# Patient Record
Sex: Female | Born: 1960 | Race: Black or African American | Hispanic: No | Marital: Married | Smoking: Never smoker
Health system: Southern US, Community
[De-identification: ages and names within clinical notes are randomized; demographics above are authoritative.]

## PROBLEM LIST (undated history)

## (undated) ENCOUNTER — Ambulatory Visit (HOSPITAL_COMMUNITY): Admission: EM | Payer: BC Managed Care – PPO

## (undated) DIAGNOSIS — K760 Fatty (change of) liver, not elsewhere classified: Secondary | ICD-10-CM

## (undated) DIAGNOSIS — K5900 Constipation, unspecified: Secondary | ICD-10-CM

## (undated) DIAGNOSIS — I219 Acute myocardial infarction, unspecified: Secondary | ICD-10-CM

## (undated) DIAGNOSIS — K219 Gastro-esophageal reflux disease without esophagitis: Secondary | ICD-10-CM

## (undated) DIAGNOSIS — M797 Fibromyalgia: Secondary | ICD-10-CM

## (undated) DIAGNOSIS — E785 Hyperlipidemia, unspecified: Secondary | ICD-10-CM

## (undated) DIAGNOSIS — I10 Essential (primary) hypertension: Secondary | ICD-10-CM

## (undated) DIAGNOSIS — T7840XA Allergy, unspecified, initial encounter: Secondary | ICD-10-CM

## (undated) HISTORY — DX: Essential (primary) hypertension: I10

## (undated) HISTORY — PX: ABDOMINAL HYSTERECTOMY: SHX81

## (undated) HISTORY — DX: Fatty (change of) liver, not elsewhere classified: K76.0

## (undated) HISTORY — DX: Acute myocardial infarction, unspecified: I21.9

## (undated) HISTORY — DX: Gastro-esophageal reflux disease without esophagitis: K21.9

## (undated) HISTORY — DX: Hyperlipidemia, unspecified: E78.5

## (undated) HISTORY — PX: WISDOM TOOTH EXTRACTION: SHX21

## (undated) HISTORY — DX: Allergy, unspecified, initial encounter: T78.40XA

## (undated) HISTORY — DX: Constipation, unspecified: K59.00

## (undated) HISTORY — DX: Fibromyalgia: M79.7

---

## 1998-03-24 ENCOUNTER — Inpatient Hospital Stay (HOSPITAL_COMMUNITY): Admission: AD | Admit: 1998-03-24 | Discharge: 1998-03-24 | Payer: Self-pay | Admitting: Obstetrics & Gynecology

## 1998-11-17 ENCOUNTER — Emergency Department (HOSPITAL_COMMUNITY): Admission: EM | Admit: 1998-11-17 | Discharge: 1998-11-17 | Payer: Self-pay | Admitting: Emergency Medicine

## 1998-11-17 ENCOUNTER — Encounter: Payer: Self-pay | Admitting: Emergency Medicine

## 1998-11-19 ENCOUNTER — Emergency Department (HOSPITAL_COMMUNITY): Admission: EM | Admit: 1998-11-19 | Discharge: 1998-11-19 | Payer: Self-pay | Admitting: Internal Medicine

## 1998-11-19 ENCOUNTER — Encounter: Payer: Self-pay | Admitting: Internal Medicine

## 1999-05-16 ENCOUNTER — Encounter: Payer: Self-pay | Admitting: Emergency Medicine

## 1999-05-16 ENCOUNTER — Emergency Department (HOSPITAL_COMMUNITY): Admission: EM | Admit: 1999-05-16 | Discharge: 1999-05-16 | Payer: Self-pay | Admitting: Emergency Medicine

## 2000-08-30 ENCOUNTER — Other Ambulatory Visit: Admission: RE | Admit: 2000-08-30 | Discharge: 2000-08-30 | Payer: Self-pay | Admitting: Obstetrics & Gynecology

## 2000-12-10 ENCOUNTER — Emergency Department (HOSPITAL_COMMUNITY): Admission: EM | Admit: 2000-12-10 | Discharge: 2000-12-10 | Payer: Self-pay | Admitting: Emergency Medicine

## 2000-12-17 ENCOUNTER — Emergency Department (HOSPITAL_COMMUNITY): Admission: EM | Admit: 2000-12-17 | Discharge: 2000-12-17 | Payer: Self-pay | Admitting: Emergency Medicine

## 2002-12-19 ENCOUNTER — Encounter: Admission: RE | Admit: 2002-12-19 | Discharge: 2002-12-19 | Payer: Self-pay

## 2003-01-12 ENCOUNTER — Encounter: Admission: RE | Admit: 2003-01-12 | Discharge: 2003-01-12 | Payer: Self-pay | Admitting: Gastroenterology

## 2003-01-12 ENCOUNTER — Encounter: Payer: Self-pay | Admitting: Gastroenterology

## 2003-02-25 ENCOUNTER — Ambulatory Visit (HOSPITAL_COMMUNITY): Admission: RE | Admit: 2003-02-25 | Discharge: 2003-02-25 | Payer: Self-pay | Admitting: Gastroenterology

## 2004-01-02 ENCOUNTER — Emergency Department (HOSPITAL_COMMUNITY): Admission: EM | Admit: 2004-01-02 | Discharge: 2004-01-02 | Payer: Self-pay | Admitting: Emergency Medicine

## 2004-03-08 ENCOUNTER — Emergency Department (HOSPITAL_COMMUNITY): Admission: EM | Admit: 2004-03-08 | Discharge: 2004-03-08 | Payer: Self-pay | Admitting: Family Medicine

## 2004-03-13 ENCOUNTER — Emergency Department (HOSPITAL_COMMUNITY): Admission: EM | Admit: 2004-03-13 | Discharge: 2004-03-13 | Payer: Self-pay | Admitting: Emergency Medicine

## 2004-03-17 ENCOUNTER — Emergency Department (HOSPITAL_COMMUNITY): Admission: EM | Admit: 2004-03-17 | Discharge: 2004-03-17 | Payer: Self-pay | Admitting: Family Medicine

## 2004-07-30 ENCOUNTER — Emergency Department (HOSPITAL_COMMUNITY): Admission: EM | Admit: 2004-07-30 | Discharge: 2004-07-30 | Payer: Self-pay | Admitting: Emergency Medicine

## 2004-10-15 ENCOUNTER — Encounter: Admission: RE | Admit: 2004-10-15 | Discharge: 2004-10-15 | Payer: Self-pay | Admitting: Neurology

## 2004-12-26 ENCOUNTER — Ambulatory Visit (HOSPITAL_COMMUNITY): Admission: RE | Admit: 2004-12-26 | Discharge: 2004-12-26 | Payer: Self-pay | Admitting: Neurology

## 2005-01-04 ENCOUNTER — Emergency Department (HOSPITAL_COMMUNITY): Admission: EM | Admit: 2005-01-04 | Discharge: 2005-01-04 | Payer: Self-pay | Admitting: Emergency Medicine

## 2006-02-03 ENCOUNTER — Emergency Department (HOSPITAL_COMMUNITY): Admission: EM | Admit: 2006-02-03 | Discharge: 2006-02-03 | Payer: Self-pay | Admitting: Emergency Medicine

## 2006-03-10 ENCOUNTER — Emergency Department (HOSPITAL_COMMUNITY): Admission: EM | Admit: 2006-03-10 | Discharge: 2006-03-10 | Payer: Self-pay | Admitting: Family Medicine

## 2006-03-19 ENCOUNTER — Emergency Department (HOSPITAL_COMMUNITY): Admission: EM | Admit: 2006-03-19 | Discharge: 2006-03-19 | Payer: Self-pay | Admitting: Emergency Medicine

## 2006-03-23 ENCOUNTER — Ambulatory Visit: Payer: Self-pay | Admitting: Cardiology

## 2006-04-06 ENCOUNTER — Ambulatory Visit: Payer: Self-pay

## 2006-12-18 ENCOUNTER — Emergency Department (HOSPITAL_COMMUNITY): Admission: EM | Admit: 2006-12-18 | Discharge: 2006-12-18 | Payer: Self-pay | Admitting: Family Medicine

## 2007-03-25 ENCOUNTER — Ambulatory Visit: Payer: Self-pay | Admitting: Cardiology

## 2007-04-30 ENCOUNTER — Ambulatory Visit: Payer: Self-pay

## 2007-08-13 ENCOUNTER — Emergency Department (HOSPITAL_COMMUNITY): Admission: EM | Admit: 2007-08-13 | Discharge: 2007-08-13 | Payer: Self-pay | Admitting: Emergency Medicine

## 2007-10-20 ENCOUNTER — Emergency Department (HOSPITAL_COMMUNITY): Admission: EM | Admit: 2007-10-20 | Discharge: 2007-10-20 | Payer: Self-pay | Admitting: Emergency Medicine

## 2008-09-01 ENCOUNTER — Emergency Department (HOSPITAL_COMMUNITY): Admission: EM | Admit: 2008-09-01 | Discharge: 2008-09-01 | Payer: Self-pay | Admitting: Emergency Medicine

## 2009-08-20 ENCOUNTER — Encounter: Admission: RE | Admit: 2009-08-20 | Discharge: 2009-08-20 | Payer: Self-pay | Admitting: Gastroenterology

## 2009-09-02 ENCOUNTER — Ambulatory Visit (HOSPITAL_COMMUNITY): Admission: RE | Admit: 2009-09-02 | Discharge: 2009-09-02 | Payer: Self-pay | Admitting: Gastroenterology

## 2009-09-15 HISTORY — PX: COLONOSCOPY: SHX174

## 2009-12-13 ENCOUNTER — Observation Stay (HOSPITAL_COMMUNITY): Admission: EM | Admit: 2009-12-13 | Discharge: 2009-12-14 | Payer: Self-pay | Admitting: Emergency Medicine

## 2010-01-05 ENCOUNTER — Ambulatory Visit: Payer: Self-pay

## 2010-02-07 ENCOUNTER — Telehealth (INDEPENDENT_AMBULATORY_CARE_PROVIDER_SITE_OTHER): Payer: Self-pay | Admitting: *Deleted

## 2010-12-03 ENCOUNTER — Encounter: Payer: Self-pay | Admitting: Neurology

## 2010-12-04 ENCOUNTER — Encounter: Payer: Self-pay | Admitting: Neurology

## 2010-12-13 NOTE — Progress Notes (Signed)
  Faxed all Cardiac over to Joycelyn Schmid w/ [employer] to fax [phone] [facility]  February 07, 2010 9:39 AM

## 2011-01-29 LAB — VITAMIN B12: Vitamin B-12: 258 pg/mL (ref 211–911)

## 2011-01-29 LAB — DIFFERENTIAL
Basophils Absolute: 0 10*3/uL (ref 0.0–0.1)
Basophils Relative: 1 % (ref 0–1)
Eosinophils Absolute: 0.2 10*3/uL (ref 0.0–0.7)
Eosinophils Relative: 6 % — ABNORMAL HIGH (ref 0–5)
Lymphocytes Relative: 53 % — ABNORMAL HIGH (ref 12–46)
Lymphs Abs: 2 10*3/uL (ref 0.7–4.0)
Monocytes Absolute: 0.2 10*3/uL (ref 0.1–1.0)
Monocytes Relative: 6 % (ref 3–12)
Neutro Abs: 1.3 10*3/uL — ABNORMAL LOW (ref 1.7–7.7)
Neutrophils Relative %: 35 % — ABNORMAL LOW (ref 43–77)

## 2011-01-29 LAB — TROPONIN I: Troponin I: 0.05 ng/mL (ref 0.00–0.06)

## 2011-01-29 LAB — CBC
HCT: 31.2 % — ABNORMAL LOW (ref 36.0–46.0)
HCT: 42 % (ref 36.0–46.0)
Hemoglobin: 10.6 g/dL — ABNORMAL LOW (ref 12.0–15.0)
Hemoglobin: 14.1 g/dL (ref 12.0–15.0)
MCHC: 33.6 g/dL (ref 30.0–36.0)
MCV: 92 fL (ref 78.0–100.0)
Platelets: 37 10*3/uL — ABNORMAL LOW (ref 150–400)
RBC: 4.56 MIL/uL (ref 3.87–5.11)
RDW: 13.5 % (ref 11.5–15.5)
RDW: 13.6 % (ref 11.5–15.5)
WBC: 3.8 10*3/uL — ABNORMAL LOW (ref 4.0–10.5)

## 2011-01-29 LAB — IRON AND TIBC
Iron: 51 ug/dL (ref 42–135)
Saturation Ratios: 22 % (ref 20–55)
TIBC: 230 ug/dL — ABNORMAL LOW (ref 250–470)
UIBC: 179 ug/dL

## 2011-01-29 LAB — FOLATE: Folate: 17.1 ng/mL

## 2011-01-29 LAB — POCT CARDIAC MARKERS
CKMB, poc: <1 ng/mL — ABNORMAL LOW (ref 1.0–8.0)
CKMB, poc: <1 ng/mL — ABNORMAL LOW (ref 1.0–8.0)
Myoglobin, poc: 20 ng/mL (ref 12–200)
Myoglobin, poc: 24.8 ng/mL (ref 12–200)
Troponin i, poc: <0.05 ng/mL (ref 0.00–0.09)
Troponin i, poc: <0.05 ng/mL (ref 0.00–0.09)

## 2011-01-29 LAB — FERRITIN: Ferritin: 147 ng/mL (ref 10–291)

## 2011-01-29 LAB — FOLATE RBC: RBC Folate: 648 ng/mL — ABNORMAL HIGH (ref 180–600)

## 2011-01-29 LAB — BASIC METABOLIC PANEL
BUN: 8 mg/dL (ref 6–23)
CO2: 25 mEq/L (ref 19–32)
Calcium: 9.7 mg/dL (ref 8.4–10.5)
Chloride: 107 mEq/L (ref 96–112)
Creatinine, Ser: 0.61 mg/dL (ref 0.4–1.2)
GFR calc Af Amer: >60 mL/min (ref >60)
GFR calc non Af Amer: >60 mL/min (ref >60)
Glucose, Bld: 100 mg/dL — ABNORMAL HIGH (ref 70–99)
Potassium: 3.4 mEq/L — ABNORMAL LOW (ref 3.5–5.1)
Sodium: 141 mEq/L (ref 135–145)

## 2011-01-29 LAB — CK TOTAL AND CKMB (NOT AT ARMC)
CK, MB: 1.9 ng/mL (ref 0.3–4.0)
Relative Index: 0.9 (ref 0.0–2.5)
Total CK: 223 U/L — ABNORMAL HIGH (ref 7–177)

## 2011-01-29 LAB — RAPID URINE DRUG SCREEN, HOSP PERFORMED
Amphetamines: NOT DETECTED
Barbiturates: NOT DETECTED
Benzodiazepines: NOT DETECTED
Cocaine: NOT DETECTED
Opiates: POSITIVE — AB
Tetrahydrocannabinol: NOT DETECTED

## 2011-01-29 LAB — MAGNESIUM: Magnesium: 1.7 mg/dL (ref 1.5–2.5)

## 2011-01-29 LAB — DIRECT ANTIGLOBULIN TEST (NOT AT ARMC): DAT, IgG: NEGATIVE

## 2011-01-29 LAB — TSH: TSH: 1.065 u[IU]/mL (ref 0.350–4.500)

## 2011-01-29 LAB — ANA: Anti Nuclear Antibody(ANA): NEGATIVE

## 2011-01-29 LAB — CARDIAC PANEL(CRET KIN+CKTOT+MB+TROPI): CK, MB: 0.4 ng/mL (ref 0.3–4.0)

## 2011-01-29 LAB — RETICULOCYTES: Retic Count, Absolute: 18.8 10*3/uL — ABNORMAL LOW (ref 19.0–186.0)

## 2011-01-29 LAB — LACTATE DEHYDROGENASE: LDH: 136 U/L (ref 94–250)

## 2011-02-01 LAB — CARDIAC PANEL(CRET KIN+CKTOT+MB+TROPI)
Relative Index: INVALID (ref 0.0–2.5)
Total CK: 51 U/L (ref 7–177)

## 2011-02-01 LAB — COMPREHENSIVE METABOLIC PANEL
ALT: 20 U/L (ref 0–35)
AST: 18 U/L (ref 0–37)
CO2: 27 mEq/L (ref 19–32)
Calcium: 8.4 mg/dL (ref 8.4–10.5)
GFR calc Af Amer: >60 mL/min (ref >60)
Sodium: 137 mEq/L (ref 135–145)
Total Protein: 5.7 g/dL — ABNORMAL LOW (ref 6.0–8.3)

## 2011-03-28 NOTE — Assessment & Plan Note (Signed)
Sherry Daniels                            CARDIOLOGY OFFICE NOTE   Sherry Daniels, Sherry Daniels                      MRN:          [id number]  DATE:03/25/2007                            DOB:          22-May-1961    Sherry Daniels is a very pleasant, 50 year old female who I initially saw in  May 2007 secondary to atypical chest pain and a syncopal episode (felt  secondary to recent initiation of blood pressure medicines and  hypotension). At that time, we scheduled her to have a Myoview which was  performed on Apr 06, 2006. Her ejection fraction was 63%. There was a  mild reversible defect in the distal anterior wall that was felt likely  due to shifting breast attenuation but mild ischemia could not be  excluded. She did return for followup. However she apparently has  recently had more chest pain and we were asked to further evaluate. Note  her chest pain is not exertional nor is it pleuritic or positional or  related to food. She states it is predominantly related to stress. There  is no nausea, vomiting, diaphoresis or shortness of breath. It lasts for  several minutes and resolves spontaneously. Note she does not have  exertional chest pain. She also has some dyspnea on exertion which can  be intermittent. She also complains of intermittent dizziness but no  associated palpitations and there has been no recurrent syncope. Her  blood pressure has been up and down by her report.   MEDICATIONS:  1. Nexium 40 mg p.o. daily.  2. Proventil.  3. Multivitamin.  4. Aspirin daily.   PHYSICAL EXAMINATION:  VITAL SIGNS:  Blood pressure 130/90 and a pulse  of 68. She weighs 164 pounds.  NECK:  Supple with no bruits.  CHEST:  Clear.  CARDIOVASCULAR:  Regular rate and rhythm.  EXTREMITIES:  Show no edema.   Her electrocardiogram  today shows a sinus rhythm at a rate of 68. There  were no significant ST changes noted.   DIAGNOSES:  1. Atypical chest pain - she did  have a Myoview approximately 1 year      ago that showed a question of mild anterior ischemia although it is      felt most likely to be more related to a shifting breast      attenuation. We will plan to repeat that. If it shows no ischemia      then we will not pursue further cardiac workup. I think her      symptoms are very atypical for ischemia.  2. Probably hypertension - her blood pressure is mildly elevated today      and she states it is starting to trend up. We talked about risk      factor modification today including diet, exercise and avoiding      sodium. She will begin with these measures. If her blood pressure      remains elevated then she will most likely need low dose      antihypertensive and I will leave that to her primary care  physician.  3. Gastroesophageal reflux disease.  4. Irritable bowel syndrome.  5. Fibromyalgia - per her primary care physician.   We will see her back on a p.r.n. basis pending the results of her  Myoview.     Sherry Frieze Jens Som, MD, [facility]  Electronically Signed    BSC/MedQ  DD: 03/25/2007  DT: 03/25/2007  Job #: [id number]   cc:   Olena Leatherwood [facility]

## 2011-03-31 NOTE — Op Note (Signed)
NAME:  Sherry Daniels, Sherry Daniels                         ACCOUNT NO.:  [id number]   MEDICAL RECORD NO.:  [id number]                   PATIENT TYPE:  AMB   LOCATION:  ENDO                                 FACILITY:  [facility]   PHYSICIAN:  Anselmo Rod, M.D.               DATE OF BIRTH:  11-Aug-1961   DATE OF PROCEDURE:  02/25/2003  DATE OF DISCHARGE:                                 OPERATIVE REPORT   PROCEDURE:  Screening colonoscopy.   ENDOSCOPIST:  Anselmo Rod, M.D.   INSTRUMENT USED:  Olympus video colonoscope (adjustable pediatric).   INDICATION FOR PROCEDURE:  A 50 year old African-American female with a  history of colon cancer and breast caner in the family and guaiac-positive  stools.  Rule out colonic polyps, masses, etc.   PREPROCEDURE PREPARATION:  Informed consent was procured from the patient.  The patient fasted for eight hours prior to the procedure and prepped with a  bottle of Gatorade and Miralax the night prior to the procedure.   PREPROCEDURE PHYSICAL:  VITAL SIGNS:  The patient had stable vital signs.  NECK:  Supple.  CHEST:  Clear to auscultation.  S1, S2 regular.  ABDOMEN:  Soft with normal bowel sounds.   DESCRIPTION OF PROCEDURE:  The patient was placed in the left lateral  decubitus position and sedated with 100 mg of Demerol and 10 mg of Versed  intravenously.  Once the patient was adequately sedate and maintained on low-  flow oxygen and continuous cardiac monitoring, the Olympus video colonoscope  was advanced form the rectum to the cecum and terminal ileum without  difficulty.  The patient had a healthy-appearing colon with no masses,  polyps, erosions, ulcerations, or diverticula.  A small internal hemorrhoid  was seen on retroflexion in the rectum.  No other source of bleeding could  be identified.  The patient tolerated the procedure well without  complication.   IMPRESSION:  Normal colonoscopy up to the terminal ileum except for small,  nonbleeding  internal hemorrhoid.   RECOMMENDATIONS:  1. Considering her family history of colon cancer, repeat colorectal cancer     screening is recommended in the next     five years unless the patient develops any abnormal symptoms in the     interim.  2. Outpatient follow-up for repeat guaiac testing.  3. Further evaluation for dysphagia will be done on an outpatient basis as     well.                                                 Anselmo Rod, M.D.    JNM/MEDQ  D:  02/25/2003  T:  02/25/2003  Job:  [id number]   cc:   Christella Noa, M.D.  [address] [place]  [place], [place] [address]  Fax: [phone]

## 2011-05-08 ENCOUNTER — Inpatient Hospital Stay (INDEPENDENT_AMBULATORY_CARE_PROVIDER_SITE_OTHER)
Admission: RE | Admit: 2011-05-08 | Discharge: 2011-05-08 | Disposition: A | Discharge status: Home / Self Care | Payer: Self-pay | Source: Ambulatory Visit | Attending: Family Medicine | Admitting: Family Medicine

## 2011-05-08 DIAGNOSIS — IMO0001 Reserved for inherently not codable concepts without codable children: Secondary | ICD-10-CM

## 2011-05-15 ENCOUNTER — Encounter: Payer: Self-pay | Admitting: Family Medicine

## 2011-05-15 DIAGNOSIS — I10 Essential (primary) hypertension: Secondary | ICD-10-CM | POA: Insufficient documentation

## 2011-05-15 DIAGNOSIS — T7840XA Allergy, unspecified, initial encounter: Secondary | ICD-10-CM | POA: Insufficient documentation

## 2011-08-14 LAB — POCT I-STAT, CHEM 8
Calcium, Ion: 1.27
Chloride: 107
Glucose, Bld: 90
HCT: 35 — ABNORMAL LOW
Hemoglobin: 11.9 — ABNORMAL LOW

## 2011-08-14 LAB — DIFFERENTIAL
Basophils Absolute: 0.1
Basophils Relative: 1
Eosinophils Relative: 4
Monocytes Absolute: 0.3

## 2011-08-14 LAB — CBC
HCT: 34.4 — ABNORMAL LOW
Hemoglobin: 11.5 — ABNORMAL LOW
MCHC: 33.4
RDW: 13.2

## 2011-08-16 ENCOUNTER — Inpatient Hospital Stay (HOSPITAL_COMMUNITY)
Admission: RE | Admit: 2011-08-16 | Discharge: 2011-08-16 | Disposition: A | Discharge status: Home / Self Care | Payer: Self-pay | Source: Ambulatory Visit | Attending: Family Medicine | Admitting: Family Medicine

## 2011-08-21 LAB — DIFFERENTIAL
Basophils Relative: 1
Eosinophils Absolute: 0.1 — ABNORMAL LOW
Eosinophils Relative: 2
Monocytes Absolute: 0.4
Monocytes Relative: 7
Neutrophils Relative %: 53

## 2011-08-21 LAB — CBC
HCT: 36.2
MCHC: 33.4
MCV: 90.1
RBC: 4.01

## 2011-08-21 LAB — BASIC METABOLIC PANEL
CO2: 28
Chloride: 101
Creatinine, Ser: 0.73
GFR calc Af Amer: >60

## 2011-08-22 ENCOUNTER — Inpatient Hospital Stay (INDEPENDENT_AMBULATORY_CARE_PROVIDER_SITE_OTHER)
Admission: RE | Admit: 2011-08-22 | Discharge: 2011-08-22 | Disposition: A | Discharge status: Home / Self Care | Payer: Self-pay | Source: Ambulatory Visit | Attending: Family Medicine | Admitting: Family Medicine

## 2011-08-22 DIAGNOSIS — M25519 Pain in unspecified shoulder: Secondary | ICD-10-CM

## 2011-08-22 DIAGNOSIS — S40019A Contusion of unspecified shoulder, initial encounter: Secondary | ICD-10-CM

## 2011-10-12 ENCOUNTER — Encounter: Payer: Self-pay | Admitting: Medical

## 2011-10-12 ENCOUNTER — Ambulatory Visit (INDEPENDENT_AMBULATORY_CARE_PROVIDER_SITE_OTHER): Payer: BC Managed Care – PPO | Admitting: Medical

## 2011-10-12 VITALS — BP 120/70 | HR 68 | Temp 98.1°F | Resp 16 | Wt 148.0 lb

## 2011-10-12 DIAGNOSIS — E782 Mixed hyperlipidemia: Secondary | ICD-10-CM | POA: Insufficient documentation

## 2011-10-12 DIAGNOSIS — IMO0001 Reserved for inherently not codable concepts without codable children: Secondary | ICD-10-CM

## 2011-10-12 DIAGNOSIS — M797 Fibromyalgia: Secondary | ICD-10-CM

## 2011-10-12 DIAGNOSIS — I10 Essential (primary) hypertension: Secondary | ICD-10-CM | POA: Insufficient documentation

## 2011-10-12 DIAGNOSIS — M2550 Pain in unspecified joint: Secondary | ICD-10-CM

## 2011-10-12 DIAGNOSIS — E785 Hyperlipidemia, unspecified: Secondary | ICD-10-CM

## 2011-10-12 DIAGNOSIS — K219 Gastro-esophageal reflux disease without esophagitis: Secondary | ICD-10-CM | POA: Insufficient documentation

## 2011-10-12 MED ORDER — DEXLANSOPRAZOLE 60 MG PO CPDR: 60.0000 mg | DELAYED_RELEASE_CAPSULE | Freq: Every day | ORAL | Status: DC

## 2011-10-12 MED ORDER — DULOXETINE HCL 60 MG PO CPEP: 60.0000 mg | ORAL_CAPSULE | Freq: Every day | ORAL | Status: DC

## 2011-10-12 MED ORDER — PRAVASTATIN SODIUM 40 MG PO TABS: 40.0000 mg | ORAL_TABLET | Freq: Every day | ORAL | Status: DC

## 2011-10-12 MED ORDER — HYDROCHLOROTHIAZIDE 12.5 MG PO CAPS: 12.5000 mg | ORAL_CAPSULE | Freq: Every day | ORAL | Status: DC

## 2011-10-12 NOTE — Progress Notes (Signed)
Subjective:   HPI  Sherry Daniels is a 50 y.o. female who presents as a new patient.  Was seeing [facility] prior.  She notes hx/o fluctuating blood pressure.  She can go routinely with normal pressure and then can have fluctuations that are high.  She has been out of her medication for 2 weeks and it has been normal the last 2 weeks.  She notes that BP can be up to 150/110 at times, but with stress.  Needs refills on her other medications as well.  She also notes hx/o fibromyalgia.  Last PCM put her on Xanax prn for fibromyalgia, but she doesn't use this regularly.  She aches all over.    She does have a lot of stress.  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   Lately been having pains in low back, pain in both knees, and some pain in both hips x months. She exercises regularly.  She note bad fall at [employer] in October.  Been having low back and buttock pain since then.  Runs 2-3 miles daily.  No other aggravating or relieving factors.    No other c/o.  The following portions of the patient's history were reviewed and updated as appropriate: allergies, current medications, past family history, past medical history, past social history, past surgical history and problem list.  Past Medical History  Diagnosis Date  . Allergy   . Hypertension   . Hyperlipidemia   . Asthma   . Migraine   . Nonalcoholic fatty liver disease   . GERD (gastroesophageal reflux disease)   . Fibromyalgia   . Chest pain 11/2009    overnight hospitalization   Review of Systems Constitutional: -fever, -chills, -sweats, -unexpected -weight change,-fatigue ENT: -runny nose, -ear pain, -sore throat Cardiology:  -chest pain, -palpitations, -edema Respiratory: -cough, -shortness of breath, -wheezing Gastroenterology: -abdominal pain, -nausea, -vomiting, -diarrhea, +constipation Hematology: -bleeding or bruising problems Musculoskeletal:  -arthralgias, -myalgias, -joint swelling, +back pain Ophthalmology: -vision changes Urology: -dysuria, -difficulty urinating, -hematuria, -urinary frequency, -urgency Neurology: -headache, -weakness, +tingling, +numbness    Objective:   Physical Exam  Filed Vitals:   10/12/11 1531  BP: 120/70  Pulse: 68  Temp: 98.1 F (36.7 C)  Resp: 16    General appearance: alert, no distress, WD/WN, black female, looks younger than stated age  Oral cavity: MMM, no lesions Neck: supple, no lymphadenopathy, no thyromegaly, no masses, no bruits Heart: RRR, normal S1, S2, no murmurs Lungs: CTA bilaterally, no wheezes, rhonchi, or rales Abdomen: +bs, soft, non tender, non distended, no masses, no hepatomegaly, no splenomegaly Pulses: 2+ symmetric, upper and lower extremities, normal cap refill MSK:    Assessment and Plan :    Encounter Diagnoses  Name Primary?  . Essential hypertension, benign Yes  . Hyperlipidemia   . GERD (gastroesophageal reflux disease)   . Fibromyalgia   . Polyarthralgia    HTN - controlled, refilled medication  Hyperlipidemia - c/t same medication, records request from prior PCM  GERD - c/t same medications  Fibomroyaliga - trial of Cymbalta.   Gave symptoms of 30mg  trial for 1 week, then increase to 60mg  daily.   Recheck 58mo.  Polyarthralgia - discussed possible etiologies.  She does have family hx/o RA.  We will request prior records.  For now begin Aleve and Capsaicin cream OTC.  C/t regularly exercise, but don't run eeveryday.   Some of her pain could rrepresentoveruse  injury in regards to knees and pelvis, but can't rule out other etiology.  Follow-up 41mo.

## 2011-10-12 NOTE — Patient Instructions (Addendum)
Begin Aleve OTC once to twice daily for arthritis.  Consider OTC Capsacian cream topically for joint pains.  Continue regular exercise.    We will request prior records.    Lets see you back in 1 month for recheck.   Fibromyalgia Fibromyalgia is a disorder that is often misunderstood. It is associated with muscular pains and tenderness that comes and goes. It is often associated with fatigue and sleep disturbances. Though it tends to be long-lasting, fibromyalgia is not life-threatening. CAUSES  The exact cause of fibromyalgia is unknown. People with certain gene types are predisposed to developing fibromyalgia and other conditions. Certain factors can play a role as triggers, such as:  Spine disorders.   Arthritis.   Severe injury (trauma) and other physical stressors.   Emotional stressors.  SYMPTOMS   The main symptom is pain and stiffness in the muscles and joints, which can vary over time.   Sleep and fatigue problems.  Other related symptoms may include:  Bowel and bladder problems.   Headaches.   Visual problems.   Problems with odors and noises.   Depression or mood changes.   Painful periods (dysmenorrhea).   Dryness of the skin or eyes.  DIAGNOSIS  There are no specific tests for diagnosing fibromyalgia. Patients can be diagnosed accurately from the specific symptoms they have. The diagnosis is made by determining that nothing else is causing the problems. TREATMENT  There is no cure. Management includes medicines and an active, healthy lifestyle. The goal is to enhance physical fitness, decrease pain, and improve sleep. HOME CARE INSTRUCTIONS   Only take over-the-counter or prescription medicines as directed by your caregiver. Sleeping pills, tranquilizers, and pain medicines may make your problems worse.   Low-impact aerobic exercise is very important and advised for treatment. At first, it may seem to make pain worse. Gradually increasing your tolerance will  overcome this feeling.   Learning relaxation techniques and how to control stress will help you. Biofeedback, visual imagery, hypnosis, muscle relaxation, yoga, and meditation are all options.   Anti-inflammatory medicines and physical therapy may provide short-term help.   Acupuncture or massage treatments may help.   Take muscle relaxant medicines as suggested by your caregiver.   Avoid stressful situations.   Plan a healthy lifestyle. This includes your diet, sleep, rest, exercise, and friends.   Find and practice a hobby you enjoy.   Join a fibromyalgia support group for interaction, ideas, and sharing advice. This may be helpful.  SEEK MEDICAL CARE IF:  You are not having good results or improvement from your treatment. FOR MORE INFORMATION  National Fibromyalgia Association: www.fmaware.org Arthritis Foundation: www.arthritis.org Document Released: 10/30/2005 Document Revised: 07/12/2011 Document Reviewed: 02/09/2010 [facility] Patient Information 2012 [place], [place].

## 2011-10-23 ENCOUNTER — Other Ambulatory Visit: Payer: Self-pay | Admitting: Medical

## 2011-10-23 ENCOUNTER — Telehealth: Payer: Self-pay | Admitting: Family Medicine

## 2011-10-23 MED ORDER — ESOMEPRAZOLE MAGNESIUM 40 MG PO CPDR: DELAYED_RELEASE_CAPSULE | ORAL | Status: DC

## 2011-10-23 NOTE — Telephone Encounter (Signed)
SHANE, I SPOKE WITH THE PATIENT AND SHE STATES THAT SHE TAKEN NEXIUM BEFORE SO SHE WOULD BE OKAY WITH TAKING NEXIUM AGAIN. COULD YOU PLEASE SEND THE RX TO HER PHARMACY. CLS

## 2011-10-23 NOTE — Telephone Encounter (Signed)
Message copied by Janeice Robinson on Mon Oct 23, 2011  8:31 AM ------      Message from: Aleen Campi, DAVID S      Created: Thu Oct 19, 2011  3:22 PM       Insurance is declining to cover Dexilant.  She just came recently as a new patient.   What else has she been on prior?  Insurance is wanting to pay for Nexium instead.  Has she been on this?  Is she ok with trying Nexium?

## 2011-11-02 ENCOUNTER — Telehealth: Payer: Self-pay | Admitting: Family Medicine

## 2011-11-06 ENCOUNTER — Encounter: Payer: Self-pay | Admitting: Internal Medicine

## 2011-11-08 ENCOUNTER — Encounter: Payer: Self-pay | Admitting: Medical

## 2011-11-08 ENCOUNTER — Ambulatory Visit (INDEPENDENT_AMBULATORY_CARE_PROVIDER_SITE_OTHER): Payer: BC Managed Care – PPO | Admitting: Medical

## 2011-11-08 VITALS — BP 110/70 | HR 80 | Temp 98.2°F | Resp 12 | Wt 144.0 lb

## 2011-11-08 DIAGNOSIS — K219 Gastro-esophageal reflux disease without esophagitis: Secondary | ICD-10-CM

## 2011-11-08 DIAGNOSIS — M797 Fibromyalgia: Secondary | ICD-10-CM

## 2011-11-08 DIAGNOSIS — E785 Hyperlipidemia, unspecified: Secondary | ICD-10-CM

## 2011-11-08 DIAGNOSIS — K7689 Other specified diseases of liver: Secondary | ICD-10-CM

## 2011-11-08 DIAGNOSIS — K760 Fatty (change of) liver, not elsewhere classified: Secondary | ICD-10-CM | POA: Insufficient documentation

## 2011-11-08 DIAGNOSIS — I10 Essential (primary) hypertension: Secondary | ICD-10-CM

## 2011-11-08 DIAGNOSIS — IMO0001 Reserved for inherently not codable concepts without codable children: Secondary | ICD-10-CM

## 2011-11-08 DIAGNOSIS — M2550 Pain in unspecified joint: Secondary | ICD-10-CM

## 2011-11-08 MED ORDER — AMITRIPTYLINE HCL 10 MG PO TABS: 10.0000 mg | ORAL_TABLET | Freq: Every day | ORAL | Status: DC

## 2011-11-08 NOTE — Progress Notes (Signed)
Subjective:   HPI  Sherry Daniels is a 50 y.o. female who presents for recheck.  I saw her recently as a new patient for multiple concerns.  Was seeing [facility] prior.  She also notes hx/o fibromyalgia.  Last PCM put her on Xanax prn for fibromyalgia, but she doesn't use this regularly.  She aches all over. Last visit I advised trial of Cymbalta but she decided not to use this since her brother apparently had suicidal ideation on this prior.  She does have a lot of stress.  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   Lately been having pains in low back, pain in both knees, and some pain in both hips x months. She exercises regularly.  She note bad fall at [employer] in October.  Been having low back and buttock pain since then.  Runs 2-3 miles daily.  No other aggravating or relieving factors.  Since last visit has been using Aleve BID and Capsaicin cream which helps.  She has also used muscle relaxers in the past for myalgias.      In general, her last mammogram was 05/2010.  Last pap smear 2 years ago.  Sees Dr. Molly Maduro Wean/gynecology.    She is concerned about Lupus or other autoimmune issues.  She thinks she has several lupus symptoms including fatigue, subjective fevers, weight loss, joint pains including knees, elbows, wrists bilat, gets tingling in fingers and toes, rash on face.  She notes hx/o migraines, and still gets headaches regularly.  She wonders about environmental exposures growing up.  She notes that other girls her age that grew up in her neighborhood all had things wrong with them including MS, or other more rare conditions.    No other aggravating or relieving factors.    No other c/o.  The following portions of the patient's history were reviewed and updated as appropriate: allergies, current medications, past family history, past medical history, past social history, past surgical history and problem  list.  Past Medical History  Diagnosis Date  . Allergy   . Hypertension   . Hyperlipidemia   . Asthma   . Migraine   . Nonalcoholic fatty liver disease   . GERD (gastroesophageal reflux disease)   . Fibromyalgia   . Chest pain 11/2009    overnight hospitalization    Review of Systems Constitutional: -fever, -chills, +sweats, -unexpected -weight change,+fatigue ENT: -runny nose, -ear pain, -sore throat Cardiology:  -chest pain, -palpitations, -edema Respiratory: -cough, -shortness of breath, -wheezing Gastroenterology: -abdominal pain, -nausea, -vomiting, -diarrhea, -constipation Hematology: -bleeding or bruising problems Musculoskeletal: +arthralgias, +myalgias, -joint swelling, +back pain Ophthalmology: +vision changes Urology: -dysuria, -difficulty urinating, -hematuria, -urinary frequency, -urgency Neurology: +headache, -weakness, -tingling, -numbness    Objective:   Physical Exam  Filed Vitals:   11/08/11 1409  BP: 110/70  Pulse: 80  Temp: 98.2 F (36.8 C)  Resp: 12    General appearance: alert, no distress, WD/WN, lean black female HEENT: normocephalic, sclerae anicteric, TMs pearly, nares patent, no discharge or erythema, pharynx normal Oral cavity: MMM, no lesions Neck: supple, no lymphadenopathy, no thyromegaly, no masses, no bruits Heart: RRR, normal S1, S2, no murmurs Lungs: CTA bilaterally, no wheezes, rhonchi, or rales Abdomen: +bs, soft, non tender, non distended, no masses, no hepatomegaly, no splenomegaly Back: tender throughout, but no scoliosis MSK: mild generalized tenderness, bony abnormality of left medial knee, but otherwise no joint swelling, no asymmetry  or obvious deformity Pulses: 2+ symmetric, upper and lower extremities, normal cap refill Neuro: CN2-12 intact, nonfocal exam  Assessment and Plan :    Encounter Diagnoses  Name Primary?  . Hyperlipidemia Yes  . Essential hypertension, benign   . Nonalcoholic fatty liver disease   .  Polyarthralgia   . Fibromyalgia   . GERD (gastroesophageal reflux disease)    I reviewed her recent records that came in.   Hyperlipidemia - return for fasting labs, c/t same medications  HTN - controlled on current medication  Fatty liver disease - labs today  fibromyalgia and migraines - trial of Amitriptyline.  C/t regular exercise.   Polyarthralgia - Sed rate and ANA today.  GERD using Nexium and doing ok on this.  Follow-up tomorrow for fasting labs.

## 2011-11-09 ENCOUNTER — Other Ambulatory Visit: Payer: BC Managed Care – PPO

## 2011-11-10 ENCOUNTER — Other Ambulatory Visit (INDEPENDENT_AMBULATORY_CARE_PROVIDER_SITE_OTHER): Payer: BC Managed Care – PPO

## 2011-11-10 DIAGNOSIS — K760 Fatty (change of) liver, not elsewhere classified: Secondary | ICD-10-CM

## 2011-11-10 DIAGNOSIS — K7689 Other specified diseases of liver: Secondary | ICD-10-CM

## 2011-11-10 DIAGNOSIS — M2550 Pain in unspecified joint: Secondary | ICD-10-CM

## 2011-11-10 DIAGNOSIS — E785 Hyperlipidemia, unspecified: Secondary | ICD-10-CM

## 2011-11-10 DIAGNOSIS — I10 Essential (primary) hypertension: Secondary | ICD-10-CM

## 2011-11-10 LAB — POCT URINALYSIS DIPSTICK
Bilirubin, UA: NEGATIVE
Blood, UA: NEGATIVE
Glucose, UA: NEGATIVE
Ketones, UA: NEGATIVE
Spec Grav, UA: 1.015

## 2011-11-11 LAB — LIPID PANEL
Cholesterol: 162 mg/dL (ref 0–200)
HDL: 66 mg/dL (ref >39)
Total CHOL/HDL Ratio: 2.5 Ratio
VLDL: 16 mg/dL (ref 0–40)

## 2011-11-11 LAB — COMPREHENSIVE METABOLIC PANEL
AST: 20 U/L (ref 0–37)
Albumin: 3.8 g/dL (ref 3.5–5.2)
Alkaline Phosphatase: 31 U/L — ABNORMAL LOW (ref 39–117)
BUN: 13 mg/dL (ref 6–23)
Calcium: 8.7 mg/dL (ref 8.4–10.5)
Creat: 0.56 mg/dL (ref 0.50–1.10)
Glucose, Bld: 84 mg/dL (ref 70–99)
Potassium: 3.7 mEq/L (ref 3.5–5.3)

## 2011-11-11 LAB — CBC WITH DIFFERENTIAL/PLATELET
HCT: 35.6 % — ABNORMAL LOW (ref 36.0–46.0)
Hemoglobin: 11.4 g/dL — ABNORMAL LOW (ref 12.0–15.0)
Lymphs Abs: 1.6 10*3/uL (ref 0.7–4.0)
MCH: 29.9 pg (ref 26.0–34.0)
Monocytes Absolute: 0.2 10*3/uL (ref 0.1–1.0)
Monocytes Relative: 7 % (ref 3–12)
Neutro Abs: 0.9 10*3/uL — ABNORMAL LOW (ref 1.7–7.7)
Neutrophils Relative %: 32 % — ABNORMAL LOW (ref 43–77)
RBC: 3.81 MIL/uL — ABNORMAL LOW (ref 3.87–5.11)

## 2011-11-11 LAB — TSH: TSH: 0.999 u[IU]/mL (ref 0.350–4.500)

## 2011-11-13 ENCOUNTER — Telehealth: Payer: Self-pay | Admitting: Internal Medicine

## 2011-11-13 NOTE — Telephone Encounter (Signed)
Left message to call pt back

## 2011-11-13 NOTE — Telephone Encounter (Signed)
Message copied by Joslyn Hy on Mon Nov 13, 2011  9:54 AM ------      Message from: Jac Canavan      Created: Sat Nov 11, 2011  6:59 AM       Her labs show anemia - low white and red blood cells, low hemoglobin.  Her urine had trace protein.  Otherwise ALL other labs were normal.  Lets have her f/u at her convenience within the next week or so for recheck and additional labs regarding the anemia.              FYI for shane - colonoscopy 2010, prior EGD, followed by Dr. Loreta Ave, sees gyn, consider additional RA and autoimmune workup, anemia workup

## 2011-11-17 NOTE — Telephone Encounter (Signed)
PT WAS SWITCHED TO NEXIUM FOR INS TO PAY-LM

## 2011-11-20 ENCOUNTER — Other Ambulatory Visit: Payer: Self-pay

## 2011-11-20 ENCOUNTER — Emergency Department (HOSPITAL_COMMUNITY): Payer: BC Managed Care – PPO

## 2011-11-20 ENCOUNTER — Encounter (HOSPITAL_COMMUNITY): Payer: Self-pay

## 2011-11-20 ENCOUNTER — Emergency Department (HOSPITAL_COMMUNITY)
Admission: EM | Admit: 2011-11-20 | Discharge: 2011-11-20 | Disposition: A | Discharge status: Home / Self Care | Payer: BC Managed Care – PPO | Attending: Emergency Medicine | Admitting: Emergency Medicine

## 2011-11-20 DIAGNOSIS — R0789 Other chest pain: Secondary | ICD-10-CM | POA: Insufficient documentation

## 2011-11-20 DIAGNOSIS — E785 Hyperlipidemia, unspecified: Secondary | ICD-10-CM | POA: Insufficient documentation

## 2011-11-20 DIAGNOSIS — I10 Essential (primary) hypertension: Secondary | ICD-10-CM | POA: Insufficient documentation

## 2011-11-20 DIAGNOSIS — J45909 Unspecified asthma, uncomplicated: Secondary | ICD-10-CM | POA: Insufficient documentation

## 2011-11-20 DIAGNOSIS — IMO0001 Reserved for inherently not codable concepts without codable children: Secondary | ICD-10-CM | POA: Insufficient documentation

## 2011-11-20 DIAGNOSIS — K219 Gastro-esophageal reflux disease without esophagitis: Secondary | ICD-10-CM | POA: Insufficient documentation

## 2011-11-20 LAB — COMPREHENSIVE METABOLIC PANEL
ALT: 25 U/L (ref 0–35)
AST: 19 U/L (ref 0–37)
Albumin: 3.5 g/dL (ref 3.5–5.2)
Alkaline Phosphatase: 47 U/L (ref 39–117)
CO2: 30 mEq/L (ref 19–32)
Chloride: 99 mEq/L (ref 96–112)
Creatinine, Ser: 0.64 mg/dL (ref 0.50–1.10)
GFR calc non Af Amer: >90 mL/min (ref >90)
Potassium: 3.2 mEq/L — ABNORMAL LOW (ref 3.5–5.1)
Sodium: 137 mEq/L (ref 135–145)
Total Bilirubin: 0.2 mg/dL — ABNORMAL LOW (ref 0.3–1.2)

## 2011-11-20 LAB — CBC
HCT: 33.3 % — ABNORMAL LOW (ref 36.0–46.0)
Hemoglobin: 11.2 g/dL — ABNORMAL LOW (ref 12.0–15.0)
MCH: 30.3 pg (ref 26.0–34.0)
MCHC: 33.6 g/dL (ref 30.0–36.0)
MCV: 90 fL (ref 78.0–100.0)
Platelets: 203 10*3/uL (ref 150–400)
RBC: 3.7 MIL/uL — ABNORMAL LOW (ref 3.87–5.11)
RDW: 12.4 % (ref 11.5–15.5)
WBC: 4.9 10*3/uL (ref 4.0–10.5)

## 2011-11-20 LAB — CARDIAC PANEL(CRET KIN+CKTOT+MB+TROPI)
CK, MB: 1.3 ng/mL (ref 0.3–4.0)
Relative Index: INVALID (ref 0.0–2.5)
Total CK: 57 U/L (ref 7–177)
Troponin I: <0.3 ng/mL (ref <0.30)

## 2011-11-20 LAB — DIFFERENTIAL
Basophils Absolute: 0 10*3/uL (ref 0.0–0.1)
Basophils Relative: 1 % (ref 0–1)
Lymphocytes Relative: 46 % (ref 12–46)
Monocytes Absolute: 0.3 10*3/uL (ref 0.1–1.0)
Neutro Abs: 2.1 10*3/uL (ref 1.7–7.7)
Neutrophils Relative %: 44 % (ref 43–77)

## 2011-11-20 MED ORDER — ALBUTEROL SULFATE HFA 108 (90 BASE) MCG/ACT IN AERS: 2.0000 | INHALATION_SPRAY | Freq: Once | RESPIRATORY_TRACT | Status: DC

## 2011-11-20 NOTE — ED Notes (Signed)
Pt complains of chest tightness since last night

## 2011-11-20 NOTE — ED Provider Notes (Signed)
History     CSN: [id number]  Arrival date & time 11/20/11  1630   First MD Initiated Contact with Patient 11/20/11 2007     8:21 PM HPI Patient reports last night developed a chest tightness, and a chest pressure. Reports chest pressure was constant from is 24 hours. Reports chest tightness was waxing and waning. Denies any other associated symptoms such as shortness of breath, cough, nausea, vomiting, diaphoresis. Reports pain was nonreproducible with eating or exertion. Denies excessive exertion. Denies history of hormone therapy, recent trips, surgery, history of PE, or tachycardia. Patient is a 51 y.o. female presenting with chest pain. The history is provided by the patient.  Chest Pain The chest pain began yesterday. Chest pain occurs constantly. The chest pain is resolved. At its most intense, the pain is at 8/10. The pain is currently at 0/10. The severity of the pain is severe. The quality of the pain is described as tightness and pressure-like. The pain does not radiate. Chest pain is worsened by deep breathing (palpation). Pertinent negatives for primary symptoms include no fever, no fatigue, no shortness of breath, no cough, no wheezing, no palpitations, no abdominal pain, no nausea, no vomiting and no dizziness.  Pertinent negatives for associated symptoms include no claudication, no diaphoresis, no lower extremity edema, no numbness, no orthopnea and no weakness. She tried aspirin for the symptoms.  Her past medical history is significant for hyperlipidemia and hypertension.  Pertinent negatives for past medical history include no cancer, no diabetes, no MI, no PE, no strokes and no TIA.  Her family medical history is significant for heart disease in family, early MI in family and PE in family.     Past Medical History  Diagnosis Date  . Allergy   . Hypertension   . Hyperlipidemia   . Asthma   . Migraine   . Nonalcoholic fatty liver disease   . GERD (gastroesophageal reflux  disease)   . Fibromyalgia   . Chest pain 11/2009    overnight hospitalization    Past Surgical History  Procedure Date  . Abdominal hysterectomy     total  . Wisdom tooth extraction     History reviewed. No pertinent family history.  History  Substance Use Topics  . Smoking status: Never Smoker   . Smokeless tobacco: Not on file  . Alcohol Use: No    OB History    Grav Para Term Preterm Abortions TAB SAB Ect Mult Living                  Review of Systems  Constitutional: Negative for fever, diaphoresis and fatigue.  HENT: Negative for neck pain.   Respiratory: Negative for cough, shortness of breath and wheezing.   Cardiovascular: Positive for chest pain. Negative for palpitations, orthopnea, claudication and leg swelling.  Gastrointestinal: Negative for nausea, vomiting and abdominal pain.  Neurological: Negative for dizziness, weakness, numbness and headaches.  All other systems reviewed and are negative.    Allergies  Aspirin and Simvastatin  Home Medications   Current Outpatient Rx  Name Route Sig Dispense Refill  . ASPIRIN 325 MG PO TABS Oral Take 650 mg by mouth daily as needed. pain     . ESOMEPRAZOLE MAGNESIUM 40 MG PO CPDR  1 tablet 45 min before breakfast daily 30 capsule 5  . HYDROCHLOROTHIAZIDE 12.5 MG PO CAPS Oral Take 1 capsule (12.5 mg total) by mouth daily. 30 capsule 3  . NORTRIPTYLINE HCL 10 MG PO CAPS Oral Take  10 mg by mouth at bedtime.      Marland Kitchen PRAVASTATIN SODIUM 40 MG PO TABS Oral Take 1 tablet (40 mg total) by mouth daily. 30 tablet 3    BP 111/71  Pulse 70  Temp(Src) 98.1 F (36.7 C) (Oral)  Resp 18  SpO2 100%  Physical Exam  Vitals reviewed. Constitutional: She is oriented to person, place, and time. Vital signs are normal. She appears well-developed and well-nourished.  HENT:  Head: Normocephalic and atraumatic.  Eyes: Conjunctivae are normal. Pupils are equal, round, and reactive to light.  Neck: Normal range of motion. Neck  supple.  Cardiovascular: Normal rate, regular rhythm and normal heart sounds.  Exam reveals no friction rub.   No murmur heard. Pulmonary/Chest: Effort normal and breath sounds normal. She has no wheezes. She has no rhonchi. She has no rales. She exhibits tenderness ( left substernal tenderness with palpation is mild).  Abdominal: Soft. Bowel sounds are normal. She exhibits no distension and no mass. There is no tenderness. There is no rebound and no guarding.  Musculoskeletal: Normal range of motion.  Neurological: She is alert and oriented to person, place, and time. Coordination normal.  Skin: Skin is warm and dry. No rash noted. No erythema. No pallor.    ED Course  Procedures  Results for orders placed during the hospital encounter of 11/20/11  CBC      Component Value Range   WBC 4.9  4.0 - 10.5 (K/uL)   RBC 3.70 (*) 3.87 - 5.11 (MIL/uL)   Hemoglobin 11.2 (*) 12.0 - 15.0 (g/dL)   HCT 40.9 (*) 81.1 - 46.0 (%)   MCV 90.0  78.0 - 100.0 (fL)   MCH 30.3  26.0 - 34.0 (pg)   MCHC 33.6  30.0 - 36.0 (g/dL)   RDW 91.4  78.2 - 95.6 (%)   Platelets 203  150 - 400 (K/uL)  DIFFERENTIAL      Component Value Range   Neutrophils Relative 44  43 - 77 (%)   Neutro Abs 2.1  1.7 - 7.7 (K/uL)   Lymphocytes Relative 46  12 - 46 (%)   Lymphs Abs 2.3  0.7 - 4.0 (K/uL)   Monocytes Relative 7  3 - 12 (%)   Monocytes Absolute 0.3  0.1 - 1.0 (K/uL)   Eosinophils Relative 3  0 - 5 (%)   Eosinophils Absolute 0.2  0.0 - 0.7 (K/uL)   Basophils Relative 1  0 - 1 (%)   Basophils Absolute 0.0  0.0 - 0.1 (K/uL)  COMPREHENSIVE METABOLIC PANEL      Component Value Range   Sodium 137  135 - 145 (mEq/L)   Potassium 3.2 (*) 3.5 - 5.1 (mEq/L)   Chloride 99  96 - 112 (mEq/L)   CO2 30  19 - 32 (mEq/L)   Glucose, Bld 97  70 - 99 (mg/dL)   BUN 12  6 - 23 (mg/dL)   Creatinine, Ser 2.13  0.50 - 1.10 (mg/dL)   Calcium 9.5  8.4 - 08.6 (mg/dL)   Total Protein 7.4  6.0 - 8.3 (g/dL)   Albumin 3.5  3.5 - 5.2 (g/dL)    AST 19  0 - 37 (U/L)   ALT 25  0 - 35 (U/L)   Alkaline Phosphatase 47  39 - 117 (U/L)   Total Bilirubin 0.2 (*) 0.3 - 1.2 (mg/dL)   GFR calc non Af Amer >90  >90 (mL/min)   GFR calc Af Amer >90  >90 (mL/min)  CARDIAC PANEL(CRET KIN+CKTOT+MB+TROPI)      Component Value Range   Total CK 57  7 - 177 (U/L)   CK, MB 1.3  0.3 - 4.0 (ng/mL)   Troponin I <0.30  <0.30 (ng/mL)   Relative Index RELATIVE INDEX IS INVALID  0.0 - 2.5    Dg Chest 2 View  11/20/2011  *RADIOLOGY REPORT*  Clinical Data: Chest pain  CHEST - 2 VIEW  Comparison: Chest radiograph 12/13/2009  Findings: Normal mediastinum and heart silhouette.  Costophrenic angles are clear.  No effusion, infiltrate, or pneumothorax.  IMPRESSION: No acute cardiopulmonary process.  Original Report Authenticated By: Genevive Bi, M.D.      MDM   8:22 PM Patient does not want to stay for a  second set of markers. Patient has 3 risk factors for cardiac disease, hypertension, hyperlipidemia, early family history of heart disease. However patient's pain does not sound typical for acute coronary syndrome. Reports pain has been constant since last night, almost a full 24 hours. Pain is not exertional. However I did recommend to patient that she should followup with her primary care physician Dr. Benjie Karvonen tomorrow for further evaluation of chest pain. Patient is also perc negative.    Thomasene Lot, [place] 11/20/11 2026

## 2011-11-21 NOTE — ED Provider Notes (Signed)
Medical screening examination/treatment/procedure(s) were performed by non-physician practitioner and as supervising physician I was immediately available for consultation/collaboration.  Flint Melter, MD 11/21/11 [phone]

## 2011-11-22 NOTE — Telephone Encounter (Signed)
Left message for pt to call office back

## 2011-11-24 ENCOUNTER — Other Ambulatory Visit: Payer: Self-pay | Admitting: Medical

## 2011-11-24 ENCOUNTER — Encounter: Payer: Self-pay | Admitting: Medical

## 2011-11-24 ENCOUNTER — Ambulatory Visit (INDEPENDENT_AMBULATORY_CARE_PROVIDER_SITE_OTHER): Payer: BC Managed Care – PPO | Admitting: Medical

## 2011-11-24 VITALS — BP 118/80 | HR 72 | Temp 98.1°F | Wt 146.0 lb

## 2011-11-24 DIAGNOSIS — R519 Headache, unspecified: Secondary | ICD-10-CM | POA: Insufficient documentation

## 2011-11-24 DIAGNOSIS — M797 Fibromyalgia: Secondary | ICD-10-CM

## 2011-11-24 DIAGNOSIS — D649 Anemia, unspecified: Secondary | ICD-10-CM

## 2011-11-24 DIAGNOSIS — IMO0001 Reserved for inherently not codable concepts without codable children: Secondary | ICD-10-CM

## 2011-11-24 DIAGNOSIS — R51 Headache: Secondary | ICD-10-CM | POA: Insufficient documentation

## 2011-11-24 DIAGNOSIS — E876 Hypokalemia: Secondary | ICD-10-CM

## 2011-11-24 NOTE — Progress Notes (Addendum)
Subjective:   HPI  Sherry Daniels is a 51 y.o. female who presents for followup. At last visit she had anemia on labs. She is here for followup on this.  She has history of anemia over the last few years, has never been on therapy though. She has had a GI workup for bleeding without any serious bleeding issues.  She denies any current bleeding, has had a hysterectomy, and denies any bruising. She is due at this time for mammogram and Pap smear through her gynecologist.  She has started the nortriptyline after last visit, and says it is helping her headaches and fibromyalgia pain.  No other aggravating or relieving factors.  Of note, she was seen at the emergency department earlier this week for chest pains, but no cardiac origin found.  No other c/o.  The following portions of the patient's history were reviewed and updated as appropriate: allergies, current medications, past family history, past medical history, past social history, past surgical history and problem list.  Past Medical History  Diagnosis Date  . Allergy   . Hypertension   . Hyperlipidemia   . Asthma   . Migraine   . Nonalcoholic fatty liver disease   . GERD (gastroesophageal reflux disease)   . Fibromyalgia   . Chest pain 11/2009    overnight hospitalization   Review of Systems  Constitutional: -fever, -chills, +sweats, -unexpected -weight change,+fatigue  ENT: -runny nose, -ear pain, -sore throat  Cardiology: -chest pain, -palpitations, -edema  Respiratory: -cough, -shortness of breath, -wheezing  Gastroenterology: -abdominal pain, -nausea, -vomiting, -diarrhea, -constipation Hematology: -bleeding or bruising problems  Musculoskeletal: +arthralgias, +myalgias, -joint swelling, +back pain  Ophthalmology: +vision changes  Urology: -dysuria, -difficulty urinating, -hematuria, -urinary frequency, -urgency  Neurology: +headache, -weakness, -tingling, -numbness    Objective:   Physical Exam  Filed Vitals:   11/24/11  0832  BP: 118/80  Pulse: 72  Temp: 98.1 F (36.7 C)    General appearence: alert, no distress, WD/WN Oral cavity: MMM, no lesions Neck: supple, no lymphadenopathy, no thyromegaly, no masses Heart: RRR, normal S1, S2, no murmurs Lungs: CTA bilaterally, no wheezes, rhonchi, or rales Abdomen: +bs, soft, non tender, non distended, no masses, no hepatomegaly, no splenomegaly Pulses: 2+ symmetric, upper and lower extremities, normal cap refill   Assessment and Plan :    Encounter Diagnoses  Name Primary?  Marland Kitchen Anemia Yes  . Hypokalemia   . Fibromyalgia   . Headache    Anemia-review prior labs, prior GI records, an additional anemia workup today.  Given recent labs showing mild hypokalemia, she will start over-the-counter potassium supplement daily. Headaches and fibromyalgia pain somewhat improved on nortriptyline.  Follow-up pending labs.  She will followup with her gynecologist for mammogram Pap smear soon.

## 2011-11-25 LAB — BILIRUBIN, TOTAL: Total Bilirubin: 0.4 mg/dL (ref 0.3–1.2)

## 2011-11-25 LAB — BILIRUBIN,DIRECT & INDIRECT (FRACTIONATED)
Bilirubin, Direct: 0.1 mg/dL (ref 0.0–0.3)
Indirect Bilirubin: 0.3 mg/dL (ref 0.0–0.9)

## 2011-11-25 LAB — APTT: aPTT: 31 seconds (ref 24–37)

## 2011-11-25 LAB — PROTIME-INR: Prothrombin Time: 13.5 seconds (ref 11.6–15.2)

## 2011-11-27 LAB — FOLATE: Folate: >20 ng/mL

## 2011-11-27 LAB — IRON AND TIBC
%SAT: 29 % (ref 20–55)
Iron: 84 ug/dL (ref 42–145)

## 2011-11-27 LAB — VITAMIN B12: Vitamin B-12: 340 pg/mL (ref 211–911)

## 2011-11-27 LAB — RETICULOCYTES: ABS Retic: 22.9 10*3/uL (ref 19.0–186.0)

## 2011-11-27 LAB — PATHOLOGIST SMEAR REVIEW

## 2011-12-06 ENCOUNTER — Telehealth: Payer: Self-pay | Admitting: Family Medicine

## 2011-12-06 NOTE — Telephone Encounter (Signed)
Message copied by Janeice Robinson on Wed Dec 06, 2011 12:14 PM ------      Message from: Aleen Campi, DAVID S      Created: Wed Dec 06, 2011  5:37 AM       i got most of the labs, but not the bilirubin panel.  Ask Alvino Chapel about this.

## 2011-12-06 NOTE — Telephone Encounter (Signed)
Results are on your desk. CLS

## 2011-12-06 NOTE — Telephone Encounter (Signed)
See msg about CXR to correct this.  She does not need a chest xray. Did you call about the labs though?

## 2011-12-06 NOTE — Telephone Encounter (Signed)
PATIENT WAS NOTIFIED OF HER CXR REPORT AND TO GO [facility] TO HAVE A F/U XRAY IN 2 WEEKS. CLS

## 2011-12-06 NOTE — Telephone Encounter (Signed)
Message copied by Janeice Robinson on Wed Dec 06, 2011  4:39 PM ------      Message from: Aleen Campi, DAVID S      Created: Wed Dec 06, 2011  5:42 AM       Call about labs.  Does she have heavy periods?             Her recent labs show normal iron, B12, folate, she is negative for G6PD disease (rare cause of anemia).  The recent pathologist smear review suggestive an iron deficiency appearance of her blood although her iron was normal.              Ask her to take an OTC iron supplement such as once daily 325mg  iron OTC.  Take this with food or orange juice.            At this point ,have her continue the Nortriptyline for headaches and fibromyalgia.  Lets recheck in 2-3 mo to repeat CBC at that time and to recheck on her headaches, body aches and concerns.

## 2011-12-06 NOTE — Telephone Encounter (Signed)
PATIENT STATES THAT SHE DOES NOT HAVE A CYCLE. SHE HAD A HYSTERECTOMY 15 YRS AGO. CLS   PATIENT WAS NOTIFIED OF HER LAB RESULTS. CLS

## 2011-12-06 NOTE — Telephone Encounter (Signed)
PLEASE IGNORE THE CXR REPORT MESSAGE. IT WAS ENTERED AS A MISTAKE. PATIENT WAS NOTIFIED OF HER LAB RESULTS.CLS

## 2011-12-06 NOTE — Telephone Encounter (Signed)
Message copied by Janeice Robinson on Wed Dec 06, 2011  4:40 PM ------      Message from: Aleen Campi, DAVID S      Created: Wed Dec 06, 2011  5:42 AM       Call about labs.  Does she have heavy periods?             Her recent labs show normal iron, B12, folate, she is negative for G6PD disease (rare cause of anemia).  The recent pathologist smear review suggestive an iron deficiency appearance of her blood although her iron was normal.              Ask her to take an OTC iron supplement such as once daily 325mg  iron OTC.  Take this with food or orange juice.            At this point ,have her continue the Nortriptyline for headaches and fibromyalgia.  Lets recheck in 2-3 mo to repeat CBC at that time and to recheck on her headaches, body aches and concerns.

## 2012-02-09 ENCOUNTER — Telehealth (HOSPITAL_COMMUNITY): Payer: Self-pay | Admitting: *Deleted

## 2012-02-09 NOTE — ED Notes (Signed)
01/30/12  Pt. brought Short term disability papers to [facility] for visit on 05/08/2011. Form filled out by me. I had to wait for Dr. Lorenza Chick to come back. 02/09/12  Dr. Lorenza Chick reviewed the form and signed it.  He said he gave her a work note for 2 days. I told him I understood but this company requires this form even when out for 1-2 days. I called pt. and left a message telling her I had faxed the form  with her record to [phone] and received confirmation. I told her she can pick up the originals. They are in a brown envelope at the front desk. [name] 02/09/2012

## 2012-05-13 ENCOUNTER — Telehealth: Payer: Self-pay | Admitting: Medical

## 2012-05-13 MED ORDER — HYDROCHLOROTHIAZIDE 12.5 MG PO CAPS: 12.5000 mg | ORAL_CAPSULE | Freq: Every day | ORAL | Status: DC

## 2012-05-13 NOTE — Telephone Encounter (Signed)
RX refill was sent to the pharmacy but patient needs to schedule a office visit. CLS

## 2012-05-14 ENCOUNTER — Other Ambulatory Visit: Payer: Self-pay | Admitting: Family Medicine

## 2012-05-14 MED ORDER — HYDROCHLOROTHIAZIDE 12.5 MG PO CAPS: 12.5000 mg | ORAL_CAPSULE | Freq: Every day | ORAL | Status: DC

## 2012-07-03 ENCOUNTER — Encounter: Payer: BC Managed Care – PPO | Admitting: Medical

## 2012-07-05 ENCOUNTER — Ambulatory Visit (INDEPENDENT_AMBULATORY_CARE_PROVIDER_SITE_OTHER): Payer: BC Managed Care – PPO | Admitting: Medical

## 2012-07-05 ENCOUNTER — Encounter: Payer: Self-pay | Admitting: Medical

## 2012-07-05 VITALS — BP 132/70 | HR 60 | Temp 98.1°F | Resp 16 | Wt 152.0 lb

## 2012-07-05 DIAGNOSIS — I10 Essential (primary) hypertension: Secondary | ICD-10-CM

## 2012-07-05 DIAGNOSIS — D649 Anemia, unspecified: Secondary | ICD-10-CM

## 2012-07-05 DIAGNOSIS — G43909 Migraine, unspecified, not intractable, without status migrainosus: Secondary | ICD-10-CM

## 2012-07-05 DIAGNOSIS — E785 Hyperlipidemia, unspecified: Secondary | ICD-10-CM

## 2012-07-05 LAB — CBC WITH DIFFERENTIAL/PLATELET
Basophils Absolute: 0 10*3/uL (ref 0.0–0.1)
Basophils Relative: 1 % (ref 0–1)
Eosinophils Absolute: 0.2 10*3/uL (ref 0.0–0.7)
Eosinophils Relative: 7 % — ABNORMAL HIGH (ref 0–5)
HCT: 30.7 % — ABNORMAL LOW (ref 36.0–46.0)
MCH: 29.7 pg (ref 26.0–34.0)
MCHC: 34.2 g/dL (ref 30.0–36.0)
MCV: 86.7 fL (ref 78.0–100.0)
Monocytes Absolute: 0.2 10*3/uL (ref 0.1–1.0)
Platelets: 201 10*3/uL (ref 150–400)
RDW: 12.6 % (ref 11.5–15.5)

## 2012-07-05 LAB — LIPID PANEL
HDL: 63 mg/dL (ref >39)
LDL Cholesterol: 130 mg/dL — ABNORMAL HIGH (ref 0–99)
Triglycerides: 56 mg/dL (ref <150)
VLDL: 11 mg/dL (ref 0–40)

## 2012-07-05 LAB — COMPREHENSIVE METABOLIC PANEL
ALT: 16 U/L (ref 0–35)
AST: 15 U/L (ref 0–37)
Creat: 0.64 mg/dL (ref 0.50–1.10)
Sodium: 140 mEq/L (ref 135–145)
Total Bilirubin: 0.4 mg/dL (ref 0.3–1.2)
Total Protein: 6.3 g/dL (ref 6.0–8.3)

## 2012-07-05 MED ORDER — ASPIRIN 81 MG PO TBEC: DELAYED_RELEASE_TABLET | ORAL | Status: DC

## 2012-07-05 MED ORDER — CYCLOBENZAPRINE HCL 10 MG PO TABS: ORAL_TABLET | ORAL | Status: DC

## 2012-07-05 MED ORDER — PRAVASTATIN SODIUM 40 MG PO TABS: 40.0000 mg | ORAL_TABLET | Freq: Every day | ORAL | Status: DC

## 2012-07-05 MED ORDER — HYDROCHLOROTHIAZIDE 12.5 MG PO CAPS: 12.5000 mg | ORAL_CAPSULE | Freq: Every day | ORAL | Status: DC

## 2012-07-05 MED ORDER — ELETRIPTAN HYDROBROMIDE 40 MG PO TABS: 40.0000 mg | ORAL_TABLET | ORAL | Status: DC | PRN

## 2012-07-05 NOTE — Progress Notes (Signed)
  Subjective:   HPI  Sherry Daniels is a 51 y.o. female who presents for med check and routine f/u.   Been doing well in general.  She is a Merchandiser, retail for [employer], Engineer, maintenance (IT).    Hyperlipidemia - was started on this prior to coming to this practice due to high cholesterol.  She eats healthy, exercises regularly, uses pravastatin without c/o.   HTN - taking low dose HCTZ without c/o.  She was diagnosed a few years ago, most of first degree relatives have HTN.  She is a nonsmoker, eats healthy diet, exercises regularly.    Anemia - longstanding. Here for repeat labs.  No current bleeding or bruising.  Migrain - gets 2-3 per month.  She typically either takes nothing for acute migraine or goes to urgent care for a shot.   Hasn't used prescription abortive therapy.  Has been on pamelor but doesn't like to take this daily.  No other c/o.  The following portions of the patient's history were reviewed and updated as appropriate: allergies, current medications, past family history, past medical history, past social history, past surgical history and problem list.  Past Medical History  Diagnosis Date  . Allergy   . Hypertension   . Hyperlipidemia   . Asthma   . Migraine   . Nonalcoholic fatty liver disease   . GERD (gastroesophageal reflux disease)   . Fibromyalgia   . Chest pain 11/2009    overnight hospitalization    Allergies  Allergen Reactions  . Aspirin Nausea Only  . Simvastatin Other (See Comments)    dizziness     Review of Systems ROS reviewed and was negative other than noted in HPI or above.    Objective:   Physical Exam  General appearance: alert, no distress, WD/WN Oral cavity: MMM, no lesions Neck: supple, no lymphadenopathy, no thyromegaly, no masses, no bruits Heart: RRR, normal S1, S2, no murmurs Lungs: CTA bilaterally, no wheezes, rhonchi, or rales Abdomen: +bs, soft, non tender, non distended, no masses, no hepatomegaly, no  splenomegaly Pulses: 2+ symmetric, upper and lower extremities, normal cap refill   Assessment and Plan :      Encounter Diagnoses  Name Primary?  . Hyperlipidemia Yes  . Essential hypertension, benign   . Anemia   . Migraine    Labs today, c/t same medications, c/t exercise and healthy diet, advised that she will likely be on lipid and BP medication for life, largely due to genetic component of her lipid and hypertensive disease although controlled.  Can try Aspirin 81mg  3 times per week since daily doesn't work for her.   Gave samples of Relpax as a trial for migraine abortive therapy.  discussed proper use of medication, risks/benefits.   We will call with lab results.

## 2012-07-16 ENCOUNTER — Encounter: Payer: Self-pay | Admitting: Medical

## 2012-07-24 ENCOUNTER — Encounter: Payer: Self-pay | Admitting: Family Medicine

## 2012-07-31 ENCOUNTER — Telehealth: Payer: Self-pay

## 2012-07-31 NOTE — Telephone Encounter (Signed)
Pt called today and said she has not been to hematology and she doesn't take a iron sup. So she asked to be referred and will start taking otc iron sup.

## 2012-08-05 NOTE — Telephone Encounter (Signed)
I FAX OVER ALL INFORMATION TO HEMATOLOGY FOR THE PATIENTS REFERRAL. CLS

## 2012-08-16 ENCOUNTER — Telehealth: Payer: Self-pay | Admitting: Medical

## 2012-08-16 NOTE — Telephone Encounter (Signed)
They potentially could get scabies.  Clean sheets, vacuum, avoid sleeping in same bed as the son.   If they start to get similar bumps, can use OTC Permethrin cream in the same way the son was advised to use it.  There is a prescription strength version though.  So if she gets similar rash not going away with OTC Permethrin, then let me know.

## 2012-08-16 NOTE — Telephone Encounter (Signed)
CALLED PT. ADVISED OF SHANE'S INSTRUCTIONS

## 2012-08-16 NOTE — Telephone Encounter (Signed)
PT'S SON HAD APPT TO SEE LALONDE TODAY BUT WENT TO DERMATOLOGY THIS MORNING AS EMERGENCY AND CANCELED HERE. HE WAS DIAGNOSED WITH SCABIES. Sherry Daniels IS CONCERNED ABOUT HERSELF AND OTHER FAMILY MEMBERS THAT ARE ALSO YOUR PATIENT'S. ARE THEY ARE RISK FOR GETTING SCABIES WITH SON IN THE HOME WITH THEM SINCE HE IS HOME FROM [place]? IF SO, CAN THEY ALL GET MED AS A PREVENTATIVE MEASURE TO KEEP THEM FROM GETTING IT?      [email or web address]  OTHER FAMILY MEMBERS:  [name]  [id number] [name]   [phone]

## 2012-09-04 ENCOUNTER — Other Ambulatory Visit: Payer: Self-pay | Admitting: Medical

## 2012-09-04 ENCOUNTER — Telehealth: Payer: Self-pay | Admitting: Family Medicine

## 2012-09-04 MED ORDER — ELETRIPTAN HYDROBROMIDE 40 MG PO TABS: 40.0000 mg | ORAL_TABLET | ORAL | Status: DC | PRN

## 2012-09-04 NOTE — Telephone Encounter (Signed)
relpax sent, but check for samples too.   If we have some, then she can have some samples (4-6 sample tablets).

## 2012-09-04 NOTE — Telephone Encounter (Signed)
Patient is aware that the medication was sent to the pharmacy. CLS 

## 2012-11-12 ENCOUNTER — Other Ambulatory Visit: Payer: Self-pay | Admitting: Internal Medicine

## 2012-11-12 MED ORDER — ESOMEPRAZOLE MAGNESIUM 40 MG PO CPDR: DELAYED_RELEASE_CAPSULE | ORAL | Status: DC

## 2012-11-12 NOTE — Telephone Encounter (Signed)
Rx for Nexium was sent to the pharmacy. CLS

## 2012-12-13 ENCOUNTER — Ambulatory Visit: Payer: BC Managed Care – PPO

## 2013-02-10 ENCOUNTER — Ambulatory Visit: Payer: Self-pay | Admitting: Medical

## 2013-02-11 ENCOUNTER — Other Ambulatory Visit: Payer: Self-pay | Admitting: Medical

## 2013-02-27 ENCOUNTER — Telehealth: Payer: Self-pay | Admitting: Internal Medicine

## 2013-02-27 NOTE — Telephone Encounter (Signed)
Faxed medical records to [facility] @ [phone]

## 2013-02-28 ENCOUNTER — Other Ambulatory Visit: Payer: Self-pay | Admitting: Medical

## 2013-03-18 ENCOUNTER — Telehealth: Payer: Self-pay | Admitting: Medical

## 2013-03-18 NOTE — Telephone Encounter (Signed)
THIS LETTER & EKG WAS IN YOUR FOLDER YESTERDAY

## 2013-03-19 NOTE — Telephone Encounter (Signed)
pls pull this again.  i had placed on cart to scan

## 2013-03-20 ENCOUNTER — Encounter: Payer: Self-pay | Admitting: Medical

## 2013-04-06 ENCOUNTER — Other Ambulatory Visit: Payer: Self-pay | Admitting: Medical

## 2013-04-12 ENCOUNTER — Telehealth: Payer: Self-pay | Admitting: Medical

## 2013-04-15 NOTE — Telephone Encounter (Signed)
LM

## 2013-05-09 ENCOUNTER — Institutional Professional Consult (permissible substitution): Payer: Self-pay | Admitting: Medical

## 2013-05-23 ENCOUNTER — Encounter: Payer: Self-pay | Admitting: Medical

## 2013-05-23 ENCOUNTER — Ambulatory Visit (INDEPENDENT_AMBULATORY_CARE_PROVIDER_SITE_OTHER): Payer: BC Managed Care – PPO | Admitting: Medical

## 2013-05-23 VITALS — BP 110/80 | HR 62 | Temp 97.8°F | Resp 16 | Wt 149.0 lb

## 2013-05-23 DIAGNOSIS — D649 Anemia, unspecified: Secondary | ICD-10-CM

## 2013-05-23 DIAGNOSIS — E785 Hyperlipidemia, unspecified: Secondary | ICD-10-CM

## 2013-05-23 DIAGNOSIS — I10 Essential (primary) hypertension: Secondary | ICD-10-CM

## 2013-05-23 LAB — CBC WITH DIFFERENTIAL/PLATELET
Eosinophils Absolute: 0.2 10*3/uL (ref 0.0–0.7)
HCT: 37.1 % (ref 36.0–46.0)
Hemoglobin: 12.6 g/dL (ref 12.0–15.0)
Lymphs Abs: 1.7 10*3/uL (ref 0.7–4.0)
MCH: 29.2 pg (ref 26.0–34.0)
Monocytes Absolute: 0.2 10*3/uL (ref 0.1–1.0)
Monocytes Relative: 6 % (ref 3–12)
Neutrophils Relative %: 45 % (ref 43–77)
RBC: 4.31 MIL/uL (ref 3.87–5.11)

## 2013-05-23 LAB — COMPREHENSIVE METABOLIC PANEL
Albumin: 4.2 g/dL (ref 3.5–5.2)
CO2: 28 mEq/L (ref 19–32)
Glucose, Bld: 95 mg/dL (ref 70–99)
Potassium: 3.8 mEq/L (ref 3.5–5.3)
Sodium: 136 mEq/L (ref 135–145)
Total Bilirubin: 0.5 mg/dL (ref 0.3–1.2)
Total Protein: 7.1 g/dL (ref 6.0–8.3)

## 2013-05-23 LAB — LIPID PANEL
Cholesterol: 245 mg/dL — ABNORMAL HIGH (ref 0–200)
VLDL: 18 mg/dL (ref 0–40)

## 2013-05-23 NOTE — Progress Notes (Signed)
Subjective:   HPI  Sherry Daniels is a 52 y.o. female who presents for concerns.   Been doing well in general.  She is a Merchandiser, retail for [employer], Engineer, maintenance (IT).    Here today because back in May went to purchase a new life insurance policy and was denied due to an abnormal EKG.  The tech doing the EKG sent it to [place] for review.  She is here to discuss.  She has no current chest pain, palpitations, DOE, edema.  Hyperlipidemia - was started on Pravastatin prior to coming to this practice due to high cholesterol.  She feels like she doesn't need to be on this and stopped it on her own 2 months ago.   Makes her ache and doesn't like the say she feels on it.  She eats healthy, exercises regularly.   HTN - taking low dose HCTZ without c/o.  She was diagnosed a few years ago, most of first degree relatives have HTN.  She is a nonsmoker, eats healthy diet, exercises regularly.    Anemia - longstanding. Here for repeat labs.  No current bleeding or bruising.  No other c/o.  The following portions of the patient's history were reviewed and updated as appropriate: allergies, current medications, past family history, past medical history, past social history, past surgical history and problem list.  Past Medical History  Diagnosis Date  . Allergy   . Hypertension   . Hyperlipidemia   . Asthma   . Migraine   . Nonalcoholic fatty liver disease   . GERD (gastroesophageal reflux disease)   . Fibromyalgia   . Chest pain 11/2009    overnight hospitalization  . Constipation     Dr. Loreta Ave    Allergies  Allergen Reactions  . Aspirin Nausea Only  . Simvastatin Other (See Comments)    dizziness    Review of Systems ROS reviewed and was negative other than noted in HPI or above.    Objective:   Physical Exam  General appearance: alert, no distress, WD/WN Oral cavity: MMM, no lesions Neck: supple, no lymphadenopathy, no thyromegaly, no masses, no bruits Heart: RRR, normal  S1, S2, no murmurs Lungs: CTA bilaterally, no wheezes, rhonchi, or rales Abdomen: +bs, soft, non tender, non distended, no masses, no hepatomegaly, no splenomegaly Pulses: 2+ symmetric, upper and lower extremities, normal cap refill Ext: no edema   Adult ECG Report  Indication: hypertension, reportedly abnormal insurance physical  Rate: 66 bpm  Rhythm: normal sinus rhythm  QRS Axis: 36 degrees  PR Interval:  QRS Duration: 76ms  QTc:  Conduction Disturbances: none  Other Abnormalities: none  Patient's cardiac risk factors are: dyslipidemia, family history of premature cardiovascular disease and hypertension.  EKG comparison: 11/20/11 unchanged  Narrative Interpretation: normal EKG, no worrisome findings    Assessment and Plan :      Encounter Diagnoses  Name Primary?  . Essential hypertension, benign Yes  . Hyperlipidemia   . Anemia    Advised that our EKG, both today and prior are unchanged and don't show worrisome findings.   I suspect the 03/2013 EKG that I reviewed may have had lead placement issue, but appears different than our EKG.  I see no worrisome changes on her EKG, she is compliant with her BP medication, and she has no worrisome symptoms.  Will write letter on her behalf.   Routine labs today.  Advised healthy diet, routine exercise, and given prior NMR lipo profile, do advise we  consider alternate medication for cholesterol lowering.

## 2013-05-24 ENCOUNTER — Other Ambulatory Visit: Payer: Self-pay | Admitting: Medical

## 2013-05-24 MED ORDER — ATORVASTATIN CALCIUM 20 MG PO TABS: 20.0000 mg | ORAL_TABLET | Freq: Every day | ORAL | Status: DC

## 2013-06-03 ENCOUNTER — Telehealth: Payer: Self-pay | Admitting: Internal Medicine

## 2013-06-03 NOTE — Telephone Encounter (Signed)
Faxed medical records [facility] @ [phone]

## 2013-06-13 ENCOUNTER — Telehealth: Payer: Self-pay | Admitting: Internal Medicine

## 2013-06-13 NOTE — Telephone Encounter (Signed)
Faxed over medical records to [facility] @ [phone] and [facility] [phone].

## 2013-06-19 DIAGNOSIS — Z0289 Encounter for other administrative examinations: Secondary | ICD-10-CM

## 2013-06-30 ENCOUNTER — Telehealth: Payer: Self-pay | Admitting: Internal Medicine

## 2013-06-30 NOTE — Telephone Encounter (Signed)
Faxed over medical records to [employer] @ [phone]

## 2013-07-04 ENCOUNTER — Other Ambulatory Visit: Payer: Self-pay

## 2013-07-04 MED ORDER — ELETRIPTAN HYDROBROMIDE 40 MG PO TABS: ORAL_TABLET | ORAL | Status: DC

## 2013-07-04 NOTE — Telephone Encounter (Signed)
PT CAME IN TODAY WITH MIGRAINE DR.LALONDE SAID IT WAS OK TO GIVE PT 2 SAMPLES OF RELPAX

## 2013-07-07 ENCOUNTER — Telehealth: Payer: Self-pay | Admitting: Medical

## 2013-07-07 ENCOUNTER — Other Ambulatory Visit: Payer: Self-pay | Admitting: Medical

## 2013-07-07 MED ORDER — ELETRIPTAN HYDROBROMIDE 40 MG PO TABS: ORAL_TABLET | ORAL | Status: DC

## 2013-07-07 NOTE — Telephone Encounter (Signed)
rx sent

## 2013-07-07 NOTE — Telephone Encounter (Signed)
Pt came in having a onset of migraine. She stated she was in area. She was requesting a sample of migraine medication. A sample was provided and approved by jcl. Pt needs refill so she will have some if needed. Please refill migraine meds.

## 2013-07-08 NOTE — Telephone Encounter (Signed)
Lmom for the patient. CLS

## 2013-07-27 ENCOUNTER — Other Ambulatory Visit: Payer: Self-pay | Admitting: Medical

## 2013-09-13 ENCOUNTER — Other Ambulatory Visit: Payer: Self-pay | Admitting: Medical

## 2013-11-12 ENCOUNTER — Other Ambulatory Visit: Payer: Self-pay | Admitting: Medical

## 2014-01-03 ENCOUNTER — Other Ambulatory Visit: Payer: Self-pay | Admitting: Medical

## 2014-01-05 NOTE — Telephone Encounter (Signed)
IS THIS OKAY TO REFILL 

## 2014-04-11 ENCOUNTER — Other Ambulatory Visit: Payer: Self-pay | Admitting: Medical

## 2014-06-04 ENCOUNTER — Other Ambulatory Visit: Payer: Self-pay | Admitting: Medical

## 2014-06-05 ENCOUNTER — Ambulatory Visit: Payer: BC Managed Care – PPO | Admitting: Medical

## 2014-09-07 ENCOUNTER — Other Ambulatory Visit: Payer: Self-pay | Admitting: Obstetrics & Gynecology

## 2014-09-07 DIAGNOSIS — R928 Other abnormal and inconclusive findings on diagnostic imaging of breast: Secondary | ICD-10-CM

## 2014-09-21 ENCOUNTER — Ambulatory Visit
Admission: RE | Admit: 2014-09-21 | Discharge: 2014-09-21 | Disposition: A | Discharge status: Home / Self Care | Payer: BC Managed Care – PPO | Source: Ambulatory Visit | Attending: Obstetrics & Gynecology | Admitting: Obstetrics & Gynecology

## 2014-09-21 ENCOUNTER — Other Ambulatory Visit: Payer: Self-pay | Admitting: Medical

## 2014-09-21 DIAGNOSIS — R928 Other abnormal and inconclusive findings on diagnostic imaging of breast: Secondary | ICD-10-CM

## 2014-09-27 ENCOUNTER — Telehealth: Payer: Self-pay | Admitting: Medical

## 2014-09-29 NOTE — Telephone Encounter (Signed)
P.A. Esomeprazole approved til 09/27/15, faxed pharmacy, left message for pt

## 2014-10-17 ENCOUNTER — Other Ambulatory Visit: Payer: Self-pay | Admitting: Medical

## 2014-10-22 ENCOUNTER — Ambulatory Visit (INDEPENDENT_AMBULATORY_CARE_PROVIDER_SITE_OTHER): Payer: BC Managed Care – PPO | Admitting: Family Medicine

## 2014-10-22 ENCOUNTER — Encounter: Payer: Self-pay | Admitting: Family Medicine

## 2014-10-22 VITALS — BP 120/84 | HR 60 | Temp 98.7°F | Ht 66.0 in | Wt 158.0 lb

## 2014-10-22 DIAGNOSIS — I10 Essential (primary) hypertension: Secondary | ICD-10-CM

## 2014-10-22 DIAGNOSIS — M545 Low back pain: Secondary | ICD-10-CM

## 2014-10-22 DIAGNOSIS — M62830 Muscle spasm of back: Secondary | ICD-10-CM

## 2014-10-22 LAB — POCT URINALYSIS DIPSTICK
BILIRUBIN UA: NEGATIVE
Blood, UA: NEGATIVE
GLUCOSE UA: NEGATIVE
Leukocytes, UA: NEGATIVE
Nitrite, UA: NEGATIVE
Protein, UA: NEGATIVE
Spec Grav, UA: >=1.03
Urobilinogen, UA: 0.2
pH, UA: 6

## 2014-10-22 MED ORDER — METHOCARBAMOL 500 MG PO TABS: 500.0000 mg | ORAL_TABLET | Freq: Four times a day (QID) | ORAL | Status: DC | PRN

## 2014-10-22 NOTE — Patient Instructions (Addendum)
Continue taking aleve up to 2 tablets twice daily with food. Continue heat (try thermacare patches during the day while out and about). Do the stretches as shown (for the deep buttock muscles, as well as hamstring stretches) at least twice daily. It is recommended to strengthen core and back muscles (ie yoga, pilates). Consider seeing chiropractor if the pain persists.  Use the robaxin during the day--this should not be as sedating as the flexeril.  You may continue to use the flexeril at night--consider taking it earlier or just 1/2 tablet so that you aren't as tired in the morning.  [facility], and on your back pain if not improving.  Back Pain, Adult Low back pain is very common. About 1 in 5 people have back pain.The cause of low back pain is rarely dangerous. The pain often gets better over time.About half of people with a sudden onset of back pain feel better in just 2 weeks. About 8 in 10 people feel better by 6 weeks.  CAUSES Some common causes of back pain include:  Strain of the muscles or ligaments supporting the spine.  Wear and tear (degeneration) of the spinal discs.  Arthritis.  Direct injury to the back. DIAGNOSIS Most of the time, the direct cause of low back pain is not known.However, back pain can be treated effectively even when the exact cause of the pain is unknown.Answering your caregiver's questions about your overall health and symptoms is one of the most accurate ways to make sure the cause of your pain is not dangerous. If your caregiver needs more information, he or she may order lab work or imaging tests (X-rays or MRIs).However, even if imaging tests show changes in your back, this usually does not require surgery. HOME CARE INSTRUCTIONS For many people, back pain returns.Since low back pain is rarely dangerous, it is often a condition that people can learn to [facility] their own.   Remain active. It is stressful on the  back to sit or stand in one place. Do not sit, drive, or stand in one place for more than 30 minutes at a time. Take short walks on level surfaces as soon as pain allows.Try to increase the length of time you walk each day.  Do not stay in bed.Resting more than 1 or 2 days can delay your recovery.  Do not avoid exercise or work.Your body is made to move.It is not dangerous to be active, even though your back may hurt.Your back will likely heal faster if you return to being active before your pain is gone.  Pay attention to your body when you bend and lift. Many people have less discomfortwhen lifting if they bend their knees, keep the load close to their bodies,and avoid twisting. Often, the most comfortable positions are those that put less stress on your recovering back.  Find a comfortable position to sleep. Use a firm mattress and lie on your side with your knees slightly bent. If you lie on your back, put a pillow under your knees.  Only take over-the-counter or prescription medicines as directed by your caregiver. Over-the-counter medicines to reduce pain and inflammation are often the most helpful.Your caregiver may prescribe muscle relaxant drugs.These medicines help dull your pain so you can more quickly return to your normal activities and healthy exercise.  Put ice on the injured area.  Put ice in a plastic bag.  Place a towel between your skin and the bag.  Leave the  ice on for 15-20 minutes, 03-04 times a day for the first 2 to 3 days. After that, ice and heat may be alternated to reduce pain and spasms.  Ask your caregiver about trying back exercises and gentle massage. This may be of some benefit.  Avoid feeling anxious or stressed.Stress increases muscle tension and can worsen back pain.It is important to recognize when you are anxious or stressed and learn ways to manage it.Exercise is a great option. SEEK MEDICAL CARE IF:  You have pain that is not relieved  with rest or medicine.  You have pain that does not improve in 1 week.  You have new symptoms.  You are generally not feeling well. SEEK IMMEDIATE MEDICAL CARE IF:   You have pain that radiates from your back into your legs.  You develop new bowel or bladder control problems.  You have unusual weakness or numbness in your arms or legs.  You develop nausea or vomiting.  You develop abdominal pain.  You feel faint. Document Released: 10/30/2005 Document Revised: 04/30/2012 Document Reviewed: 03/03/2014 [facility] Patient Information 2015 [place]. This information is not intended to replace advice given to you by your health care provider. Make sure you discuss any questions you have with your health care provider.

## 2014-10-22 NOTE — Progress Notes (Signed)
Chief Complaint  Patient presents with  . Back Pain    having lbp that radiates down the back of her legs. Hips B/L sore and her buttocks hurt to the touch. Patient has fibromylagia. She is requesting a cortisone shot today. UC has done this for her in the past and she has felt much better.    She has fibromyalgia.  She has been under a lot of stress. She is thinking she is having a bad flare up.  She is having flare of low back pain for the last 4-5 days.  She has been taking flexeril at bedtime--it does help, but can't take during the day.  She felt much better this morning, but worse since she has been up.  She feels tired in the morning from the flexeril. She has also taken 2-4 tablets per day of Aleve.  She took two this morning.  She hasn't noticed much benefit.  Heating pad seems to help, only using at night.  She used alternating heat and ice over the weekend, which was helpful.  She is requesting cortisone shot, as this has been helpful when given in the past from urgent care.  She has pain with sitting, and at night.  Pain is across the low back, into the muscles of her buttocks. It is sore to the touch.  When she touches the low back/buttock, with pressure applied (such as lying down) it causes numbness and tingling into both legs. She thinks the right is more than the left.  Denies any weakness.  Denies bowel/bladder problems. Denies any urinary complaints.  She hasn't been seen in this office in over a year. She ran out of her blood pressure medication 2 weeks ago. She hasn't had headaches, chest pain, or edema.  PMH, PSH, SH reviewed/updated.  Outpatient Encounter Prescriptions as of 10/22/2014  Medication Sig Note  . atorvastatin (LIPITOR) 20 MG tablet Take 1 tablet (20 mg total) by mouth daily.   . cyclobenzaprine (FLEXERIL) 10 MG tablet TAKE 1 TABLET AT BEDTIME AS NEEDED 10/22/2014: Uses prn, using it this week for back pain  . estradiol (ESTRACE) 0.1 MG/GM vaginal cream Place 1  Applicatorful vaginally 2 (two) times a week.   . estradiol (VIVELLE-DOT) 0.05 MG/24HR patch Place 1 patch onto the skin 2 (two) times a week.  10/22/2014: Received from: External Pharmacy  . Multiple Vitamins-Minerals (MULTIVITAMIN WITH MINERALS) tablet Take 1 tablet by mouth daily.   Marland Kitchen. NEXIUM 40 MG capsule 1 TABLET 45 MIN BEFORE BREAKFAST DAILY   . aspirin 81 MG EC tablet 1 tablet po qOD (Patient not taking: Reported on 10/22/2014)   . eletriptan (RELPAX) 40 MG tablet TAKE 1 TAB AT ONSET OF HEADACHE.MAY REPEAT IN 2HRS IF HEADACHE PERSISTS (Patient not taking: Reported on 10/22/2014)   . hydrochlorothiazide (MICROZIDE) 12.5 MG capsule TAKE ONE CAPSULE BY MOUTH DAILY (Patient not taking: Reported on 10/22/2014) 10/22/2014: Ran out 2 weeks ago  . [DISCONTINUED] RELPAX 40 MG tablet TAKE 1 TABLET BY MOUTH AT ONSET OF HEADACHE.MAY REPEAT IN 2HOURS IF HEADACHE PERSISTS (Patient not taking: Reported on 10/22/2014)    Allergies  Allergen Reactions  . Aspirin Nausea Only  . Simvastatin Other (See Comments)    dizziness   ROS:  Denies fevers, chills, URI symptoms, chest pain, headaches, shortness of breath, cough, GI complaints.  Denies weakness, falls.  +low back pain and tingling as per HPI. No rashes, bleeding or other concerns.  PHYSICAL EXAM: BP 120/84 mmHg  Pulse 60  Temp(Src)  98.7 F (37.1 C) (Tympanic)  Ht 5\' 6"  (1.676 m)  Wt 158 lb (71.668 kg)  BMI 25.51 kg/m2 Well developed, pleasant female, in no acute distress Back: Spine nontender. No CVA tenderness Tender at bilateral SI joints and sciatic notch, and diffusely into buttock muscles.  Negative straight leg raise.  Strength is 5/5, DTR's 2+ and symmetric. Normal sensation to light touch.  She has pain with pyriformis stretch, but good range of motion.  Urine dip--notable only for ketones and concentrated urine.  ASSESSMENT/PLAN:  Low back pain, unspecified back pain laterality, with sciatica presence unspecified - suspect some  component of fibromyalgia, some SI joint dysfunction with pyriformis spasm - Plan: POCT Urinalysis Dipstick  Muscle spasm of back - robaxin during day, flexeril if needed at night.  risks/side effects reviewed.  continue heat, stretches, NSAIDs - Plan: methocarbamol (ROBAXIN) 500 MG tablet  Essential hypertension - past due for f/u.  BP okay today despite being i pain and out of BP meds.  f/u with [facility] for med check  Reviewed risks/side effects and indications for steroids.  I do not feel that they are indicated at this time.  F/u with [facility] if not improving.   Continue taking aleve up to 2 tablets twice daily with food. Continue heat (try thermacare patches during the day while out and about). Do the stretches as shown (for the deep buttock muscles, as well as hamstring stretches) at least twice daily. It is recommended to strengthen core and back muscles (ie yoga, pilates). Consider seeing chiropractor if the pain persists.  Use the robaxin during the day--this should not be as sedating as the flexeril.  You may continue to use the flexeril at night--consider taking it earlier or just 1/2 tablet so that you aren't as tired in the morning.  [facility], and on your back pain if not improving.

## 2014-10-26 ENCOUNTER — Ambulatory Visit: Payer: BC Managed Care – PPO | Admitting: Medical

## 2014-12-01 ENCOUNTER — Ambulatory Visit (INDEPENDENT_AMBULATORY_CARE_PROVIDER_SITE_OTHER): Payer: BC Managed Care – PPO | Admitting: Medical

## 2014-12-01 ENCOUNTER — Telehealth: Payer: Self-pay | Admitting: Medical

## 2014-12-01 ENCOUNTER — Encounter: Payer: Self-pay | Admitting: Medical

## 2014-12-01 VITALS — BP 132/90 | HR 61 | Temp 97.9°F | Resp 16 | Wt 161.0 lb

## 2014-12-01 DIAGNOSIS — K219 Gastro-esophageal reflux disease without esophagitis: Secondary | ICD-10-CM

## 2014-12-01 DIAGNOSIS — M2550 Pain in unspecified joint: Secondary | ICD-10-CM

## 2014-12-01 DIAGNOSIS — D538 Other specified nutritional anemias: Secondary | ICD-10-CM

## 2014-12-01 DIAGNOSIS — E785 Hyperlipidemia, unspecified: Secondary | ICD-10-CM

## 2014-12-01 DIAGNOSIS — I10 Essential (primary) hypertension: Secondary | ICD-10-CM

## 2014-12-01 DIAGNOSIS — M797 Fibromyalgia: Secondary | ICD-10-CM

## 2014-12-01 LAB — COMPREHENSIVE METABOLIC PANEL
ALT: 16 U/L (ref 0–35)
AST: 16 U/L (ref 0–37)
Albumin: 3.7 g/dL (ref 3.5–5.2)
Alkaline Phosphatase: 45 U/L (ref 39–117)
BUN: 12 mg/dL (ref 6–23)
CALCIUM: 9 mg/dL (ref 8.4–10.5)
CO2: 25 mEq/L (ref 19–32)
CREATININE: 0.72 mg/dL (ref 0.50–1.10)
Chloride: 106 mEq/L (ref 96–112)
Glucose, Bld: 81 mg/dL (ref 70–99)
Potassium: 3.7 mEq/L (ref 3.5–5.3)
SODIUM: 139 meq/L (ref 135–145)
Total Bilirubin: 0.3 mg/dL (ref 0.2–1.2)
Total Protein: 6.9 g/dL (ref 6.0–8.3)

## 2014-12-01 LAB — CBC WITH DIFFERENTIAL/PLATELET
BASOS PCT: 1 % (ref 0–1)
Basophils Absolute: 0 10*3/uL (ref 0.0–0.1)
EOS PCT: 3 % (ref 0–5)
Eosinophils Absolute: 0.1 10*3/uL (ref 0.0–0.7)
HCT: 34.7 % — ABNORMAL LOW (ref 36.0–46.0)
Hemoglobin: 11.5 g/dL — ABNORMAL LOW (ref 12.0–15.0)
LYMPHS PCT: 53 % — AB (ref 12–46)
Lymphs Abs: 1.9 10*3/uL (ref 0.7–4.0)
MCH: 29.3 pg (ref 26.0–34.0)
MCHC: 33.1 g/dL (ref 30.0–36.0)
MCV: 88.3 fL (ref 78.0–100.0)
MPV: 10 fL (ref 8.6–12.4)
Monocytes Absolute: 0.2 10*3/uL (ref 0.1–1.0)
Monocytes Relative: 6 % (ref 3–12)
NEUTROS PCT: 37 % — AB (ref 43–77)
Neutro Abs: 1.3 10*3/uL — ABNORMAL LOW (ref 1.7–7.7)
Platelets: 248 10*3/uL (ref 150–400)
RBC: 3.93 MIL/uL (ref 3.87–5.11)
RDW: 13.9 % (ref 11.5–15.5)
WBC: 3.5 10*3/uL — ABNORMAL LOW (ref 4.0–10.5)

## 2014-12-01 MED ORDER — HYDROCHLOROTHIAZIDE 12.5 MG PO CAPS: 12.5000 mg | ORAL_CAPSULE | Freq: Every day | ORAL | Status: DC

## 2014-12-01 NOTE — Progress Notes (Signed)
Subjective: Here for routine follow-up on high blood pressure medication.  Last visit for this was July 2014 with me.  She states that she just needs her blood pressure medicine refilled.  In general blood pressures run fine, however she has been off the medication since she ran out.   She initially seems to be pushed for time, says all she wants is her medication refill.   Hyperlipidemia-based on last labs in phone message regarding results she decided not to resume the medication. At this point, doesn't want to take a statin.  She has had elevated LDL particular and prior NMR lipoprofile, HTN hx/o and hx/o premature cardiac disease in father.   Acid reflux, abdominal pain - sees Dr. Loreta AveMann, will be getting updated endoscopy soon.  In general she wants referral to rheumatology.  Was diagnosed years ago by headache specialist with fibromyalgia. She wonders about lupus or rheumatoid arthritis. In general has muscle aches all over regularly, but also has joint aches throughout. She has pains in both knees, both ankles, both shoulders, fatigue all the time, sometimes swelling in the knees.  Has morning stiffness regularly.  Lately been getting some rash that she thinks maybe lupus related  Review of systems as in subjective  Past Medical History  Diagnosis Date  . Allergy   . Hypertension   . Hyperlipidemia   . Asthma   . Migraine   . Nonalcoholic fatty liver disease   . GERD (gastroesophageal reflux disease)   . Fibromyalgia   . Chest pain 11/2009    overnight hospitalization  . Constipation     Dr. Loreta AveMann     Objective: BP 132/90 mmHg  Pulse 61  Temp(Src) 97.9 F (36.6 C) (Oral)  Resp 16  Wt 161 lb (73.029 kg)  General appearance: alert, no distress, WD/WN Oral cavity: MMM, no lesions Neck: supple, no lymphadenopathy, no thyromegaly, no masses Heart: RRR, normal S1, S2, no murmurs Lungs: CTA bilaterally, no wheezes, rhonchi, or rales Back: non tender Musculoskeletal: tender in  numerous fibromyalgia points throughout, no obvious joint swelling, no specific major bony findings, no obvious deformity Extremities: no edema, no cyanosis, no clubbing Pulses: 2+ symmetric, upper and lower extremities, normal cap refill   Assessment: Encounter Diagnoses  Name Primary?  . Essential hypertension Yes  . Other specified nutritional anemias   . Polyarthralgia   . Fibromyalgia   . Hyperlipidemia   . Gastroesophageal reflux disease without esophagitis    Plan: Hypertension-c/t same medication, controlled, advised need for at minimal yearly f/u.   Labs today Anemia - labs today for surveillance, but not discussed today Polyarthralgia, fibromyalgia - referral to rheumatology Hyperlipidemia - based on new guidelines, not in the statin benefit category GERD - sees Dr. Loreta AveMann.

## 2014-12-01 NOTE — Telephone Encounter (Signed)
Refer to Dr. Kellie Simmeringruslow for consult on joint pains, fibromyalgia, wants to be tested for Lupus

## 2014-12-02 NOTE — Telephone Encounter (Signed)
I fax over the patient's paper work to Dr. Ines Bloomerruslow's office Fax Number [phone] and they will contact the patient for her appointment

## 2014-12-03 NOTE — Progress Notes (Signed)
LM to CB

## 2014-12-16 ENCOUNTER — Telehealth: Payer: Self-pay | Admitting: Family Medicine

## 2014-12-16 NOTE — Telephone Encounter (Signed)
I didn't necessarily expect rheumatology to reply back as such so if desired have her return for rheumatoid lab panel screening panel as a next step.  Let me know if she wants to do this.

## 2014-12-16 NOTE — Telephone Encounter (Signed)
I left a detailed message on the patient's voicemail to call us to set up an appointment for lab work

## 2014-12-16 NOTE — Telephone Encounter (Signed)
I called over to Dr. Ines Bloomerruslow's office about the referral on this patient. The referral coordinator said that Dr. Kellie Simmeringruslow look over her record and said that he didn't see any reason through her records for her to be seen. Dr. Kellie Simmeringruslow feels's like it is more related to Fibromyalgia and he doesn't treat that.

## 2014-12-29 ENCOUNTER — Telehealth: Payer: Self-pay | Admitting: Internal Medicine

## 2014-12-29 ENCOUNTER — Other Ambulatory Visit: Payer: Self-pay | Admitting: Medical

## 2014-12-29 MED ORDER — ELETRIPTAN HYDROBROMIDE 40 MG PO TABS: ORAL_TABLET | ORAL | Status: DC

## 2014-12-29 NOTE — Telephone Encounter (Signed)
Pt needs a refill on relpax 40mg  for migraine she has had for 3 days. [facility]

## 2015-03-09 ENCOUNTER — Encounter: Payer: Self-pay | Admitting: Family Medicine

## 2015-03-09 ENCOUNTER — Ambulatory Visit (INDEPENDENT_AMBULATORY_CARE_PROVIDER_SITE_OTHER): Payer: BC Managed Care – PPO | Admitting: Family Medicine

## 2015-03-09 VITALS — BP 118/68 | HR 68 | Wt 157.4 lb

## 2015-03-09 DIAGNOSIS — L02511 Cutaneous abscess of right hand: Secondary | ICD-10-CM

## 2015-03-09 MED ORDER — DOXYCYCLINE HYCLATE 100 MG PO TABS: 100.0000 mg | ORAL_TABLET | Freq: Two times a day (BID) | ORAL | Status: DC

## 2015-03-09 NOTE — Progress Notes (Signed)
   Subjective:    Patient ID: Sherry HampshireValerie R Callies, female    DOB: December 17, 1960, 54 y.o.   MRN: [id number]  HPI She damaged her nailbed several weeks ago when she was getting her nails done. Now she is having some throbbing and pain in the distal right third fingertip.   Review of Systems     Objective:   Physical Exam Swelling and discomfort on palpation noted to the fingertip of the third finger on the right. No VQ is noted.      Assessment & Plan:  Felon, right - Plan: doxycycline (VIBRA-TABS) 100 MG tablet She will call if she notes any color change at the corner of the nail bed and return here for possible I+D. Also discussed spread of pain proximal on her finger and to call us if that occurs.

## 2015-03-11 ENCOUNTER — Encounter: Payer: Self-pay | Admitting: Family Medicine

## 2015-03-11 ENCOUNTER — Ambulatory Visit (INDEPENDENT_AMBULATORY_CARE_PROVIDER_SITE_OTHER): Payer: BC Managed Care – PPO | Admitting: Family Medicine

## 2015-03-11 VITALS — BP 118/66 | HR 68 | Temp 97.9°F | Ht 66.0 in | Wt 156.6 lb

## 2015-03-11 DIAGNOSIS — L03011 Cellulitis of right finger: Secondary | ICD-10-CM

## 2015-03-11 MED ORDER — CEPHALEXIN 500 MG PO CAPS: 500.0000 mg | ORAL_CAPSULE | Freq: Three times a day (TID) | ORAL | Status: DC

## 2015-03-11 MED ORDER — CEFTRIAXONE SODIUM 1 G IJ SOLR
1.0000 g | Freq: Once | INTRAMUSCULAR | Status: AC
  Administered 2015-03-11: 1 g via INTRAMUSCULAR

## 2015-03-11 NOTE — Patient Instructions (Addendum)
Stop the doxycycline. Start taking Keflex three times daily (can start this evening, or in the morning--the injection given today should cover until then).  Continue to soak the finger at least 3-4 times daily. Return Monday for recheck--go to urgent care over the weekend if increasing swelling, worsening pain, fevers, or other changes.  You don't need to come Monday if you are significantly better. We will have the culture results by then and contact you if the antbiotic needs to be changed.

## 2015-03-11 NOTE — Progress Notes (Signed)
Chief Complaint  Patient presents with  . Infection    right middle finger infection, saw Dr.Lalonde this past Tuesday and is taking abx-infection has worsened and is extremely painful.    Patient presents to recheck right middle finger.  She was started on doxycycline 2 days ago for infection, but pain and swelling have gotten worse. She has been soaking the finger, but it has not drained. No fevers or chills. Mild nausea, possibly related to tylenol/aleve or from the pain, or from the antibiotic.  PMH, PSH, and SH reviewed.  Outpatient Encounter Prescriptions as of 03/11/2015  Medication Sig Note  . acetaminophen (TYLENOL) 500 MG tablet Take 1,500 mg by mouth every 6 (six) hours as needed.   Marland Kitchen aspirin 81 MG EC tablet 1 tablet po qOD   . doxycycline (VIBRA-TABS) 100 MG tablet Take 1 tablet (100 mg total) by mouth 2 (two) times daily.   Marland Kitchen estradiol (VIVELLE-DOT) 0.05 MG/24HR patch Place 1 patch onto the skin 2 (two) times a week.  10/22/2014: Received from: External Pharmacy  . hydrochlorothiazide (MICROZIDE) 12.5 MG capsule Take 1 capsule (12.5 mg total) by mouth daily.   Marland Kitchen ibuprofen (ADVIL,MOTRIN) 200 MG tablet Take 400 mg by mouth every 6 (six) hours as needed.   . Multiple Vitamins-Minerals (MULTIVITAMIN WITH MINERALS) tablet Take 1 tablet by mouth daily.   Marland Kitchen NEXIUM 40 MG capsule 1 TABLET 45 MIN BEFORE BREAKFAST DAILY   . cyclobenzaprine (FLEXERIL) 10 MG tablet TAKE 1 TABLET AT BEDTIME AS NEEDED (Patient not taking: Reported on 03/11/2015) 10/22/2014: Uses prn, using it this week for back pain  . eletriptan (RELPAX) 40 MG tablet TAKE 1 TAB AT ONSET OF HEADACHE.MAY REPEAT IN 2HRS IF HEADACHE PERSISTS (Patient not taking: Reported on 03/11/2015)    Allergies  Allergen Reactions  . Aspirin Nausea Only  . Simvastatin Other (See Comments)    dizziness   ROS: no fevers, chills, URI symptoms, vomiting, diarrhea, abdominal pain, rash, numbness tingling, or other complaints, except as noted in  HPI.  PHYSICAL EXAM: BP 118/66 mmHg  Pulse 68  Temp(Src) 97.9 F (36.6 C) (Tympanic)  Ht  (1.676 m)  Wt 156 lb 9.6 oz (71.033 kg)  BMI 25.29 kg/m2  Well developed, pleasant female, in moderately discomfort with palpation of her right 3rd finger.  Right third finger--there is swelling and erythema of the distal phalanx.  The erythema is on the dorsum, not the palmar surface, but the palmar surface is swollen and very tender, firm.  On the radial side of the nail there is focal swelling and appears to have white below the surface of the skin, fluctuant. There is brisk capillary refill in the fingertip (unable to assess below the nail--wearing blue nail polish). There is no significant proximal swelling in the digit, or streaking.  ASSESSMENT/PLAN:  Paronychia, right - significant abscess, extending to pulp of fingertip. improved after I&D - Plan: cephALEXin (KEFLEX) 500 MG capsule, cefTRIAXone (ROCEPHIN) injection 1 g, Wound culture, PR DRAIN SKIN ABSCESS SIMPLE, CANCELED: Wound culture  Given rapidity of worsening infection and abscess, antibiotics were changed to Keflex, and given IM Rocephin.  Doubtful that it is MRSA--will change ABX based on culture if needed. Return for recheck on Monday if not significantly improved, sooner prn recurrent swelling/pain.  Procedure note: Ethyl chloride spray was used, then the superficial area was anesthetized with 2% lidocaine without epinephrine.  Moderate amount of yellow pus drained upon incision with 11 blade. No discomfort with procedure. Then the finger  tip was pressed--and the area was milked to further drain purulent drainage, as tolerated by patient. Finger was then soaked in warm water for 15 minutes.  No further purulent drainage could be expelled, and fingertip was much softer.  She reported significant improvement.  Pt tolerated the procedure, without complications. Wound culture sent.

## 2015-03-14 LAB — WOUND CULTURE: GRAM STAIN: NONE SEEN

## 2015-03-15 ENCOUNTER — Ambulatory Visit (INDEPENDENT_AMBULATORY_CARE_PROVIDER_SITE_OTHER): Payer: BC Managed Care – PPO | Admitting: Family Medicine

## 2015-03-15 ENCOUNTER — Encounter: Payer: Self-pay | Admitting: Family Medicine

## 2015-03-15 VITALS — BP 120/72 | HR 76 | Temp 98.6°F | Ht 66.0 in | Wt 157.6 lb

## 2015-03-15 DIAGNOSIS — L03011 Cellulitis of right finger: Secondary | ICD-10-CM

## 2015-03-15 NOTE — Progress Notes (Signed)
Chief Complaint  Patient presents with  . Follow-up    on finger. Much better-had some tingling last night and some throbbing.    Patient presents for follow up on finger abscess.  Patient hasn't had any further drainage, believes that the incision has closed up.  She states it feels significantly better--no recurrence of swelling, no ongoing drainage.  Pain is gone.  Denies side effects to the Keflex  Current Outpatient Prescriptions on File Prior to Visit  Medication Sig Dispense Refill  . acetaminophen (TYLENOL) 500 MG tablet Take 1,500 mg by mouth every 6 (six) hours as needed.    Marland Kitchen. aspirin 81 MG EC tablet 1 tablet po qOD 30 tablet 12  . cephALEXin (KEFLEX) 500 MG capsule Take 1 capsule (500 mg total) by mouth 3 (three) times daily. 30 capsule 0  . estradiol (VIVELLE-DOT) 0.05 MG/24HR patch Place 1 patch onto the skin 2 (two) times a week.   9  . hydrochlorothiazide (MICROZIDE) 12.5 MG capsule Take 1 capsule (12.5 mg total) by mouth daily. 90 capsule 1  . Multiple Vitamins-Minerals (MULTIVITAMIN WITH MINERALS) tablet Take 1 tablet by mouth daily.    Marland Kitchen. NEXIUM 40 MG capsule 1 TABLET 45 MIN BEFORE BREAKFAST DAILY 30 capsule 5  . cyclobenzaprine (FLEXERIL) 10 MG tablet TAKE 1 TABLET AT BEDTIME AS NEEDED (Patient not taking: Reported on 03/15/2015) 30 tablet 0  . eletriptan (RELPAX) 40 MG tablet TAKE 1 TAB AT ONSET OF HEADACHE.MAY REPEAT IN 2HRS IF HEADACHE PERSISTS (Patient not taking: Reported on 03/11/2015) 10 tablet 1  . ibuprofen (ADVIL,MOTRIN) 200 MG tablet Take 400 mg by mouth every 6 (six) hours as needed.     No current facility-administered medications on file prior to visit.   Allergies  Allergen Reactions  . Aspirin Nausea Only  . Simvastatin Other (See Comments)    dizziness   ROS:  No nausea, vomiting, diarrhea, rash, fever, chills or other concerns.  PHYSICAL EXAM: BP 120/72 mmHg  Pulse 76  Temp(Src) 98.6 F (37 C) (Tympanic)  Ht 5\' 6"  (1.676 m)  Wt 157 lb 9.6 oz  (71.487 kg)  BMI 25.45 kg/m2   Well developed, pleasant female in no distress Examination of right 3rd finger--there is very mild persistent swelling of the distal phalynx compared to other fingers.  This is now soft, nontender, and without erythema.  No drainage.  Incision has healed  ASSESSMENT/PLAN:  Paronychia, right - healing, s/p I&D and antibiotics.  Doing well.  complete course of keflex.

## 2015-07-02 ENCOUNTER — Encounter: Payer: Self-pay | Admitting: Medical

## 2015-07-02 LAB — HM COLONOSCOPY

## 2015-07-13 ENCOUNTER — Ambulatory Visit (INDEPENDENT_AMBULATORY_CARE_PROVIDER_SITE_OTHER): Payer: BC Managed Care – PPO | Admitting: Family Medicine

## 2015-07-13 ENCOUNTER — Encounter: Payer: Self-pay | Admitting: Family Medicine

## 2015-07-13 VITALS — BP 124/70 | HR 60 | Wt 158.6 lb

## 2015-07-13 DIAGNOSIS — S4991XA Unspecified injury of right shoulder and upper arm, initial encounter: Secondary | ICD-10-CM

## 2015-07-13 DIAGNOSIS — S59901A Unspecified injury of right elbow, initial encounter: Secondary | ICD-10-CM

## 2015-07-13 NOTE — Patient Instructions (Signed)
  Go to [facility] for your shoulder X ray when you leave. We will let you know your results as soon as we get them. Try taking 2 Aleve twice daily for the pain. If you continue taking Advil instead of Aleve, take 800 mg 3 times daily for next 3-4 days and see if this helps.  You may also use heat 20 minutes 2-3 times daily.

## 2015-07-13 NOTE — Progress Notes (Signed)
   Subjective:    Patient ID: Sherry Daniels, female    DOB: [birth year], 54 y.o.   MRN: [id number]  HPI She is here for right shoulder and right elbow pain. She states she tripped and fell down 4 steps at her home on August 7, approximately 3 weeks ago landing on her right shoulder and elbow.  Denies loss of consciousness, neck or back pain.She reports a dull ache to her right shoulder that worsens with movement and is also painful at night. Reports a limited range of motion due to pain. She has been taking 1-2 Advil 2-3 times per day for pain and also using heat. She states right elbow pain is not as bad but occasionally has pain when placing her elbow on a table or chair. She denies numbness or tingling.  Denies fever, chills.  She states she has a history of fibromyalgia and has been using Flexeril without relief also.  Reviewed allergies, medical history, medications and surgical history. Review of Systems Pertinent positives and negatives in the history of present illness.     Objective:   Physical Exam  Alert and in no distress. No asymmetry to shoulders, no bruising, redness or swelling to R shoulder. No tenderness to right clavicle or scapula. Sensation and pulses to RUE normal. Tenderness to right acromioclavicular joint, predominantly anterior. Limited ROM due to pain. No laxity.  Positive Neers and Hawkins, positive empty can test. Right elbow without erythema, bruising, nontender, full ROM.       Assessment & Plan:  Shoulder injury, right, initial encounter - Plan: DG Shoulder Right  Elbow injury, right, initial encounter  We will get an x-ray of her right shoulder. Since she has full range of motion to her elbow and I was not able to reproduce the pain, no imaging indicated for this. Discussed that she should take Aleve 2 pills, 2 times per day or if she decides to take Advil that she should take 800 mg 3 times per day. She can continue using heat to her shoulder 20 minutes at a  time 2-3 times per day. Once we get the x-ray result, Dr. Susann Givens states he would consider examining her and possibly giving her an injection to her shoulder.  She will follow-up pending x-ray results.

## 2015-07-14 ENCOUNTER — Ambulatory Visit
Admission: RE | Admit: 2015-07-14 | Discharge: 2015-07-14 | Disposition: A | Discharge status: Home / Self Care | Payer: BC Managed Care – PPO | Source: Ambulatory Visit | Attending: Family Medicine | Admitting: Family Medicine

## 2015-07-14 DIAGNOSIS — S4991XA Unspecified injury of right shoulder and upper arm, initial encounter: Secondary | ICD-10-CM

## 2015-07-15 ENCOUNTER — Other Ambulatory Visit: Payer: Self-pay | Admitting: Family Medicine

## 2015-07-15 MED ORDER — HYDROCODONE-ACETAMINOPHEN 5-325 MG PO TABS: 1.0000 | ORAL_TABLET | Freq: Four times a day (QID) | ORAL | Status: DC | PRN

## 2015-08-11 ENCOUNTER — Encounter: Payer: Self-pay | Admitting: Medical

## 2015-09-08 ENCOUNTER — Ambulatory Visit (INDEPENDENT_AMBULATORY_CARE_PROVIDER_SITE_OTHER): Payer: BC Managed Care – PPO | Admitting: Medical

## 2015-09-08 ENCOUNTER — Encounter: Payer: Self-pay | Admitting: Medical

## 2015-09-08 VITALS — BP 136/100 | HR 76 | Wt 157.0 lb

## 2015-09-08 DIAGNOSIS — F430 Acute stress reaction: Secondary | ICD-10-CM | POA: Diagnosis not present

## 2015-09-08 DIAGNOSIS — R0789 Other chest pain: Secondary | ICD-10-CM | POA: Diagnosis not present

## 2015-09-08 MED ORDER — ALPRAZOLAM 0.5 MG PO TABS: 0.5000 mg | ORAL_TABLET | Freq: Every evening | ORAL | Status: DC | PRN

## 2015-09-08 NOTE — Progress Notes (Signed)
Subjective: Chief Complaint  Patient presents with  . tramatic personal issues    feels like she may need a medicaition. says she is having a sever flare up of her fibromyalgia.depression screening done.   Here today for personal issues .  She doesn't want to go into great detail but had a traumatic event happen this weekend.  It was a personal family issue, but she doesn't give details.  She does note for months is helping siblings take care of her parents, and there have been struggles with this dealing with parents and with communication and responsibilities with siblings.   She has a high stress job in general.   She notes that she is gong through a difficult time, particularly in light of the new triggering event that occurred this weekend.    She doesn't feel depressed, just is having panic episodes, lots of anxiety since this weekend.  Would like a medication to help calm her nerves from time to time.   Doesn't want to have to take something daily.   She has been on Cymbalta prior but it made her fibromyalgia worse, and she feels like the emotional issues currently are making the fibromyalgia worse.   She denies self harm, and denies having current safety concerns.  She has thought about seeing a counseling.   Over the past few days has felt some chest tightness with the anxiety episodes.   Doesn't check BP regularly but is compliant with HCTZ.  Her brother is her pastor, and although he has prayed over her, she doesn't necessarily feel that she can tell him her concerns.   No other aggravating or relieving factors. No other complaint.  Past Medical History  Diagnosis Date  . Allergy   . Hypertension   . Hyperlipidemia   . Asthma   . Migraine   . Nonalcoholic fatty liver disease   . GERD (gastroesophageal reflux disease)   . Fibromyalgia   . Chest pain 11/2009    overnight hospitalization  . Constipation     Dr. Loreta AveMann   ROS as in subjective   Objective: BP 136/100 mmHg  Pulse 76  Wt  157 lb (71.215 kg)  Gen: wd, wn, nad Heart: RRR, normal s1, s2, no murmurs Lungs clear Ext: no edema Pulses norma Neuro: nonfocal Psych: poor eye contact, teary eyed at times, seems anxious   Adult ECG Report  Indication: chest tightness, anxiety  Rate: 70 bpm  Rhythm: normal sinus rhythm  QRS Axis: 12 degrees  PR Interval: 176ms  QRS Duration: 82ms  QTc: 449ms  Conduction Disturbances: none  Other Abnormalities: none  Patient's cardiac risk factors are: hypertension.  EKG comparison: none  Narrative Interpretation: normal EKG     Assessment: Encounter Diagnoses  Name Primary?  . Acute stress reaction Yes  . Chest tightness     Plan: discussed her concerns . Gave list of counselors and strongly encouraged her to seek counseling today.  Discussed ways to cope, gave xanax for prn use, discussed risks/benefits of medication.   discussed calling or returning right away for any reason.   Advised she check BP a few times per week, and if staying high like today's readings, we can add beta blocker to help with anxiety and BP.  Recheck in 1-2 wk, sooner prn.

## 2015-09-08 NOTE — Patient Instructions (Signed)
Counseling services   [facility] [phone] office [email or web address] [address] [place], [place] [address]  Gale JourneyLaura Atkinson, therapist  Franchot ErichsenErik Nelson, MA, clinical psychologist  Cognitive-Behavior Therapy; Mood Disorders; Anxiety Disorders; adult and child ADHD; Family Therapy; Stress Management; personal growth, and Marital Therapy.    Carlus Pavlovennis McKnight Ph.D., clinical psychologist Cognitive-Behavior Therapy; Mood Disorders; Anxiety Disorders; Stress     Management   [facility] [address] [place], [place] [address] [phone]   [facility] [address], [place], [place] [address] [phone]   [facility] Address: [address], [place], [place] [address] Phone: [phone]

## 2015-09-24 ENCOUNTER — Other Ambulatory Visit: Payer: Self-pay | Admitting: Medical

## 2015-09-24 NOTE — Telephone Encounter (Signed)
Is this ok to refill?  

## 2015-09-27 ENCOUNTER — Telehealth: Payer: Self-pay | Admitting: Medical

## 2015-09-27 NOTE — Telephone Encounter (Signed)
Pt states she has been doubling up on prescription because the severity of what she is going through. Said she will call and schedule the appt for 2 weeks and will discuss this with you and about upping her dosage.

## 2015-09-27 NOTE — Telephone Encounter (Signed)
Phoned in.

## 2015-09-27 NOTE — Telephone Encounter (Signed)
Call out xanax but lets make f/u in a week or 2 on anxiety.

## 2015-09-28 ENCOUNTER — Telehealth: Payer: Self-pay | Admitting: Medical

## 2015-09-28 NOTE — Telephone Encounter (Signed)
She used double dose which is not what I advised so she has run out or is running out too quickly.    Has she started counseling with anyone to help with coping skills?  If not,needs to do this right away.  We can refer if she has no one in mind.  Regarding medication, if having such hard time coping, can come back in to discuss further and other medication options.

## 2015-09-28 NOTE — Telephone Encounter (Signed)
Is seeing a minister for counseling with her family. She states the pharmacist told her it was ok to double her dose. She is scheduling a follow up appt said she will call back today for that to discuss next steps.

## 2015-09-28 NOTE — Telephone Encounter (Signed)
Pt states that pharmacy will not allow her to pick up Xanax refill at the current dosing that was sent in until the 11/26 and pt said she need this med now to help her. This is because she doubled the dosing or use 1 1/2 of the dose on the last script toward the end of the meds in order for it to help her cope with her issues. She realizes now that she should have called Vincenza HewsShane about this. Will Vincenza HewsShane consider changing the instructions and or dosing on the Xanax this time so that she can pick up the med now? Pt is aware that she need to make a follow up appt and will do so.

## 2015-09-29 ENCOUNTER — Encounter: Payer: Self-pay | Admitting: Medical

## 2015-09-29 ENCOUNTER — Ambulatory Visit (INDEPENDENT_AMBULATORY_CARE_PROVIDER_SITE_OTHER): Payer: BC Managed Care – PPO | Admitting: Medical

## 2015-09-29 VITALS — BP 118/70 | HR 69 | Wt 149.0 lb

## 2015-09-29 DIAGNOSIS — Z658 Other specified problems related to psychosocial circumstances: Secondary | ICD-10-CM

## 2015-09-29 DIAGNOSIS — G4700 Insomnia, unspecified: Secondary | ICD-10-CM | POA: Diagnosis not present

## 2015-09-29 DIAGNOSIS — F410 Panic disorder [episodic paroxysmal anxiety] without agoraphobia: Secondary | ICD-10-CM | POA: Diagnosis not present

## 2015-09-29 MED ORDER — CITALOPRAM HYDROBROMIDE 10 MG PO TABS: 10.0000 mg | ORAL_TABLET | Freq: Every day | ORAL | Status: DC

## 2015-09-29 MED ORDER — SUVOREXANT 10 MG PO TABS: 1.0000 | ORAL_TABLET | Freq: Every day | ORAL | Status: DC

## 2015-09-29 NOTE — Progress Notes (Signed)
Subjective: Chief Complaint  Patient presents with  . medications    can get a refill on the 26th with xanax. wanted something to lsat her till then but i informed her that maybe you could discuss other options.   Here today for f/u from recent visit where we discussed stressed, spiritual warfare she is struggling with, insomnia, and difficulty handling things of late.  She doesn't want to go into great detail but had a traumatic event happen recently.  It was a personal family issue, but she doesn't give details.  She does note for months is helping siblings take care of her parents, and there have been struggles with this dealing with parents and with communication and responsibilities with siblings.   She has a high stress job in general.   She notes that she is gong through a difficult time, particularly in light of the new triggering event that occurred prior to last visit.  She doesn't feel depressed, just is having panic episodes, lots of anxiety, having trouble sleeping.  She has been on Cymbalta prior but it made her fibromyalgia worse, and she feels like the emotional issues currently are making the fibromyalgia worse.   She denies self harm, and denies having current safety concerns.  Her brother is her pastor, and although he has prayed over her, she doesn't necessarily feel that she can tell him her concerns.  Since last visit she has been seeing a Optician, dispensingminister for counseling.   The xanax prescribed last visit helps with sleep, takes it at bedtime.  She feels that taking this at bedtime helps her get rest to get up and deal with spiritual warfare the next day.  She has used 2 at at time on some days to help with sleep.  No other aggravating or relieving factors. No other complaint.  Past Medical History  Diagnosis Date  . Allergy   . Hypertension   . Hyperlipidemia   . Asthma   . Migraine   . Nonalcoholic fatty liver disease   . GERD (gastroesophageal reflux disease)   . Fibromyalgia   .  Chest pain 11/2009    overnight hospitalization  . Constipation     Dr. Loreta AveMann   ROS as in subjective   Objective: BP 118/70 mmHg  Pulse 69  Wt 149 lb (67.586 kg)  SpO2 98%  Gen: wd, wn, nad Psych: good eye contact, teary eyed at times, seems anxious    Assessment: Encounter Diagnoses  Name Primary?  . Panic attack Yes  . Spiritual distress   . Insomnia     Plan: discussed her concerns .  Discussed ways to cope, gave xanax for prn use, discussed risks/benefits of medication.   Advised of my concern for daily use of Xanax and addiction potential.  We will use a trial of Belsomra for sleep aid, gave samples of 10mg  and 15mg  doses for the next week.   Recommended she begin citalopram QHS.   discussed using Xanax as a last resort for panic episode.   discussed medications, risks/ benefits and gave instructions on medications as we move forward.   C/t counseling, glad to see she has spiritual support, and advised she call me within 2 weeks to update me on sleep, medication tolerance, and overall how she is doing.  discussed calling or returning right away for any reason otherwise

## 2015-09-29 NOTE — Patient Instructions (Signed)
Recommendations:  Begin samples of Belsomra sleep aid  nightly the next 3 days.   Once you run out of the  Belsomra, see if the  works better or the same  Let me know how well the Charyl Bigger is working  Over the weekend if you are not having problems with Belsomra, then you can begin Citalopram.  Begin Citalopram , 1/2 tablet daily at bedtime for 1 week, then 1 tablet at  Bedtime daily.  citalopram is a medication to help with feelings of panic attack and anxiety, can be taken daily for an extended period of time, and is not addictive  You can get the Xanax filled, but use this as a last resort for panic attack.  This is addictive and should be used sparingly   Panic Attacks Panic attacks are sudden, short-livedsurges of severe anxiety, fear, or discomfort. They may occur for no reason when you are relaxed, when you are anxious, or when you are sleeping. Panic attacks may occur for a number of reasons:   Healthy people occasionally have panic attacks in extreme, life-threatening situations, such as war or natural disasters. Normal anxiety is a protective mechanism of the body that helps [place] react to danger (fight or flight response).  Panic attacks are often seen with anxiety disorders, such as panic disorder, social anxiety disorder, generalized anxiety disorder, and phobias. Anxiety disorders cause excessive or uncontrollable anxiety. They may interfere with your relationships or other life activities.  Panic attacks are sometimes seen with other mental illnesses, such as depression and posttraumatic stress disorder.  Certain medical conditions, prescription medicines, and drugs of abuse can cause panic attacks. SYMPTOMS  Panic attacks start suddenly, peak within 20 minutes, and are accompanied by four or more of the following symptoms:  Pounding heart or fast heart rate (palpitations).  Sweating.  Trembling or shaking.  Shortness of breath or feeling  smothered.  Feeling choked.  Chest pain or discomfort.  Nausea or strange feeling in your stomach.  Dizziness, light-headedness, or feeling like you will faint.  Chills or hot flushes.  Numbness or tingling in your lips or hands and feet.  Feeling that things are not real or feeling that you are not yourself.  Fear of losing control or going crazy.  Fear of dying. Some of these symptoms can mimic serious medical conditions. For example, you may think you are having a heart attack. Although panic attacks can be very scary, they are not life threatening. DIAGNOSIS  Panic attacks are diagnosed through an assessment by your health care provider. Your health care provider will ask questions about your symptoms, such as where and when they occurred. Your health care provider will also ask about your medical history and use of alcohol and drugs, including prescription medicines. Your health care provider may order blood tests or other studies to rule out a serious medical condition. Your health care provider may refer you to a mental health professional for further evaluation. TREATMENT   Most healthy people who have one or two panic attacks in an extreme, life-threatening situation will not require treatment.  The treatment for panic attacks associated with anxiety disorders or other mental illness typically involves counseling with a mental health professional, medicine, or a combination of both. Your health care provider will help determine what treatment is best for you.  Panic attacks due to physical illness usually go away with treatment of the illness. If prescription medicine is causing panic attacks, talk with your health care provider about  stopping the medicine, decreasing the dose, or substituting another medicine.  Panic attacks due to alcohol or drug abuse go away with abstinence. Some adults need professional help in order to stop drinking or using drugs. HOME CARE INSTRUCTIONS    Take all medicines as directed by your health care provider.   Schedule and attend follow-up visits as directed by your health care provider. It is important to keep all your appointments. SEEK MEDICAL CARE IF:  You are not able to take your medicines as prescribed.  Your symptoms do not improve or get worse. SEEK IMMEDIATE MEDICAL CARE IF:   You experience panic attack symptoms that are different than your usual symptoms.  You have serious thoughts about hurting yourself or others.  You are taking medicine for panic attacks and have a serious side effect. MAKE SURE YOU:  Understand these instructions.  Will watch your condition.  Will get help right away if you are not doing well or get worse.   This information is not intended to replace advice given to you by your health care provider. Make sure you discuss any questions you have with your health care provider.   Document Released: 10/30/2005 Document Revised: 11/04/2013 Document Reviewed: 06/13/2013 Elsevier Interactive Patient Education [employer]2016 Elsevier Inc.

## 2015-10-16 ENCOUNTER — Emergency Department (HOSPITAL_COMMUNITY)
Admission: EM | Admit: 2015-10-16 | Discharge: 2015-10-16 | Disposition: A | Discharge status: Home / Self Care | Payer: BC Managed Care – PPO | Attending: Emergency Medicine | Admitting: Emergency Medicine

## 2015-10-16 ENCOUNTER — Encounter (HOSPITAL_COMMUNITY): Payer: Self-pay | Admitting: Emergency Medicine

## 2015-10-16 ENCOUNTER — Emergency Department (HOSPITAL_COMMUNITY): Payer: BC Managed Care – PPO

## 2015-10-16 DIAGNOSIS — Z8739 Personal history of other diseases of the musculoskeletal system and connective tissue: Secondary | ICD-10-CM | POA: Diagnosis not present

## 2015-10-16 DIAGNOSIS — S79911A Unspecified injury of right hip, initial encounter: Secondary | ICD-10-CM | POA: Insufficient documentation

## 2015-10-16 DIAGNOSIS — Y9389 Activity, other specified: Secondary | ICD-10-CM | POA: Diagnosis not present

## 2015-10-16 DIAGNOSIS — S199XXA Unspecified injury of neck, initial encounter: Secondary | ICD-10-CM | POA: Insufficient documentation

## 2015-10-16 DIAGNOSIS — Y92410 Unspecified street and highway as the place of occurrence of the external cause: Secondary | ICD-10-CM | POA: Diagnosis not present

## 2015-10-16 DIAGNOSIS — S3992XA Unspecified injury of lower back, initial encounter: Secondary | ICD-10-CM | POA: Insufficient documentation

## 2015-10-16 DIAGNOSIS — I10 Essential (primary) hypertension: Secondary | ICD-10-CM | POA: Diagnosis not present

## 2015-10-16 DIAGNOSIS — J45909 Unspecified asthma, uncomplicated: Secondary | ICD-10-CM | POA: Insufficient documentation

## 2015-10-16 DIAGNOSIS — G43909 Migraine, unspecified, not intractable, without status migrainosus: Secondary | ICD-10-CM | POA: Diagnosis not present

## 2015-10-16 DIAGNOSIS — S0993XA Unspecified injury of face, initial encounter: Secondary | ICD-10-CM | POA: Diagnosis not present

## 2015-10-16 DIAGNOSIS — Z8639 Personal history of other endocrine, nutritional and metabolic disease: Secondary | ICD-10-CM | POA: Diagnosis not present

## 2015-10-16 DIAGNOSIS — Z7982 Long term (current) use of aspirin: Secondary | ICD-10-CM | POA: Insufficient documentation

## 2015-10-16 DIAGNOSIS — R52 Pain, unspecified: Secondary | ICD-10-CM

## 2015-10-16 DIAGNOSIS — Z8719 Personal history of other diseases of the digestive system: Secondary | ICD-10-CM | POA: Diagnosis not present

## 2015-10-16 DIAGNOSIS — Y998 Other external cause status: Secondary | ICD-10-CM | POA: Diagnosis not present

## 2015-10-16 DIAGNOSIS — Z79899 Other long term (current) drug therapy: Secondary | ICD-10-CM | POA: Insufficient documentation

## 2015-10-16 DIAGNOSIS — S0990XA Unspecified injury of head, initial encounter: Secondary | ICD-10-CM | POA: Insufficient documentation

## 2015-10-16 MED ORDER — IBUPROFEN 600 MG PO TABS: 600.0000 mg | ORAL_TABLET | Freq: Four times a day (QID) | ORAL | Status: DC | PRN

## 2015-10-16 MED ORDER — IBUPROFEN 800 MG PO TABS
800.0000 mg | ORAL_TABLET | Freq: Once | ORAL | Status: AC
  Administered 2015-10-16: 800 mg via ORAL
  Filled 2015-10-16: qty 1

## 2015-10-16 MED ORDER — METHOCARBAMOL 500 MG PO TABS
500.0000 mg | ORAL_TABLET | Freq: Once | ORAL | Status: AC
  Administered 2015-10-16: 500 mg via ORAL
  Filled 2015-10-16: qty 1

## 2015-10-16 MED ORDER — METHOCARBAMOL 500 MG PO TABS: 500.0000 mg | ORAL_TABLET | Freq: Two times a day (BID) | ORAL | Status: DC
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
## 2015-10-18 ENCOUNTER — Encounter: Payer: Self-pay | Admitting: Medical

## 2015-10-18 ENCOUNTER — Ambulatory Visit (INDEPENDENT_AMBULATORY_CARE_PROVIDER_SITE_OTHER): Payer: BC Managed Care – PPO | Admitting: Medical

## 2015-10-18 VITALS — BP 140/100 | HR 63 | Wt 157.0 lb

## 2015-10-18 DIAGNOSIS — M542 Cervicalgia: Secondary | ICD-10-CM

## 2015-10-18 DIAGNOSIS — R519 Headache, unspecified: Secondary | ICD-10-CM

## 2015-10-18 DIAGNOSIS — R51 Headache: Secondary | ICD-10-CM

## 2015-10-18 DIAGNOSIS — M545 Low back pain, unspecified: Secondary | ICD-10-CM

## 2015-10-18 MED ORDER — HYDROCODONE-ACETAMINOPHEN 5-325 MG PO TABS: 1.0000 | ORAL_TABLET | Freq: Four times a day (QID) | ORAL | Status: DC | PRN
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
## 2015-11-16 ENCOUNTER — Other Ambulatory Visit: Payer: Self-pay | Admitting: Medical

## 2015-11-16 MED ORDER — CYCLOBENZAPRINE HCL 10 MG PO TABS
10.0000 mg | ORAL_TABLET | Freq: Every evening | ORAL | Status: DC | PRN
Start: 2015-11-16 — End: 2017-01-29

## 2015-11-16 MED ORDER — ELETRIPTAN HYDROBROMIDE 40 MG PO TABS: ORAL_TABLET | ORAL | Status: DC

## 2015-11-23 ENCOUNTER — Telehealth: Payer: Self-pay | Admitting: Family Medicine

## 2015-11-23 NOTE — Telephone Encounter (Signed)
Left message that she needs to a flu shot

## 2016-01-11 ENCOUNTER — Other Ambulatory Visit: Payer: Self-pay | Admitting: Medical

## 2016-04-03 ENCOUNTER — Telehealth: Payer: Self-pay

## 2016-04-03 MED ORDER — HYDROCHLOROTHIAZIDE 12.5 MG PO CAPS: ORAL_CAPSULE | ORAL | Status: DC

## 2016-04-03 NOTE — Telephone Encounter (Signed)
Pharmacy called and stated that back in 12/2015 the rx came in but the "hard copy" did not so pt never got RX filled and she came in today to fill it and they asked if I would resend the rx.

## 2016-05-01 ENCOUNTER — Encounter: Payer: Self-pay | Admitting: Medical

## 2016-05-01 ENCOUNTER — Ambulatory Visit (INDEPENDENT_AMBULATORY_CARE_PROVIDER_SITE_OTHER): Payer: BC Managed Care – PPO | Admitting: Medical

## 2016-05-01 VITALS — BP 140/100 | HR 79 | Wt 160.0 lb

## 2016-05-01 DIAGNOSIS — R51 Headache: Secondary | ICD-10-CM | POA: Diagnosis not present

## 2016-05-01 DIAGNOSIS — I10 Essential (primary) hypertension: Secondary | ICD-10-CM | POA: Diagnosis not present

## 2016-05-01 DIAGNOSIS — E785 Hyperlipidemia, unspecified: Secondary | ICD-10-CM

## 2016-05-01 DIAGNOSIS — R519 Headache, unspecified: Secondary | ICD-10-CM

## 2016-05-01 LAB — COMPREHENSIVE METABOLIC PANEL
ALBUMIN: 4.1 g/dL (ref 3.6–5.1)
ALT: 22 U/L (ref 6–29)
AST: 20 U/L (ref 10–35)
Alkaline Phosphatase: 52 U/L (ref 33–130)
BUN: 11 mg/dL (ref 7–25)
CALCIUM: 9.4 mg/dL (ref 8.6–10.4)
CHLORIDE: 101 mmol/L (ref 98–110)
CO2: 27 mmol/L (ref 20–31)
CREATININE: 0.69 mg/dL (ref 0.50–1.05)
Glucose, Bld: 86 mg/dL (ref 65–99)
Potassium: 3.8 mmol/L (ref 3.5–5.3)
SODIUM: 138 mmol/L (ref 135–146)
Total Bilirubin: 0.3 mg/dL (ref 0.2–1.2)
Total Protein: 7.3 g/dL (ref 6.1–8.1)

## 2016-05-01 LAB — CBC
HCT: 36 % (ref 35.0–45.0)
HEMOGLOBIN: 12.2 g/dL (ref 11.7–15.5)
MCH: 29.7 pg (ref 27.0–33.0)
MCHC: 33.9 g/dL (ref 32.0–36.0)
MCV: 87.6 fL (ref 80.0–100.0)
MPV: 10.5 fL (ref 7.5–12.5)
Platelets: 216 10*3/uL (ref 140–400)
RBC: 4.11 MIL/uL (ref 3.80–5.10)
RDW: 13.4 % (ref 11.0–15.0)
WBC: 3.7 10*3/uL — AB (ref 4.0–10.5)

## 2016-05-01 LAB — POCT URINALYSIS DIPSTICK
Bilirubin, UA: NEGATIVE
GLUCOSE UA: NEGATIVE
KETONES UA: NEGATIVE
Leukocytes, UA: NEGATIVE
Nitrite, UA: NEGATIVE
Protein, UA: NEGATIVE
SPEC GRAV UA: 1.02
Urobilinogen, UA: NEGATIVE
pH, UA: 6.5

## 2016-05-01 MED ORDER — LOSARTAN POTASSIUM-HCTZ 50-12.5 MG PO TABS: 1.0000 | ORAL_TABLET | Freq: Every day | ORAL | Status: DC

## 2016-05-01 NOTE — Progress Notes (Signed)
Subjective: Chief Complaint  Patient presents with  . Hypertension    head is hurting, blurred vision, and pain behind eyes. was high at home 150-160/100 something   She reports BPs elevated the past week or so, but today has some blurred vision, some nausea, headache.  Taking HCTZ 12.5mg  daily.  At home seeing 150-180/100-120.  Not exercising as much currently as last check here.  Is careful about salt.  Has gained a few pounds of late.  Is careful eating out.  No new stress, but is under stress at work.  Denies chest pain, edema, no urination changes.  Gets asthma flare up mildly intermittent, no no DOE, ran 3 miles last week like normal without c/o.  No other aggravating or relieving factors. No other complaint.   Past Medical History  Diagnosis Date  . Allergy   . Hypertension   . Hyperlipidemia   . Asthma   . Migraine   . Nonalcoholic fatty liver disease   . GERD (gastroesophageal reflux disease)   . Fibromyalgia   . Chest pain 11/2009    overnight hospitalization  . Constipation     Dr. Loreta Ave   Family History  Problem Relation Age of Onset  . Cancer Mother     stage III, colon  . Hypertension Mother   . Dementia Mother   . Heart disease Mother     bradycardia  . Cancer Father     prostate  . Hypertension Father   . Hyperlipidemia Father   . Heart disease Father 57    MI   . Hyperlipidemia Sister   . Hypertension Sister   . Fibromyalgia Sister   . Arthritis Brother     RA  . Hypertension Brother   . Hyperlipidemia Brother   . Fibromyalgia Brother    Past Surgical History  Procedure Laterality Date  . Abdominal hysterectomy      total  . Wisdom tooth extraction    . Colonoscopy  09/15/2009    Dr. Loreta Ave; normal     Objective: BP 140/100 mmHg  Pulse 79  Wt 160 lb (72.576 kg)  BP Readings from Last 3 Encounters:  05/01/16 140/100  10/18/15 140/100  10/16/15 138/84   Wt Readings from Last 3 Encounters:  05/01/16 160 lb (72.576 kg)  10/18/15 157 lb  (71.215 kg)  09/29/15 149 lb (67.586 kg)   General appearance: alert, no distress, WD/WN  No papilledema Neck: supple, no lymphadenopathy, no thyromegaly, no masses, no bruits Heart: RRR, normal S1, S2, no murmurs Lungs: CTA bilaterally, no wheezes, rhonchi, or rales Ext: no edema Pulses: 2+ symmetric, upper and lower extremities, normal cap refill Neuro: non focal exam    Assessment: Encounter Diagnoses  Name Primary?  . Essential hypertension Yes  . Generalized headaches   . Hyperlipidemia      Plan: UA reviewed.  Advised weight loss, healthy diet, limit salt, c/t routine exercise, monitor BPs.  Stop HCTZ and begin Losartan HCT today.  Discussed symptoms and signs of hypertensive energy or other worrisome cardiac symptoms that would prompt recheck/urgent eval.   F/u pending labs.   Sherry Daniels was seen today for hypertension.  Diagnoses and all orders for this visit:  Essential hypertension -     CBC -     Comprehensive metabolic panel  Generalized headaches -     CBC -     Comprehensive metabolic panel  Hyperlipidemia -     CBC -     Comprehensive metabolic panel  Other  orders -     losartan-hydrochlorothiazide (HYZAAR) 50-12.5 MG tablet; Take 1 tablet by mouth daily.

## 2016-10-28 IMAGING — MG MM DIAGNOSTIC UNILATERAL R
3 series · 3 of 3 positions shown · non-contrast
Comparison: 09/10/2009

CLINICAL DATA: 53-year-old female, callback from screening
mammogram for possible right breast mass

EXAM:
DIGITAL DIAGNOSTIC  RIGHT MAMMOGRAM

[R CC (1 of 2)]
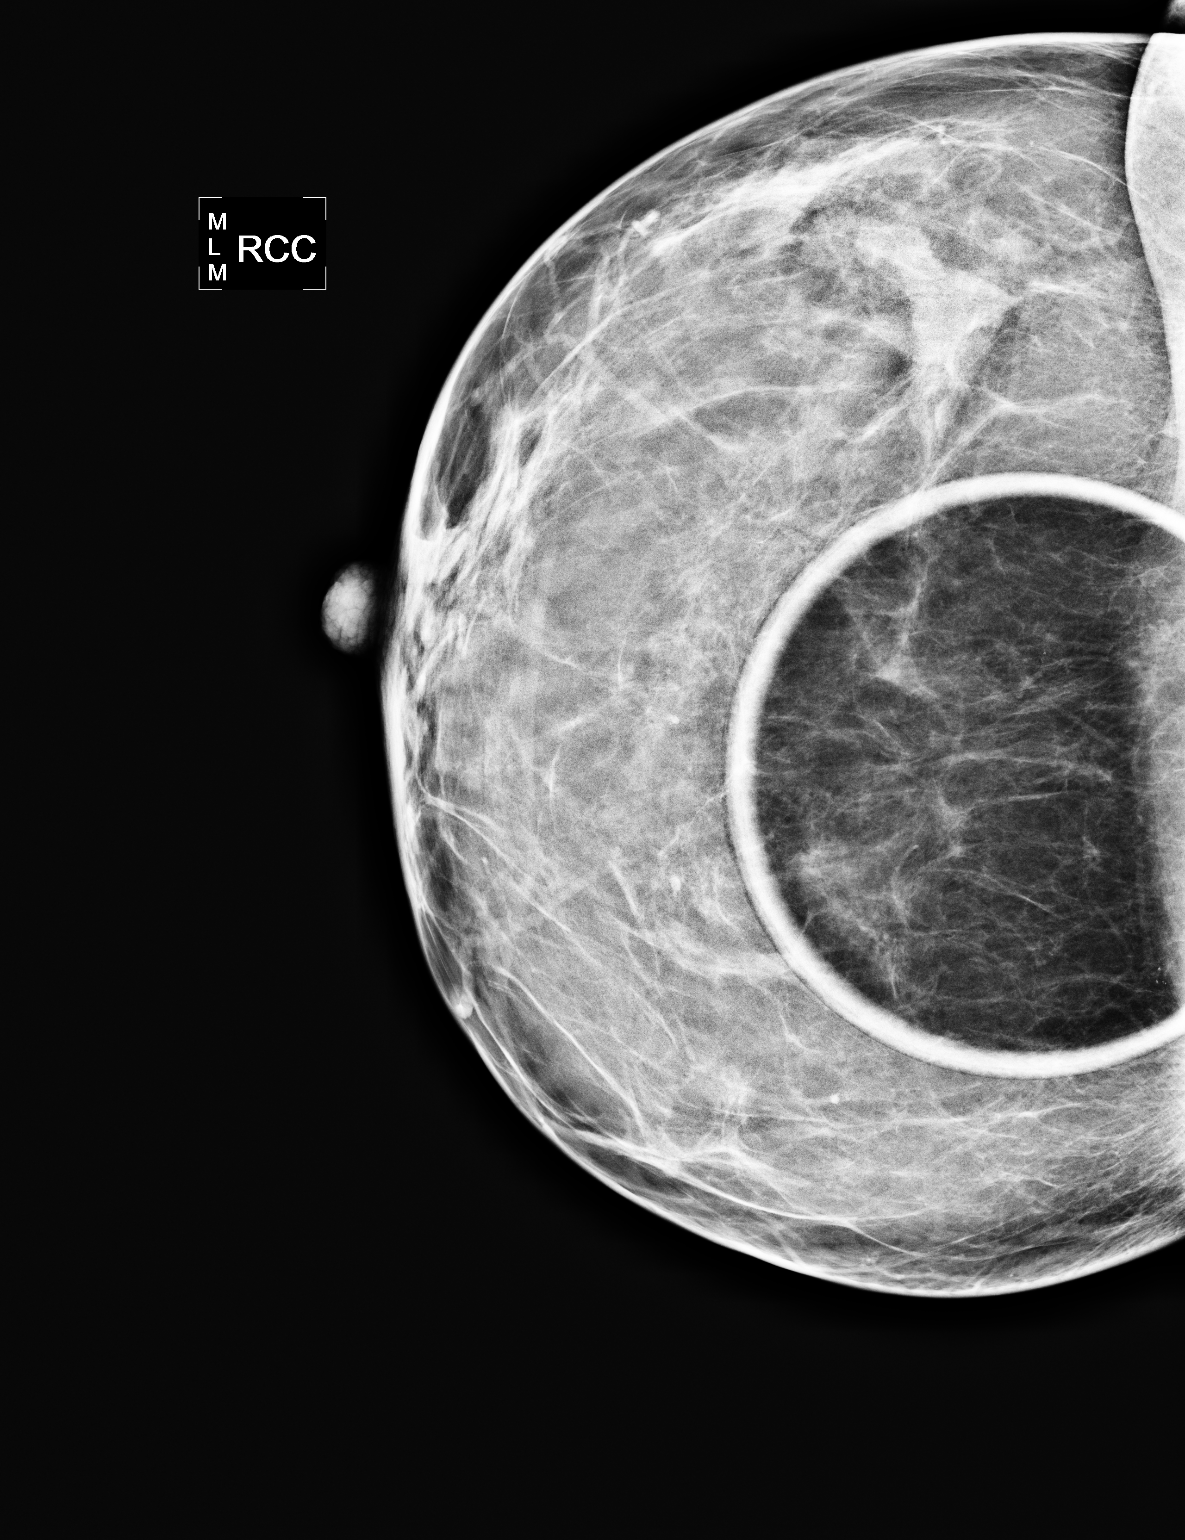

[R MLO]
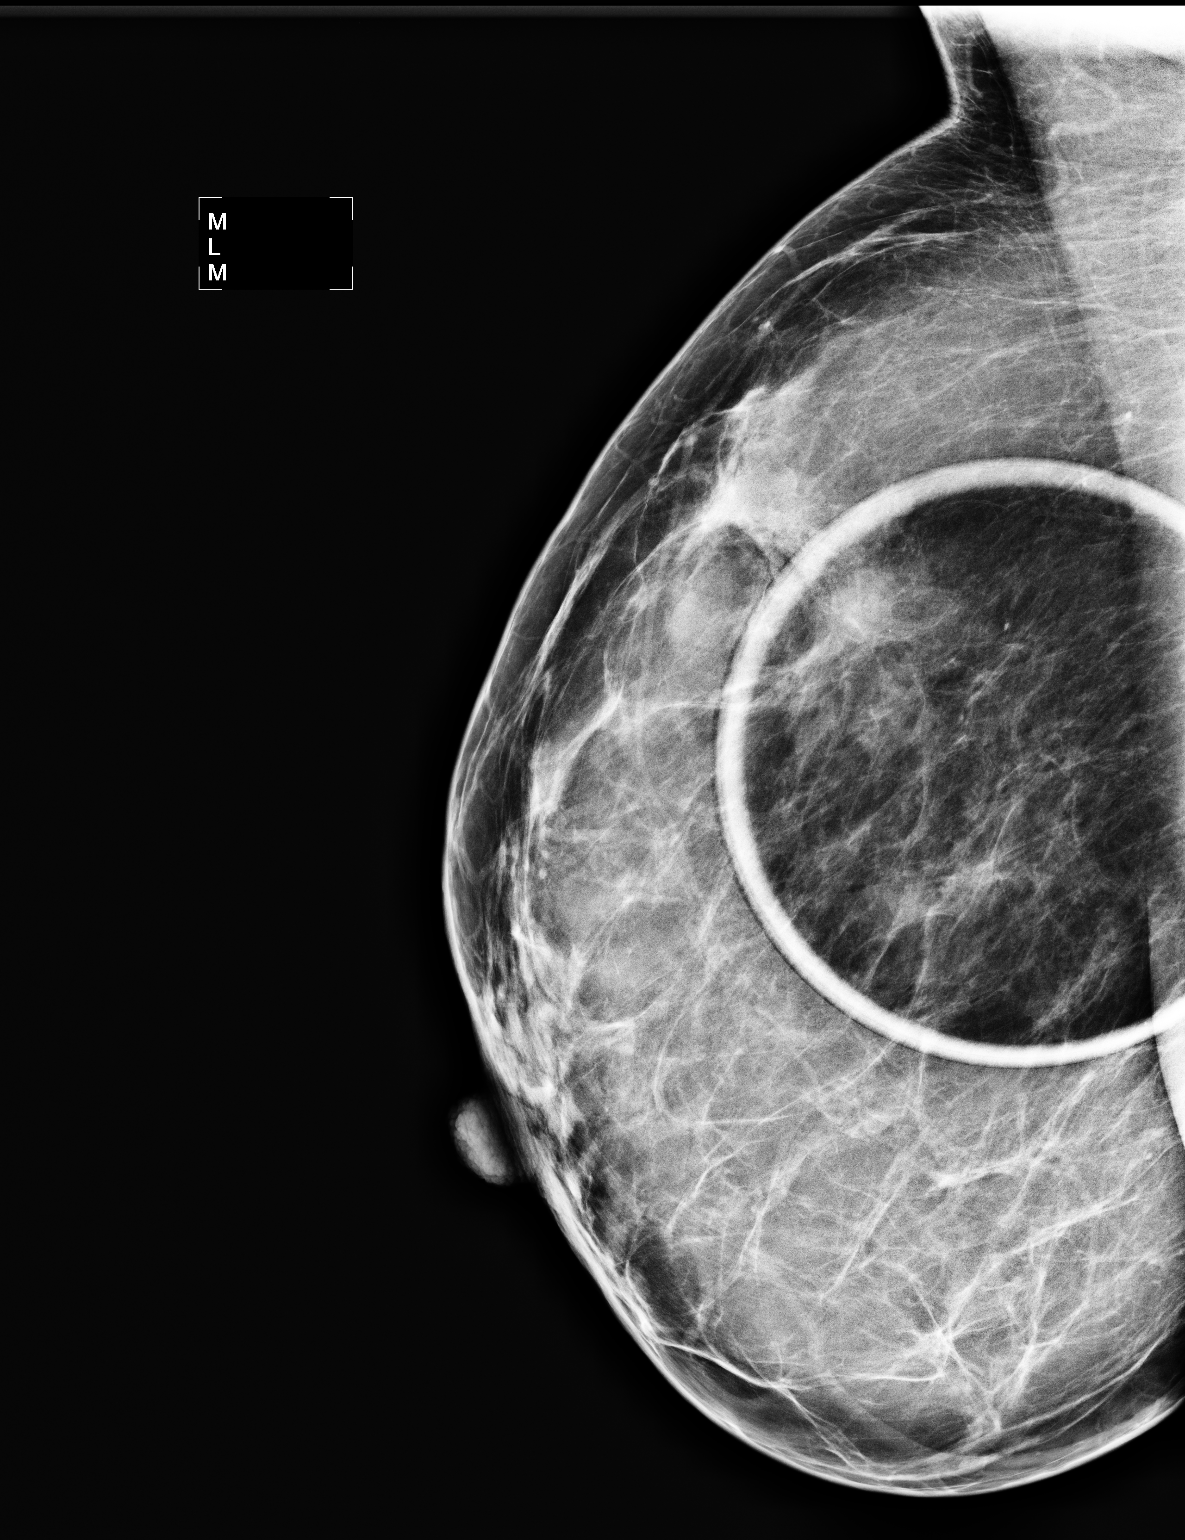

[R CC (2 of 2)]
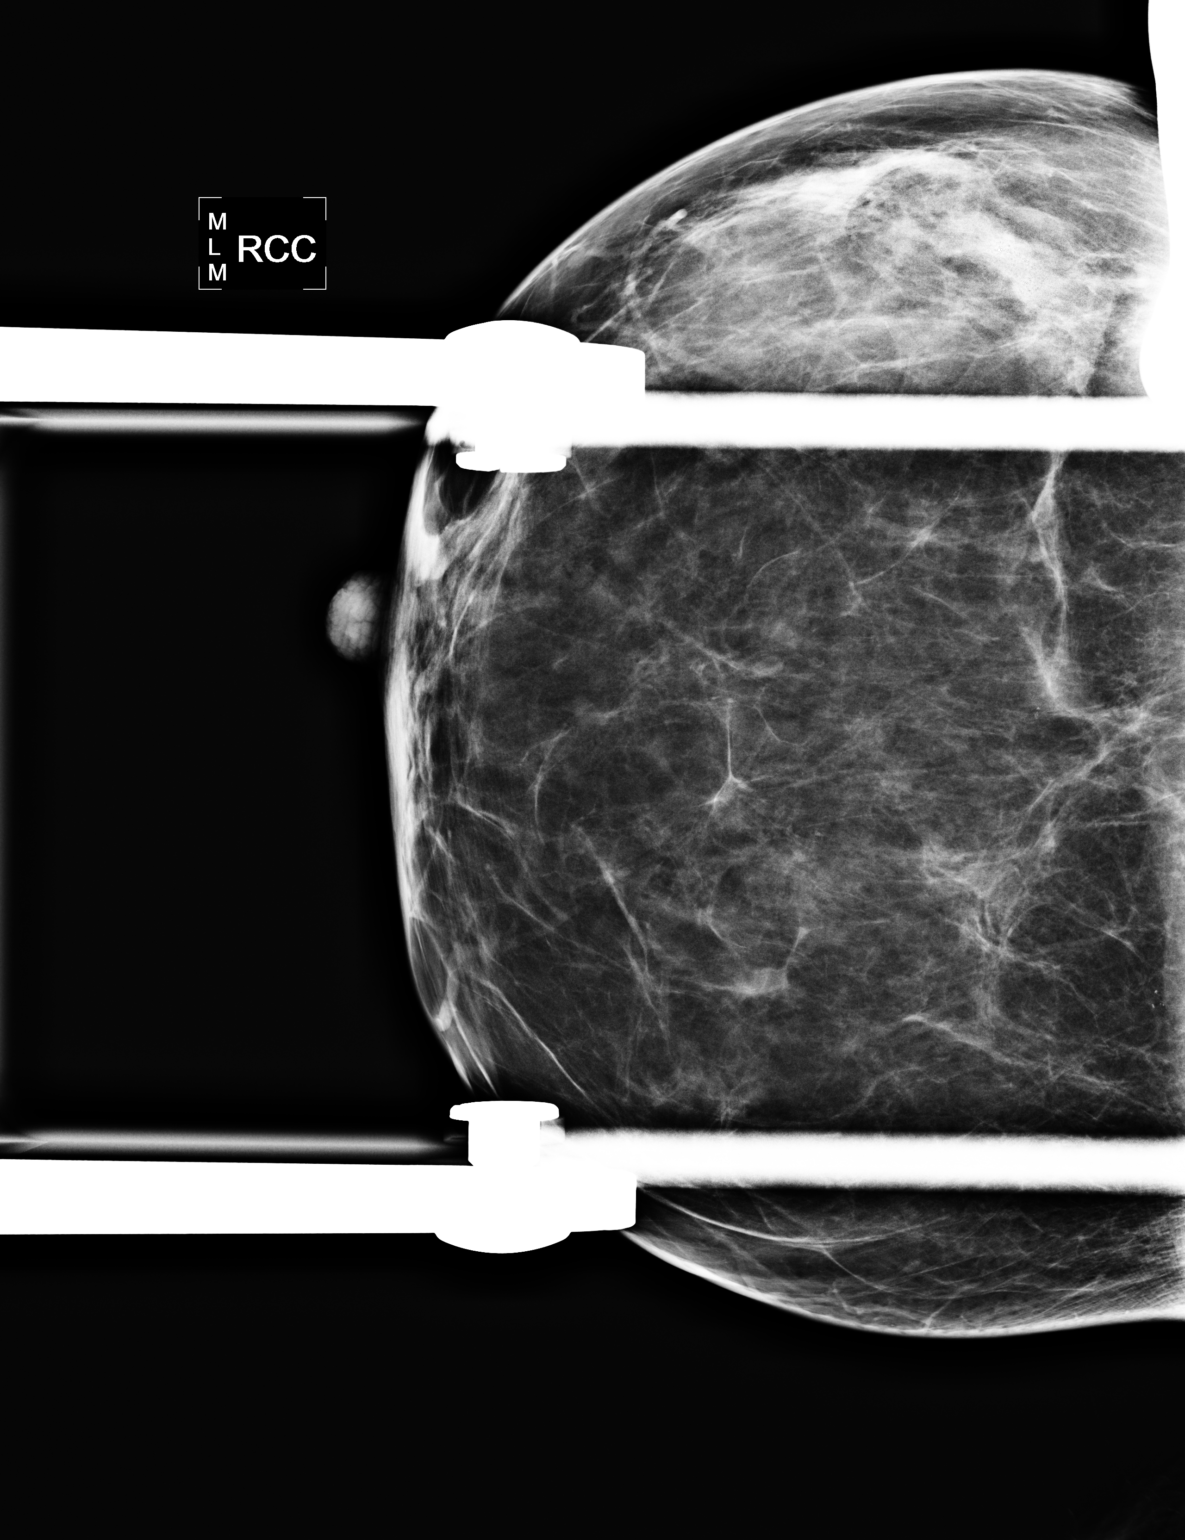

[3 of 3 positions shown; findings below may reference images not displayed]

ACR Breast Density Category c: The breast tissue is heterogeneously
dense, which may obscure small masses.
FINDINGS: On spot compression views, no suspicious mass, calcifications, or
distortion is seen in the region of possible mass seen on screening
mammogram. Findings on screening mammogram are thought to have
represented normal overlapping breast tissue. The breast parenchymal
pattern in this region is similar when compared to 4171.
IMPRESSION: No mammographic evidence of malignancy.

RECOMMENDATION:
Bilateral screening mammogram in August 2015.

I have discussed the findings and recommendations with the patient.
Results were also provided in writing at the conclusion of the
visit. If applicable, a reminder letter will be sent to the patient
regarding the next appointment.

BI-RADS CATEGORY  1: Negative.

## 2016-12-01 ENCOUNTER — Other Ambulatory Visit: Payer: Self-pay | Admitting: Medical

## 2017-01-29 ENCOUNTER — Encounter: Payer: Self-pay | Admitting: Family Medicine

## 2017-01-29 ENCOUNTER — Ambulatory Visit (INDEPENDENT_AMBULATORY_CARE_PROVIDER_SITE_OTHER): Payer: BC Managed Care – PPO | Admitting: Family Medicine

## 2017-01-29 VITALS — BP 138/90 | HR 82 | Temp 98.4°F | Resp 16 | Ht 66.0 in | Wt 166.0 lb

## 2017-01-29 DIAGNOSIS — M797 Fibromyalgia: Secondary | ICD-10-CM | POA: Diagnosis not present

## 2017-01-29 DIAGNOSIS — I10 Essential (primary) hypertension: Secondary | ICD-10-CM | POA: Diagnosis not present

## 2017-01-29 DIAGNOSIS — G43909 Migraine, unspecified, not intractable, without status migrainosus: Secondary | ICD-10-CM | POA: Insufficient documentation

## 2017-01-29 DIAGNOSIS — M2550 Pain in unspecified joint: Secondary | ICD-10-CM | POA: Diagnosis not present

## 2017-01-29 DIAGNOSIS — G43711 Chronic migraine without aura, intractable, with status migrainosus: Secondary | ICD-10-CM

## 2017-01-29 LAB — CBC WITH DIFFERENTIAL/PLATELET
BASOS ABS: 33 {cells}/uL (ref 0–200)
Basophils Relative: 1 %
Eosinophils Absolute: 198 cells/uL (ref 15–500)
Eosinophils Relative: 6 %
HEMATOCRIT: 35.9 % (ref 35.0–45.0)
Hemoglobin: 11.7 g/dL — ABNORMAL LOW (ref 12.0–15.0)
LYMPHS ABS: 1683 {cells}/uL (ref 850–3900)
Lymphocytes Relative: 51 %
MCH: 29.3 pg (ref 27.0–33.0)
MCHC: 32.6 g/dL (ref 32.0–36.0)
MCV: 90 fL (ref 80.0–100.0)
MONO ABS: 231 {cells}/uL (ref 200–950)
MPV: 10.3 fL (ref 7.5–12.5)
Monocytes Relative: 7 %
NEUTROS PCT: 35 %
Neutro Abs: 1155 cells/uL — ABNORMAL LOW (ref 1500–7800)
Platelets: 211 10*3/uL (ref 140–400)
RBC: 3.99 MIL/uL (ref 3.80–5.10)
RDW: 13.9 % (ref 11.0–15.0)
WBC: 3.3 10*3/uL — ABNORMAL LOW (ref 3.8–10.8)

## 2017-01-29 LAB — COMPREHENSIVE METABOLIC PANEL
ALBUMIN: 3.9 g/dL (ref 3.6–5.1)
ALT: 32 U/L — ABNORMAL HIGH (ref 6–29)
AST: 21 U/L (ref 10–35)
Alkaline Phosphatase: 53 U/L (ref 33–130)
BILIRUBIN TOTAL: 0.3 mg/dL (ref 0.2–1.2)
BUN: 14 mg/dL (ref 7–25)
CALCIUM: 9.2 mg/dL (ref 8.6–10.4)
CHLORIDE: 107 mmol/L (ref 98–110)
CO2: 25 mmol/L (ref 20–31)
Creat: 0.6 mg/dL (ref 0.50–1.05)
Glucose, Bld: 86 mg/dL (ref 70–99)
Potassium: 3.9 mmol/L (ref 3.5–5.3)
Sodium: 141 mmol/L (ref 135–146)
Total Protein: 6.8 g/dL (ref 6.1–8.1)

## 2017-01-29 LAB — TSH: TSH: 0.66 m[IU]/L

## 2017-01-29 MED ORDER — CYCLOBENZAPRINE HCL 10 MG PO TABS: 10.0000 mg | ORAL_TABLET | Freq: Every evening | ORAL | 0 refills | Status: DC | PRN

## 2017-01-29 MED ORDER — KETOROLAC TROMETHAMINE 60 MG/2ML IM SOLN
60.0000 mg | Freq: Once | INTRAMUSCULAR | Status: AC
  Administered 2017-01-29: 60 mg via INTRAMUSCULAR

## 2017-01-29 MED ORDER — ELETRIPTAN HYDROBROMIDE 40 MG PO TABS: ORAL_TABLET | ORAL | 2 refills | Status: DC

## 2017-01-29 MED ORDER — LOSARTAN POTASSIUM-HCTZ 50-12.5 MG PO TABS: 1.0000 | ORAL_TABLET | Freq: Every day | ORAL | 2 refills | Status: DC

## 2017-01-29 MED ORDER — PROMETHAZINE HCL 12.5 MG PO TABS: 12.5000 mg | ORAL_TABLET | Freq: Three times a day (TID) | ORAL | 0 refills | Status: DC | PRN

## 2017-01-29 MED ORDER — ALPRAZOLAM 0.5 MG PO TABS: ORAL_TABLET | ORAL | 1 refills | Status: DC

## 2017-01-29 NOTE — Patient Instructions (Signed)
Toradol injection given  We will call with lab results Schedule physical within the next few months

## 2017-01-29 NOTE — Progress Notes (Signed)
Subjective:    Patient ID: Sherry HampshireValerie R Derousse, female    DOB: [birth year], 56 y.o.   MRN: [id number]  Patient presents for Frequent HA (states that she needs refill on Relpax) and Fibromyalgia Issues (states that she has blisters in mouth, flare of IBS, and severe joint pain)   Pt here to establish care- previous PCP Dr. Aleen Campiysinger in [place], this is closer   Dr. Aldona BarWEIN GYN   Pt here with migraines - takes benadryl/ tylenol/ uses Relpax but out of the medication , had migraine since Friday , migraines  Started 10-15 years ago,  Also has phenergan as needed    IBS- has had for > 5 years ago, has intermittant diarrhea and constipation  - Dr.Jyoithi Mann     Miralax/Probiotics    Gets blisters on and off,In her mouth during her flares also has  joint pain on and off, knees/legs/ankles-which feels different from her fibromyalgia which is more into the muscles- feels some swelling into joints during the flares and with fatigue , concerned about lupus does not think she was tested for this    Hypertension- > 10 years on HCTZ/losartan    Fibromyalgia- had evaluation which was negative.> 5  Years ago , rheumatology declines to see - has been on cymbalta had side effects has not tried gabapentin, elavil or lyrica      Hyperlipiudemia- diet controlled/ NASH      Anxiety- uses as needed    Out of most of her meds    Father/Brother- has cluster migraines  Siblings- have fibromyalgia Youngest Brother has RA  Father - Parkinsons   Review Of Systems:  GEN- denies fatigue, fever, weight loss,weakness, recent illness HEENT- denies eye drainage, change in vision, nasal discharge, CVS- denies chest pain, palpitations RESP- denies SOB, cough, wheeze ABD- denies N/V, change in stools, abd pain GU- denies dysuria, hematuria, dribbling, incontinence MSK- denies joint pain, muscle aches, injury Neuro-+ headache, +dizziness, syncope, seizure activity       Objective:    BP 138/90   Pulse 82    Temp 98.4 F (36.9 C) (Oral)   Resp 16   Ht 5\' 6"  (1.676 m)   Wt 166 lb (75.3 kg)   SpO2 98%   BMI 26.79 kg/m  GEN- NAD, alert and oriented x3 HEENT- PERRL, EOMI, non injected sclera, pink conjunctiva, MMM, oropharynx clear, TM clear bilat no effusion Neck- Supple, no thyromegaly CVS- RRR, no murmur RESP-CTAB ABD-NABS,soft,NT,ND NEURO-CNII-XII in tact no deficits  Psych- Normal affect and mood EXT- No edema Pulses- Radial, DP- 2+        Assessment & Plan:      Problem List Items Addressed This Visit    Polyarthralgia    We'll check ANA and rheumatoid factors this does run in her family. She does have underlying fibromyalgia which makes this very difficult also has concurrent migraines and irritable bowel syndrome which is often seen with the fibromyalgia picture.      Relevant Orders   Sedimentation Rate   C-reactive protein   ANA   Rheumatoid factor   Migraines    I given her prescription for Relpax today along with Phenergan and she is not on any prophylactic medication such as Topamax.      Relevant Medications   cyclobenzaprine (FLEXERIL) 10 MG tablet   eletriptan (RELPAX) 40 MG tablet   losartan-hydrochlorothiazide (HYZAAR) 50-12.5 MG tablet   ketorolac (TORADOL) injection 60 mg (Completed)   Fibromyalgia - Primary   Essential hypertension,  benign    Pressure controlled with losartan HCTZ      Relevant Medications   losartan-hydrochlorothiazide (HYZAAR) 50-12.5 MG tablet   Other Relevant Orders   CBC with Differential/Platelet   Comprehensive metabolic panel   TSH      Note: This dictation was prepared with Dragon dictation along with smaller phrase technology. Any transcriptional errors that result from this process are unintentional.

## 2017-01-29 NOTE — Assessment & Plan Note (Signed)
We'll check ANA and rheumatoid factors this does run in her family. She does have underlying fibromyalgia which makes this very difficult also has concurrent migraines and irritable bowel syndrome which is often seen with the fibromyalgia picture.

## 2017-01-29 NOTE — Assessment & Plan Note (Signed)
I given her prescription for Relpax today along with Phenergan and she is not on any prophylactic medication such as Topamax.

## 2017-01-29 NOTE — Assessment & Plan Note (Signed)
Pressure controlled with losartan HCTZ

## 2017-01-30 LAB — ANA: Anti Nuclear Antibody(ANA): NEGATIVE

## 2017-01-30 LAB — SEDIMENTATION RATE: SED RATE: 12 mm/h (ref 0–30)

## 2017-01-30 LAB — C-REACTIVE PROTEIN: CRP: 1.3 mg/L (ref <8.0)

## 2017-01-30 LAB — RHEUMATOID FACTOR: Rhuematoid fact SerPl-aCnc: <14 IU/mL (ref <14)

## 2017-02-04 ENCOUNTER — Encounter: Payer: Self-pay | Admitting: Family Medicine

## 2017-02-05 MED ORDER — DICLOFENAC SODIUM 1 % TD GEL: 2.0000 g | Freq: Four times a day (QID) | TRANSDERMAL | 3 refills | Status: DC

## 2017-02-07 ENCOUNTER — Telehealth: Payer: Self-pay | Admitting: *Deleted

## 2017-02-07 MED ORDER — DICLOFENAC SODIUM 1 % TD GEL: 2.0000 g | Freq: Four times a day (QID) | TRANSDERMAL | 3 refills | Status: DC

## 2017-02-07 NOTE — Telephone Encounter (Signed)
Received PA determination.   PA approved 02/07/2017- 02/08/2020.  Pharmacy made aware.

## 2017-02-07 NOTE — Telephone Encounter (Signed)
Received request from pharmacy for PA on Voltaren Gel.   PA submitted.   Dx: M19.9- OA

## 2017-02-07 NOTE — Telephone Encounter (Signed)
Your information has been submitted to [employer]. To check for an updated outcome later, reopen this PA request from your dashboard. If [employer] has not responded to your request within 24 hours, contact [employer] at [phone]. If you think there may be a problem with your PA request, use our live chat feature at the bottom right.    

## 2017-02-22 ENCOUNTER — Encounter: Payer: BC Managed Care – PPO | Admitting: Family Medicine

## 2017-03-09 ENCOUNTER — Ambulatory Visit (INDEPENDENT_AMBULATORY_CARE_PROVIDER_SITE_OTHER): Payer: BC Managed Care – PPO | Admitting: Family Medicine

## 2017-03-09 VITALS — BP 160/104 | HR 76 | Temp 97.8°F | Resp 14 | Ht 66.0 in | Wt 166.0 lb

## 2017-03-09 DIAGNOSIS — M79641 Pain in right hand: Secondary | ICD-10-CM | POA: Diagnosis not present

## 2017-03-09 DIAGNOSIS — Z0000 Encounter for general adult medical examination without abnormal findings: Secondary | ICD-10-CM

## 2017-03-09 DIAGNOSIS — M797 Fibromyalgia: Secondary | ICD-10-CM | POA: Diagnosis not present

## 2017-03-09 DIAGNOSIS — I10 Essential (primary) hypertension: Secondary | ICD-10-CM | POA: Diagnosis not present

## 2017-03-09 DIAGNOSIS — E7800 Pure hypercholesterolemia, unspecified: Secondary | ICD-10-CM

## 2017-03-09 DIAGNOSIS — G43711 Chronic migraine without aura, intractable, with status migrainosus: Secondary | ICD-10-CM | POA: Diagnosis not present

## 2017-03-09 MED ORDER — CYCLOBENZAPRINE HCL 10 MG PO TABS: 10.0000 mg | ORAL_TABLET | Freq: Every evening | ORAL | 0 refills | Status: DC | PRN

## 2017-03-09 MED ORDER — DICLOFENAC SODIUM 1 % TD GEL: 2.0000 g | Freq: Four times a day (QID) | TRANSDERMAL | 3 refills | Status: DC

## 2017-03-09 MED ORDER — PROPRANOLOL HCL ER 80 MG PO CP24: 80.0000 mg | ORAL_CAPSULE | Freq: Every day | ORAL | 5 refills | Status: DC

## 2017-03-09 NOTE — Progress Notes (Signed)
Subjective:    Patient ID: Sherry Daniels, female    DOB: July 26, 1961, 56 y.o.   MRN: [id number]  HPI  Here for CPE and to reestablish care.  Has pmh of migraines, firbomyalgia, ess htn.  Sees GYN for mammogram, pap smear.  Last colonosopy was 2016.  Due for tetanus shot.  Dues for shingles shot.  Would like to check price on Singles shot first.  Saw Dr. Jeanice Lim for migraines.  Has 4-5 bad migraines every month.  Also, was screened for lupus, rheumatoid arthritis at last ov.  Labs were normal.  However, still reports pain and stiffness in her hands right>left.  She attributes it to fibromyalgia.  Also, home bp tends to range between 140-150 systolic.   Past Medical History:  Diagnosis Date  . Allergy   . Asthma   . Chest pain 11/2009   overnight hospitalization  . Constipation    Dr. Loreta Ave  . Fibromyalgia   . GERD (gastroesophageal reflux disease)   . Hyperlipidemia   . Hypertension   . Migraine   . Nonalcoholic fatty liver disease    Past Surgical History:  Procedure Laterality Date  . ABDOMINAL HYSTERECTOMY     total  . COLONOSCOPY  09/15/2009   Dr. Loreta Ave; normal  . WISDOM TOOTH EXTRACTION     Current Outpatient Prescriptions on File Prior to Visit  Medication Sig Dispense Refill  . acetaminophen (TYLENOL) 500 MG tablet Take 1,500 mg by mouth every 6 (six) hours as needed. Reported on 05/01/2016    . ALPRAZolam (XANAX) 0.5 MG tablet TAKE 1 TABLET BY MOUTH AT BEDTIME AS NEEDED FOR ANXIETY 30 tablet 1  . aspirin 81 MG EC tablet 1 tablet po qOD 30 tablet 12  . eletriptan (RELPAX) 40 MG tablet TAKE 1 TAB AT ONSET OF HEADACHE.MAY REPEAT IN 2HRS IF HEADACHE PERSISTS 10 tablet 2  . estradiol (VIVELLE-DOT) 0.05 MG/24HR patch Place 1 patch onto the skin 2 (two) times a week.   9  . ibuprofen (ADVIL,MOTRIN) 600 MG tablet Take 1 tablet (600 mg total) by mouth every 6 (six) hours as needed. 30 tablet 0  . losartan-hydrochlorothiazide (HYZAAR) 50-12.5 MG tablet Take 1 tablet by mouth daily. 30  tablet 2  . Multiple Vitamins-Minerals (MULTIVITAMIN WITH MINERALS) tablet Take 1 tablet by mouth daily.    . promethazine (PHENERGAN) 12.5 MG tablet Take 1 tablet (12.5 mg total) by mouth every 8 (eight) hours as needed for nausea or vomiting. 20 tablet 0   No current facility-administered medications on file prior to visit.    Allergies  Allergen Reactions  . Aspirin Nausea Only  . Cymbalta [Duloxetine Hcl]     Felt like it worsened anxiety and fibromyalgia  . Simvastatin Other (See Comments)    dizziness   Social History   Social History  . Marital status: Married    Spouse name: N/A  . Number of children: N/A  . Years of education: N/A   Occupational History  . supervise principles in school system [employer]   Social History Main Topics  . Smoking status: Never Smoker  . Smokeless tobacco: Never Used  . Alcohol use No  . Drug use: No  . Sexual activity: Yes    Birth control/ protection: Surgical   Other Topics Concern  . Not on file   Social History Narrative   Married, 1 daughter and 1 son, pentecostal, exercise most days, walking, biking, running.    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 Cancer Mother     stage III, colon  . Hypertension Mother   . Dementia Mother   . Heart disease Mother     bradycardia  . Cancer Father     prostate  . Hypertension Father   . Hyperlipidemia Father   . Heart disease Father 71    MI   . Hyperlipidemia Sister   . Hypertension Sister   . Fibromyalgia Sister   . Arthritis Brother     RA  . Hypertension Brother   . Hyperlipidemia Brother   . Fibromyalgia Brother        Review of Systems  All other systems reviewed and are negative.      Objective:   Physical Exam  Constitutional: She is oriented to person, place, and time. She appears well-developed and well-nourished. No distress.  HENT:  Head: Normocephalic and atraumatic.  Right Ear:  External ear normal.  Left Ear: External ear normal.  Nose: Nose normal.  Mouth/Throat: Oropharynx is clear and moist. No oropharyngeal exudate.  Eyes: Conjunctivae and EOM are normal. Pupils are equal, round, and reactive to light. Right eye exhibits no discharge. Left eye exhibits no discharge. No scleral icterus.  Neck: Normal range of motion. Neck supple. No JVD present. No tracheal deviation present. No thyromegaly present.  Cardiovascular: Normal rate, regular rhythm, normal heart sounds and intact distal pulses.  Exam reveals no gallop and no friction rub.   No murmur heard. Pulmonary/Chest: Effort normal and breath sounds normal. No stridor. No respiratory distress. She has no wheezes. She has no rales. She exhibits no tenderness.  Abdominal: Soft. Bowel sounds are normal. She exhibits no distension and no mass. There is no tenderness. There is no rebound and no guarding.  Musculoskeletal: Normal range of motion. She exhibits no edema, tenderness or deformity.  Lymphadenopathy:    She has no cervical adenopathy.  Neurological: She is alert and oriented to person, place, and time. She has normal reflexes. She displays normal reflexes. No cranial nerve deficit. She exhibits normal muscle tone. Coordination normal.  Skin: Skin is warm. No rash noted. She is not diaphoretic. No erythema. No pallor.  Psychiatric: She has a normal mood and affect. Her behavior is normal. Judgment and thought content normal.  Vitals reviewed.         Assessment & Plan:  Hand pain, right - Plan: DG Hand Complete Right  Fibromyalgia  Essential hypertension, benign  Intractable chronic migraine without aura and with status migrainosus  Pure hypercholesterolemia - Plan: CBC with Differential/Platelet, COMPLETE METABOLIC PANEL WITH GFR, Lipid panel  General medical exam - Plan: CBC with Differential/Platelet, COMPLETE METABOLIC PANEL WITH GFR, Lipid panel, TSH  I suspect hand pain may be due to  osteoarthritis.  I will obtain xray of the right hand to evaluate further.  Return fasting for cbc, cmp, flp, and TSH.  Mammogram, pap are done at GYN.  Colonoscopy is utd.  Suggested propranolo er 80 mg poqday both for blood pressure control and to hopefully help decrease frequency of migraines.  Recheck BP in 1 month.

## 2017-03-29 ENCOUNTER — Other Ambulatory Visit: Payer: BC Managed Care – PPO

## 2017-03-29 ENCOUNTER — Other Ambulatory Visit: Payer: Self-pay | Admitting: Family Medicine

## 2017-03-29 LAB — COMPLETE METABOLIC PANEL WITH GFR
ALBUMIN: 3.6 g/dL (ref 3.6–5.1)
ALK PHOS: 66 U/L (ref 33–130)
ALT: 56 U/L — ABNORMAL HIGH (ref 6–29)
AST: 33 U/L (ref 10–35)
BILIRUBIN TOTAL: 0.3 mg/dL (ref 0.2–1.2)
BUN: 13 mg/dL (ref 7–25)
CALCIUM: 8.9 mg/dL (ref 8.6–10.4)
CO2: 27 mmol/L (ref 20–31)
Chloride: 106 mmol/L (ref 98–110)
Creat: 0.64 mg/dL (ref 0.50–1.05)
Glucose, Bld: 89 mg/dL (ref 70–99)
POTASSIUM: 3.6 mmol/L (ref 3.5–5.3)
SODIUM: 143 mmol/L (ref 135–146)
Total Protein: 6.3 g/dL (ref 6.1–8.1)

## 2017-03-29 LAB — LIPID PANEL
CHOL/HDL RATIO: 2.6 ratio (ref <5.0)
CHOLESTEROL: 223 mg/dL — AB (ref <200)
HDL: 85 mg/dL (ref >50)
LDL Cholesterol: 120 mg/dL — ABNORMAL HIGH (ref <100)
TRIGLYCERIDES: 90 mg/dL (ref <150)
VLDL: 18 mg/dL (ref <30)

## 2017-03-29 LAB — CBC WITH DIFFERENTIAL/PLATELET
Basophils Absolute: 38 cells/uL (ref 0–200)
Basophils Relative: 1 %
EOS ABS: 190 {cells}/uL (ref 15–500)
Eosinophils Relative: 5 %
HEMATOCRIT: 34.5 % — AB (ref 35.0–45.0)
Hemoglobin: 11.2 g/dL — ABNORMAL LOW (ref 12.0–15.0)
LYMPHS PCT: 51 %
Lymphs Abs: 1938 cells/uL (ref 850–3900)
MCH: 29.3 pg (ref 27.0–33.0)
MCHC: 32.5 g/dL (ref 32.0–36.0)
MCV: 90.3 fL (ref 80.0–100.0)
MPV: 10 fL (ref 7.5–12.5)
Monocytes Absolute: 266 cells/uL (ref 200–950)
Monocytes Relative: 7 %
Neutro Abs: 1368 cells/uL — ABNORMAL LOW (ref 1500–7800)
Neutrophils Relative %: 36 %
Platelets: 214 10*3/uL (ref 140–400)
RBC: 3.82 MIL/uL (ref 3.80–5.10)
RDW: 14 % (ref 11.0–15.0)
WBC: 3.8 10*3/uL (ref 3.8–10.8)

## 2017-03-29 LAB — TSH: TSH: 0.94 mIU/L

## 2017-03-30 ENCOUNTER — Encounter: Payer: Self-pay | Admitting: Family Medicine

## 2017-03-31 LAB — IRON: Iron: 61 ug/dL (ref 45–160)

## 2017-03-31 LAB — VITAMIN B12: VITAMIN B 12: 305 pg/mL (ref 200–1100)

## 2017-04-02 ENCOUNTER — Encounter: Payer: Self-pay | Admitting: Family Medicine

## 2017-04-05 ENCOUNTER — Other Ambulatory Visit: Payer: Self-pay | Admitting: Gastroenterology

## 2017-04-05 DIAGNOSIS — R110 Nausea: Secondary | ICD-10-CM

## 2017-04-05 DIAGNOSIS — R1011 Right upper quadrant pain: Secondary | ICD-10-CM

## 2017-04-07 ENCOUNTER — Encounter (HOSPITAL_COMMUNITY): Payer: Self-pay | Admitting: *Deleted

## 2017-04-07 ENCOUNTER — Emergency Department (HOSPITAL_COMMUNITY): Payer: BC Managed Care – PPO

## 2017-04-07 ENCOUNTER — Emergency Department (HOSPITAL_COMMUNITY)
Admission: EM | Admit: 2017-04-07 | Discharge: 2017-04-07 | Disposition: A | Discharge status: Home / Self Care | Payer: BC Managed Care – PPO | Attending: Emergency Medicine | Admitting: Emergency Medicine

## 2017-04-07 DIAGNOSIS — Z79899 Other long term (current) drug therapy: Secondary | ICD-10-CM | POA: Diagnosis not present

## 2017-04-07 DIAGNOSIS — R079 Chest pain, unspecified: Secondary | ICD-10-CM | POA: Diagnosis not present

## 2017-04-07 DIAGNOSIS — R42 Dizziness and giddiness: Secondary | ICD-10-CM

## 2017-04-07 DIAGNOSIS — J45909 Unspecified asthma, uncomplicated: Secondary | ICD-10-CM | POA: Insufficient documentation

## 2017-04-07 DIAGNOSIS — Z7982 Long term (current) use of aspirin: Secondary | ICD-10-CM | POA: Diagnosis not present

## 2017-04-07 DIAGNOSIS — I10 Essential (primary) hypertension: Secondary | ICD-10-CM | POA: Insufficient documentation

## 2017-04-07 LAB — I-STAT TROPONIN, ED
TROPONIN I, POC: 0 ng/mL (ref 0.00–0.08)
Troponin i, poc: 0 ng/mL (ref 0.00–0.08)

## 2017-04-07 LAB — BASIC METABOLIC PANEL
Anion gap: 6 (ref 5–15)
BUN: 10 mg/dL (ref 6–20)
CHLORIDE: 103 mmol/L (ref 101–111)
CO2: 28 mmol/L (ref 22–32)
CREATININE: 0.71 mg/dL (ref 0.44–1.00)
Calcium: 9.5 mg/dL (ref 8.9–10.3)
GFR calc Af Amer: >60 mL/min (ref >60)
GFR calc non Af Amer: >60 mL/min (ref >60)
Glucose, Bld: 95 mg/dL (ref 65–99)
Potassium: 3.9 mmol/L (ref 3.5–5.1)
SODIUM: 137 mmol/L (ref 135–145)

## 2017-04-07 LAB — CBC WITH DIFFERENTIAL/PLATELET
BASOS PCT: 0 %
Basophils Absolute: 0 10*3/uL (ref 0.0–0.1)
EOS ABS: 0.2 10*3/uL (ref 0.0–0.7)
EOS PCT: 3 %
HCT: 35.6 % — ABNORMAL LOW (ref 36.0–46.0)
HEMOGLOBIN: 11.6 g/dL — AB (ref 12.0–15.0)
LYMPHS ABS: 2.5 10*3/uL (ref 0.7–4.0)
Lymphocytes Relative: 39 %
MCH: 29.3 pg (ref 26.0–34.0)
MCHC: 32.6 g/dL (ref 30.0–36.0)
MCV: 89.9 fL (ref 78.0–100.0)
MONO ABS: 0.4 10*3/uL (ref 0.1–1.0)
Monocytes Relative: 7 %
Neutro Abs: 3.3 10*3/uL (ref 1.7–7.7)
Neutrophils Relative %: 51 %
PLATELETS: 235 10*3/uL (ref 150–400)
RBC: 3.96 MIL/uL (ref 3.87–5.11)
RDW: 13.1 % (ref 11.5–15.5)
WBC: 6.5 10*3/uL (ref 4.0–10.5)

## 2017-04-07 MED ORDER — SODIUM CHLORIDE 0.9 % IV BOLUS (SEPSIS)
1000.0000 mL | Freq: Once | INTRAVENOUS | Status: AC
  Administered 2017-04-07: 1000 mL via INTRAVENOUS

## 2017-04-07 MED ORDER — ONDANSETRON HCL 4 MG/2ML IJ SOLN
4.0000 mg | Freq: Once | INTRAMUSCULAR | Status: AC
  Administered 2017-04-07: 4 mg via INTRAVENOUS
  Filled 2017-04-07: qty 2

## 2017-04-07 NOTE — Discharge Instructions (Signed)
Your cardiac workup here was negative. I do recommend that you continue monitoring your blood pressure closely at home. Follow-up with your primary care doctor about her medications. Return here for any new or worsening symptoms including chest pain, shortness of breath, dizziness, syncope, etc.

## 2017-04-07 NOTE — ED Notes (Signed)
Pt transported to xray 

## 2017-04-07 NOTE — ED Triage Notes (Signed)
To ED for eval of weakness and cp. States her MD added HTN med 3 wks ago and yesterday while at an outside graduation she felt weak with mild cp. States same through the night. Appears in nad

## 2017-04-07 NOTE — ED Notes (Signed)
Dr. Ray in room. 

## 2017-04-07 NOTE — ED Provider Notes (Signed)
[facility]-EMERGENCY DEPT Provider Note   CSN: [id number] Arrival date & time: 04/07/17  1102     History   Chief Complaint Chief Complaint  Patient presents with  . Chest Pain  . Weakness    HPI Sherry Daniels is a 56 y.o. female.  The history is provided by the patient and medical records.  Chest Pain   Associated symptoms include weakness.  Weakness  Associated symptoms include chest pain.    56 year old female with history of allergies, asthma, fibromyalgia, GERD, hyperlipidemia, hypertension, migraine headaches, fatty liver, presenting to the ED with multitude of complaints.  Patient reports she recently had a checkup with her PCP and had blood work done which revealed a B12 iron deficiency anemia. States she was started on supplementation for this, was also started on a second blood pressure medicine due to continued high numbers.  States since starting the B12 supplements, she has been feeling somewhat nauseated.  States yesterday she was at an outdoor graduation in the heat and began feeling lightheaded, increasingly nauseated, as if she may pass out. States she was assisted to EMS truck with paramedics and was given oral fluids and kept in air-conditioning for a while and she began to improve. States she went home yesterday but continued feeling weak. States yesterday her blood pressure was in the 90s systolic (states generally she was around 150's systolic prior to second medication being added).  Last evening she went home and continued drinking Gatorade and other oral fluids and use feeling weak and "foggy headed". She's not had any syncopal events. States today she continues to feel somewhat nauseated. States she has noticed some mild left-sided chest discomfort which radiates into her left shoulder as well as some "tingling" on left side of her face. She has no known cardiac hx.  States she was seen here a few years ago for chest pain and referred to cardiology-- stress test and  holter monitoring was normal.  She is not a smoker.  Past Medical History:  Diagnosis Date  . Allergy   . Asthma   . Chest pain 11/2009   overnight hospitalization  . Constipation    Dr. Loreta AveMann  . Fibromyalgia   . GERD (gastroesophageal reflux disease)   . Hyperlipidemia   . Hypertension   . Migraine   . Nonalcoholic fatty liver disease     Patient Active Problem List   Diagnosis Date Noted  . Migraines 01/29/2017  . Anemia 11/24/2011  . Headache(784.0) 11/24/2011  . Nonalcoholic fatty liver disease 11/08/2011  . Essential hypertension, benign 10/12/2011  . Hyperlipidemia 10/12/2011  . GERD (gastroesophageal reflux disease) 10/12/2011  . Fibromyalgia 10/12/2011  . Polyarthralgia 10/12/2011  . Allergy     Past Surgical History:  Procedure Laterality Date  . ABDOMINAL HYSTERECTOMY     total  . COLONOSCOPY  09/15/2009   Dr. Loreta AveMann; normal  . WISDOM TOOTH EXTRACTION      OB History    No data available       Home Medications    Prior to Admission medications   Medication Sig Start Date End Date Taking? Authorizing Provider  acetaminophen (TYLENOL) 500 MG tablet Take 1,500 mg by mouth every 6 (six) hours as needed. Reported on 05/01/2016    [provider]  ALPRAZolam Prudy Feeler(XANAX) 0.5 MG tablet TAKE 1 TABLET BY MOUTH AT BEDTIME AS NEEDED FOR ANXIETY 01/29/17   Salley Scarleturham, Kawanta F, MD  aspirin 81 MG EC tablet 1 tablet po qOD 07/05/12   Tysinger,  Kermit Balo, PA-C  cyclobenzaprine (FLEXERIL) 10 MG tablet Take 1 tablet (10 mg total) by mouth at bedtime as needed. 03/09/17   Salley Scarlet, MD  diclofenac sodium (VOLTAREN) 1 % GEL Apply 2 g topically 4 (four) times daily. 03/09/17   [place], Velna Hatchet, MD  eletriptan (RELPAX) 40 MG tablet TAKE 1 TAB AT ONSET OF HEADACHE.MAY REPEAT IN 2HRS IF HEADACHE PERSISTS 01/29/17   [place], Velna Hatchet, MD  estradiol (VIVELLE-DOT) 0.05 MG/24HR patch Place 1 patch onto the skin 2 (two) times a week.  10/17/14   [provider]    ibuprofen (ADVIL,MOTRIN) 600 MG tablet Take 1 tablet (600 mg total) by mouth every 6 (six) hours as needed. 10/16/15   Mady Gemma, PA-C  losartan-hydrochlorothiazide (HYZAAR) 50-12.5 MG tablet Take 1 tablet by mouth daily. 01/29/17   Salley Scarlet, MD  Multiple Vitamins-Minerals (MULTIVITAMIN WITH MINERALS) tablet Take 1 tablet by mouth daily.    [provider]  promethazine (PHENERGAN) 12.5 MG tablet Take 1 tablet (12.5 mg total) by mouth every 8 (eight) hours as needed for nausea or vomiting. 01/29/17   [place], Velna Hatchet, MD  propranolol ER (INDERAL LA) 80 MG 24 hr capsule Take 1 capsule (80 mg total) by mouth daily. 03/09/17   Donita Brooks, MD    Family History Family History  Problem Relation Age of Onset  . Cancer Mother        stage III, colon  . Hypertension Mother   . Dementia Mother   . Heart disease Mother        bradycardia  . Cancer Father        prostate  . Hypertension Father   . Hyperlipidemia Father   . Heart disease Father 31       MI   . Hyperlipidemia Sister   . Hypertension Sister   . Fibromyalgia Sister   . Arthritis Brother        RA  . Hypertension Brother   . Hyperlipidemia Brother   . Fibromyalgia Brother     Social History Social History  Substance Use Topics  . Smoking status: Never Smoker  . Smokeless tobacco: Never Used  . Alcohol use No     Allergies   Aspirin; Cymbalta [duloxetine hcl]; and Simvastatin   Review of Systems Review of Systems  Cardiovascular: Positive for chest pain.  Neurological: Positive for weakness.  All other systems reviewed and are negative.    Physical Exam Updated Vital Signs BP 140/86   Pulse (!) 58   Temp 97.8 F (36.6 C) (Oral)   Resp 16   Ht 5\' 6"  (1.676 m)   Wt 71.7 kg (158 lb)   SpO2 100%   BMI 25.50 kg/m   Physical Exam  Constitutional: She is oriented to person, place, and time. She appears well-developed and well-nourished.  HENT:  Head: Normocephalic and  atraumatic.  Mouth/Throat: Oropharynx is clear and moist.  Eyes: Conjunctivae and EOM are normal. Pupils are equal, round, and reactive to light.  Neck: Normal range of motion.  Cardiovascular: Normal rate, regular rhythm and normal heart sounds.   Pulmonary/Chest: Effort normal and breath sounds normal. No respiratory distress. She has no wheezes.  Abdominal: Soft. Bowel sounds are normal. There is no tenderness. There is no rebound.  Musculoskeletal: Normal range of motion.  Neurological: She is alert and oriented to person, place, and time.  Skin: Skin is warm and dry.  Psychiatric: She has a normal mood and affect.  Nursing note and vitals reviewed.    ED Treatments / Results  Labs (all labs ordered are listed, but only abnormal results are displayed) Labs Reviewed  CBC WITH DIFFERENTIAL/PLATELET - Abnormal; Notable for the following:       Result Value   Hemoglobin 11.6 (*)    HCT 35.6 (*)    All other components within normal limits  BASIC METABOLIC PANEL  I-STAT TROPOININ, ED  I-STAT TROPOININ, ED    EKG  EKG Interpretation  Date/Time:  Saturday Apr 07 2017 11:09:00 EDT Ventricular Rate:  64 PR Interval:  178 QRS Duration: 80 QT Interval:  412 QTC Calculation: 425 R Axis:   17 Text Interpretation:  Normal sinus rhythm Normal ECG Confirmed by RAY MD, DANIELLE ([id number]) on 04/07/2017 11:55:46 AM       Radiology Dg Chest 2 View  Result Date: 04/07/2017 CLINICAL DATA:  Weakness, chest pain, hypertension, asthma EXAM: CHEST  2 VIEW COMPARISON:  11/20/2011 FINDINGS: Upper normal heart size. Mediastinal contours and pulmonary vascularity normal. Lungs clear. No pleural effusion or pneumothorax. Bones unremarkable. Increased stool in colon. IMPRESSION: No acute pulmonary abnormalities. Increased stool in colon. Electronically Signed   By: Ulyses Southward M.D.   On: 04/07/2017 12:47    Procedures Procedures (including critical care time)  Medications Ordered in  ED Medications  sodium chloride 0.9 % bolus 1,000 mL (0 mLs Intravenous Stopped 04/07/17 1424)  ondansetron (ZOFRAN) injection 4 mg (4 mg Intravenous Given 04/07/17 1156)     Initial Impression / Assessment and Plan / ED Course  I have reviewed the triage vital signs and the nursing notes.  Pertinent labs & imaging results that were available during my care of the patient were reviewed by me and considered in my medical decision making (see chart for details).  56 year old female here with variable symptoms since yesterday evening while attending an outdoor graduation. Does report she felt overheated at the time and has had some mild left-sided chest pain. PCP is also recently started her on B12 supplementation and a new blood pressure medication. Reports her BPs have been low at home into the 90's systolic which is abnormal for her (states usually 150's). She is afebrile and nontoxic in appearance here. BP is soft but stable. EKG is nonischemic. Labwork is overall reassuring. Chest x-ray is clear. Patient was treated here with IV fluids and Zofran reports feeling much better. Her blood pressure has improved into the 120 systolic. She does have some noted cardiac risk factors, HEART score of 3.  Will obtain delta troponin.  Delta troponin is negative. Patient continues to feel well here. No further chest pain. At this time given negative evaluation here, have low suspicion for ACS, PE, dissection, acute cardiac event. I feel she is stable for discharge. I recommended that she follow-up closely with her primary care doctor about her medications. We'll need to keep a close eye on her blood pressure at home. Return precautions were given for new or worsening symptoms. She acknowledged understanding and agreed with plan of care.  Case discussed with attending physician, Dr. Rosalia Hammers, who agrees with plan of care.  Final Clinical Impressions(s) / ED Diagnoses   Final diagnoses:  Chest pain, unspecified type   Lightheadedness    New Prescriptions New Prescriptions   No medications on file     Garlon Hatchet, PA-C 04/07/17 1529    Margarita Grizzle, MD 04/08/17 [phone]

## 2017-04-07 NOTE — ED Notes (Signed)
Pt verbalized understanding of d/c instructions and has no further questions. Pt stable and NAD. VSS. Pt ambulatory.

## 2017-04-11 ENCOUNTER — Telehealth: Payer: Self-pay | Admitting: Family Medicine

## 2017-04-11 NOTE — Telephone Encounter (Signed)
Pt went to a graduation on Friday night got hot and started having symptoms of light headedness, dizzy and EMT was called out but she refused to go to hospital then as [place], on 04/07/17 [facility] - she stopped taking both BP meds as she was not sure if those meds caused this and now her BP is climbing up again. She is not sure if it was the BP meds or just the fact she got hot?!? Informed her to restart the Hyzaar and would discuss the propranolol with you to see if you want her to restart? She has been taking since 03/09/17 without a problem. Please advise.

## 2017-04-12 NOTE — Telephone Encounter (Signed)
Patient aware of providers recommendations and pt did start back on both BP meds this am and is not having any symptoms as of today. She will keep a check on her BP and if further problems arise she will call us back.

## 2017-04-12 NOTE — Telephone Encounter (Signed)
Do not think it was BP meds.  May have had vasovagal epsiode.  I would resume bp meds unless symptoms return.

## 2017-04-13 ENCOUNTER — Other Ambulatory Visit: Payer: Self-pay | Admitting: Gastroenterology

## 2017-04-13 ENCOUNTER — Ambulatory Visit
Admission: RE | Admit: 2017-04-13 | Discharge: 2017-04-13 | Disposition: A | Discharge status: Home / Self Care | Payer: BC Managed Care – PPO | Source: Ambulatory Visit | Attending: Gastroenterology | Admitting: Gastroenterology

## 2017-04-13 DIAGNOSIS — R1011 Right upper quadrant pain: Secondary | ICD-10-CM

## 2017-04-13 DIAGNOSIS — R110 Nausea: Secondary | ICD-10-CM

## 2017-05-22 ENCOUNTER — Other Ambulatory Visit: Payer: Self-pay | Admitting: Family Medicine

## 2017-06-05 ENCOUNTER — Encounter: Payer: Self-pay | Admitting: Family Medicine

## 2017-06-05 ENCOUNTER — Ambulatory Visit (INDEPENDENT_AMBULATORY_CARE_PROVIDER_SITE_OTHER): Payer: BC Managed Care – PPO | Admitting: Family Medicine

## 2017-06-05 VITALS — BP 130/76 | HR 72 | Temp 97.9°F | Resp 16 | Ht 66.0 in | Wt 172.0 lb

## 2017-06-05 DIAGNOSIS — R21 Rash and other nonspecific skin eruption: Secondary | ICD-10-CM | POA: Diagnosis not present

## 2017-06-05 DIAGNOSIS — M2550 Pain in unspecified joint: Secondary | ICD-10-CM

## 2017-06-05 NOTE — Progress Notes (Signed)
Subjective:    Patient ID: Sherry Daniels, female    DOB: 12/14/1960, 56 y.o.   MRN: [id number]  HPI Patient has been diagnosed with fibromyalgia in the past as a diagnosis of exclusion. However she is starting to have enough symptoms to qualify for possible consideration of lupus. 1) she has a history of serositis. She's been in the emergency room in the past and diagnosed with pleurisy. She believes she may have been diagnosed with pericarditis in the past as well. I have no records to review of this but she does remember having chest wall pain worse with inspiration prompting an emergency room visit 2) she has a history of multiple painful swollen joints in her fingers,  knees ankles. Per her report, at times they are erythematous and swollen with edema. 3) she has a history of elevated liver function test. She's been diagnosed with fatty liver disease without a biopsy. However she is not morbidly obese. She does not consume excessive alcohol. She exercises regularly. She eats a healthy diet. 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 She also has occasional anemias no history of hemolytic anemia. 5) last week she has developed a malodor rash. She has several pictures on her cell phone. The rash is a truly butterfly-shaped erythematous papular rash on both cheeks on her nose and on her nasal bridge. It is tender. She denies any contact dermatitis in that area. She has no history of rosacea. There is no involvement of her chin. She also states that in the past she has developed rashes that worsened with sun exposure.  Previous ANAs have been negative. Past Medical History:  Diagnosis Date  . Allergy   . Asthma   . Chest pain 11/2009   overnight hospitalization  . Constipation    Dr. Loreta AveMann  . Fibromyalgia   . GERD (gastroesophageal reflux disease)   . Hyperlipidemia   . Hypertension   . Migraine   . Nonalcoholic fatty liver disease    Past  Surgical History:  Procedure Laterality Date  . ABDOMINAL HYSTERECTOMY     total  . COLONOSCOPY  09/15/2009   Dr. Loreta AveMann; normal  . WISDOM TOOTH EXTRACTION     Current Outpatient Prescriptions on File Prior to Visit  Medication Sig Dispense Refill  . acetaminophen (TYLENOL) 500 MG tablet Take 1,500 mg by mouth every 6 (six) hours as needed. Reported on 05/01/2016    . ALPRAZolam (XANAX) 0.5 MG tablet TAKE 1 TABLET BY MOUTH AT BEDTIME AS NEEDED FOR ANXIETY 30 tablet 1  . aspirin 81 MG EC tablet 1 tablet po qOD 30 tablet 12  . cyclobenzaprine (FLEXERIL) 10 MG tablet Take 1 tablet (10 mg total) by mouth at bedtime as needed. 30 tablet 0  . diclofenac sodium (VOLTAREN) 1 % GEL Apply 2 g topically 4 (four) times daily. 100 g 3  . eletriptan (RELPAX) 40 MG tablet TAKE 1 TAB AT ONSET OF HEADACHE.MAY REPEAT IN 2HRS IF HEADACHE PERSISTS 10 tablet 2  . estradiol (VIVELLE-DOT) 0.05 MG/24HR patch Place 1 patch onto the skin 2 (two) times a week.   9  . ibuprofen (ADVIL,MOTRIN) 600 MG tablet Take 1 tablet (600 mg total) by mouth every 6 (six) hours as needed. 30 tablet 0  . losartan-hydrochlorothiazide (HYZAAR) 50-12.5 MG tablet TAKE 1 TABLET BY MOUTH DAILY. 30 tablet 2  . Multiple Vitamins-Minerals (MULTIVITAMIN WITH MINERALS) tablet Take 1 tablet by mouth daily.    .Marland Kitchen  promethazine (PHENERGAN) 12.5 MG tablet Take 1 tablet (12.5 mg total) by mouth every 8 (eight) hours as needed for nausea or vomiting. 20 tablet 0  . propranolol ER (INDERAL LA) 80 MG 24 hr capsule Take 1 capsule (80 mg total) by mouth daily. 30 capsule 5   No current facility-administered medications on file prior to visit.    Allergies  Allergen Reactions  . Aspirin Nausea Only  . Cymbalta [Duloxetine Hcl]     Felt like it worsened anxiety and fibromyalgia  . Simvastatin Other (See Comments)    dizziness   Social History   Social History  . Marital status: Married    Spouse name: N/A  . Number of children: N/A  . Years of  education: N/A   Occupational History  . supervise principles in school system [employer]   Social History Main Topics  . Smoking status: Never Smoker  . Smokeless tobacco: Never Used  . Alcohol use No  . Drug use: No  . Sexual activity: Yes    Birth control/ protection: Surgical   Other Topics Concern  . Not on file   Social History Narrative   Married, 1 daughter and 1 son, pentecostal, exercise most days, walking, biking, running.    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      Objective:   Physical Exam  Constitutional: She appears well-developed and well-nourished.  HENT:  Head:    Mouth/Throat: Oropharynx is clear and moist.  Cardiovascular: Normal rate, regular rhythm and normal heart sounds.   Pulmonary/Chest: Effort normal and breath sounds normal. No respiratory distress. She has no wheezes. She has no rales.  Abdominal: Soft. Bowel sounds are normal. She exhibits no distension. There is no tenderness. There is no rebound and no guarding.  Musculoskeletal:       Right hip: Normal.       Left hip: Normal.       Right knee: Normal.       Left knee: Normal.       Right hand: Normal.       Left hand: Normal.  Skin: Rash noted. There is erythema.  Vitals reviewed.         Assessment & Plan:  Malar rash - Plan: ANA, Anti-DNA antibody, double-stranded, Ambulatory referral to Rheumatology  Polyarthralgia - Plan: Ambulatory referral to Rheumatology  I will check an ANA today given the fact she has an active rash. I will also check antibodies against double-stranded DNA. Meanwhile I'll consult rheumatology for second opinion. However she is starting to meet enough criteria to consider SLE.  She does not have a history of proteinuria or cellular casts in her urine. She has no history of seizures or psychosis. Therefore I would like a second opinion as the diagnosis is not clear

## 2017-06-06 LAB — ANA: ANA: NEGATIVE

## 2017-06-07 ENCOUNTER — Encounter: Payer: Self-pay | Admitting: Family Medicine

## 2017-06-07 LAB — ANTI-DNA ANTIBODY, DOUBLE-STRANDED: ds DNA Ab: 1 IU/mL

## 2017-06-18 ENCOUNTER — Other Ambulatory Visit: Payer: Self-pay | Admitting: Gastroenterology

## 2017-06-18 DIAGNOSIS — R1011 Right upper quadrant pain: Secondary | ICD-10-CM

## 2017-06-18 DIAGNOSIS — R112 Nausea with vomiting, unspecified: Secondary | ICD-10-CM

## 2017-06-18 NOTE — Progress Notes (Signed)
Sherry Joubert MD 

## 2017-06-21 MED ORDER — DOXYCYCLINE HYCLATE 100 MG PO TABS: 100.0000 mg | ORAL_TABLET | Freq: Two times a day (BID) | ORAL | 0 refills | Status: DC

## 2017-06-27 ENCOUNTER — Ambulatory Visit (HOSPITAL_COMMUNITY)
Admission: RE | Admit: 2017-06-27 | Discharge: 2017-06-27 | Disposition: A | Discharge status: Home / Self Care | Payer: BC Managed Care – PPO | Source: Ambulatory Visit | Attending: Gastroenterology | Admitting: Gastroenterology

## 2017-06-27 DIAGNOSIS — R1011 Right upper quadrant pain: Secondary | ICD-10-CM | POA: Diagnosis not present

## 2017-06-27 DIAGNOSIS — R110 Nausea: Secondary | ICD-10-CM | POA: Insufficient documentation

## 2017-06-27 DIAGNOSIS — R112 Nausea with vomiting, unspecified: Secondary | ICD-10-CM

## 2017-06-27 MED ORDER — TECHNETIUM TC 99M MEBROFENIN IV KIT
5.6000 | PACK | Freq: Once | INTRAVENOUS | Status: AC | PRN
  Administered 2017-06-27: 5.6 via INTRAVENOUS

## 2017-09-29 ENCOUNTER — Other Ambulatory Visit: Payer: Self-pay | Admitting: Family Medicine

## 2017-10-26 ENCOUNTER — Emergency Department (HOSPITAL_COMMUNITY): Payer: BC Managed Care – PPO

## 2017-10-26 ENCOUNTER — Emergency Department (HOSPITAL_COMMUNITY)
Admission: EM | Admit: 2017-10-26 | Discharge: 2017-10-26 | Disposition: A | Discharge status: Home / Self Care | Payer: BC Managed Care – PPO | Attending: Emergency Medicine | Admitting: Emergency Medicine

## 2017-10-26 ENCOUNTER — Encounter (HOSPITAL_COMMUNITY): Payer: Self-pay | Admitting: Emergency Medicine

## 2017-10-26 DIAGNOSIS — Z79899 Other long term (current) drug therapy: Secondary | ICD-10-CM | POA: Insufficient documentation

## 2017-10-26 DIAGNOSIS — I10 Essential (primary) hypertension: Secondary | ICD-10-CM | POA: Diagnosis not present

## 2017-10-26 DIAGNOSIS — J45909 Unspecified asthma, uncomplicated: Secondary | ICD-10-CM | POA: Insufficient documentation

## 2017-10-26 DIAGNOSIS — R001 Bradycardia, unspecified: Secondary | ICD-10-CM | POA: Insufficient documentation

## 2017-10-26 DIAGNOSIS — Z7982 Long term (current) use of aspirin: Secondary | ICD-10-CM | POA: Insufficient documentation

## 2017-10-26 DIAGNOSIS — R079 Chest pain, unspecified: Secondary | ICD-10-CM | POA: Diagnosis present

## 2017-10-26 LAB — I-STAT BETA HCG BLOOD, ED (MC, WL, AP ONLY): I-stat hCG, quantitative: <5 m[IU]/mL (ref <5)

## 2017-10-26 LAB — I-STAT TROPONIN, ED: TROPONIN I, POC: 0.03 ng/mL (ref 0.00–0.08)

## 2017-10-26 LAB — CBC
HCT: 37.2 % (ref 36.0–46.0)
HEMOGLOBIN: 12.2 g/dL (ref 12.0–15.0)
MCH: 29.4 pg (ref 26.0–34.0)
MCHC: 32.8 g/dL (ref 30.0–36.0)
MCV: 89.6 fL (ref 78.0–100.0)
Platelets: 255 10*3/uL (ref 150–400)
RBC: 4.15 MIL/uL (ref 3.87–5.11)
RDW: 13.4 % (ref 11.5–15.5)
WBC: 5.6 10*3/uL (ref 4.0–10.5)

## 2017-10-26 LAB — BASIC METABOLIC PANEL
ANION GAP: 8 (ref 5–15)
BUN: 13 mg/dL (ref 6–20)
CO2: 27 mmol/L (ref 22–32)
CREATININE: 0.77 mg/dL (ref 0.44–1.00)
Calcium: 9.3 mg/dL (ref 8.9–10.3)
Chloride: 102 mmol/L (ref 101–111)
GFR calc Af Amer: >60 mL/min (ref >60)
GFR calc non Af Amer: >60 mL/min (ref >60)
GLUCOSE: 121 mg/dL — AB (ref 65–99)
Potassium: 3.8 mmol/L (ref 3.5–5.1)
Sodium: 137 mmol/L (ref 135–145)

## 2017-10-26 NOTE — ED Notes (Signed)
Pt ambulated independently without assistance denies SOB or dizziness

## 2017-10-26 NOTE — ED Provider Notes (Signed)
[facility] EMERGENCY DEPARTMENT Provider Note   CSN: [id number] Arrival date & time: 10/26/17  1811     History   Chief Complaint Chief Complaint  Patient presents with  . Chest Pain    HPI Sherry Daniels is a 56 y.o. female.  Patient with a history of HTN, migraines, HLD, GERD presents with episode of chest pressure, diaphoresis, nausea that started around 5:00 pm today while at rest and lasted about one hour. No vomiting or SOB. She reports symptoms occurred approximately 2 hours after taking her propranolol. She reports having forgotten to take her dose yesterday morning and took in the evening, last night. She took her dose today at the regular time and symptoms started about 2 hours later. She states at the time she was having chest pain her heart rate had dropped to the 40's.    The history is provided by the patient. No language interpreter was used.    Past Medical History:  Diagnosis Date  . Allergy   . Asthma   . Chest pain 11/2009   overnight hospitalization  . Constipation    Dr. Loreta Ave  . Fibromyalgia   . GERD (gastroesophageal reflux disease)   . Hyperlipidemia   . Hypertension   . Migraine   . Nonalcoholic fatty liver disease     Patient Active Problem List   Diagnosis Date Noted  . Migraines 01/29/2017  . Anemia 11/24/2011  . Headache(784.0) 11/24/2011  . Nonalcoholic fatty liver disease 11/08/2011  . Essential hypertension, benign 10/12/2011  . Hyperlipidemia 10/12/2011  . GERD (gastroesophageal reflux disease) 10/12/2011  . Fibromyalgia 10/12/2011  . Polyarthralgia 10/12/2011  . Allergy     Past Surgical History:  Procedure Laterality Date  . ABDOMINAL HYSTERECTOMY     total  . COLONOSCOPY  09/15/2009   Dr. Loreta Ave; normal  . WISDOM TOOTH EXTRACTION      OB History    No data available       Home Medications    Prior to Admission medications   Medication Sig Start Date End Date Taking? Authorizing Provider    acetaminophen (TYLENOL) 500 MG tablet Take 1,500 mg by mouth every 6 (six) hours as needed. Reported on 05/01/2016    [provider]  ALPRAZolam Prudy Feeler) 0.5 MG tablet TAKE 1 TABLET BY MOUTH AT BEDTIME AS NEEDED FOR ANXIETY 01/29/17   Salley Scarlet, MD  aspirin 81 MG EC tablet 1 tablet po qOD 07/05/12   Tysinger, Kermit Balo, PA-C  cyclobenzaprine (FLEXERIL) 10 MG tablet Take 1 tablet (10 mg total) by mouth at bedtime as needed. 03/09/17   Salley Scarlet, MD  diclofenac sodium (VOLTAREN) 1 % GEL Apply 2 g topically 4 (four) times daily. 03/09/17   Salley Scarlet, MD  doxycycline (VIBRA-TABS) 100 MG tablet Take 1 tablet (100 mg total) by mouth 2 (two) times daily. 06/21/17   Donita Brooks, MD  eletriptan (RELPAX) 40 MG tablet TAKE 1 TAB AT ONSET OF HEADACHE.MAY REPEAT IN 2HRS IF HEADACHE PERSISTS 01/29/17   Braxton, Velna Hatchet, MD  estradiol (VIVELLE-DOT) 0.05 MG/24HR patch Place 1 patch onto the skin 2 (two) times a week.  10/17/14   [provider]  ibuprofen (ADVIL,MOTRIN) 600 MG tablet Take 1 tablet (600 mg total) by mouth every 6 (six) hours as needed. 10/16/15   Mady Gemma, PA-C  losartan-hydrochlorothiazide (HYZAAR) 50-12.5 MG tablet TAKE 1 TABLET BY MOUTH DAILY. 10/01/17   Donita Brooks, MD  Multiple Vitamins-Minerals (  MULTIVITAMIN WITH MINERALS) tablet Take 1 tablet by mouth daily.    [provider]  promethazine (PHENERGAN) 12.5 MG tablet Take 1 tablet (12.5 mg total) by mouth every 8 (eight) hours as needed for nausea or vomiting. 01/29/17   Corydon, Velna HatchetKawanta F, MD  propranolol ER (INDERAL LA) 80 MG 24 hr capsule Take 1 capsule (80 mg total) by mouth daily. 03/09/17   Donita BrooksPickard, Warren T, MD    Family History Family History  Problem Relation Age of Onset  . Cancer Mother        stage III, colon  . Hypertension Mother   . Dementia Mother   . Heart disease Mother        bradycardia  . Cancer Father        prostate  . Hypertension Father   .  Hyperlipidemia Father   . Heart disease Father [id number]       MI   . Hyperlipidemia Sister   . Hypertension Sister   . Fibromyalgia Sister   . Arthritis Brother        RA  . Hypertension Brother   . Hyperlipidemia Brother   . Fibromyalgia Brother     Social History Social History   Tobacco Use  . Smoking status: Never Smoker  . Smokeless tobacco: Never Used  Substance Use Topics  . Alcohol use: No    Alcohol/week: 0.0 oz  . Drug use: No     Allergies   Aspirin; Cymbalta [duloxetine hcl]; and Simvastatin   Review of Systems Review of Systems  Constitutional: Positive for diaphoresis. Negative for chills and fever.  HENT: Negative.   Respiratory: Negative.   Cardiovascular: Positive for chest pain.  Gastrointestinal: Positive for nausea. Negative for abdominal pain and vomiting.  Musculoskeletal: Negative.   Skin: Negative.   Neurological: Positive for dizziness.     Physical Exam Updated Vital Signs BP (!) 147/80   Pulse 60   Temp 98.3 F (36.8 C) (Oral)   Resp 17   Ht 5\' 6"  (1.676 m)   Wt 63.5 kg (140 lb)   SpO2 100%   BMI 22.60 kg/m   Physical Exam  Constitutional: She is oriented to person, place, and time. She appears well-developed and well-nourished.  HENT:  Head: Normocephalic.  Neck: Normal range of motion. Neck supple.  Cardiovascular: Normal rate and regular rhythm.  Pulmonary/Chest: Effort normal and breath sounds normal. She has no wheezes. She has no rales.  Abdominal: Soft. Bowel sounds are normal. There is no tenderness. There is no rebound and no guarding.  Musculoskeletal: Normal range of motion.       Right lower leg: She exhibits no edema.       Left lower leg: She exhibits no edema.  Neurological: She is alert and oriented to person, place, and time.  Skin: Skin is warm and dry. No rash noted.  Psychiatric: She has a normal mood and affect.     ED Treatments / Results  Labs (all labs ordered are listed, but only abnormal results  are displayed) Labs Reviewed  BASIC METABOLIC PANEL - Abnormal; Notable for the following components:      Result Value   Glucose, Bld 121 (*)    All other components within normal limits  CBC  I-STAT TROPONIN, ED  I-STAT BETA HCG BLOOD, ED ([facility], [facility], [facility] ONLY)    EKG  EKG Interpretation  Date/Time:  Friday October 26 2017 18:26:31 EST Ventricular Rate:  58 PR Interval:    QRS  Duration: 88 QT Interval:  438 QTC Calculation: 431 R Axis:   28 Text Interpretation:  Sinus rhythm since last tracing no significant change Confirmed by Mancel BaleWentz, Elliott [phone]([id number]) on 10/26/2017 7:06:02 PM       Radiology Dg Chest 2 View  Result Date: 10/26/2017 CLINICAL DATA:  Chest pain EXAM: CHEST  2 VIEW COMPARISON:  04/07/2017 FINDINGS: Heart and mediastinal contours are within normal limits. No focal opacities or effusions. No acute bony abnormality. IMPRESSION: No active cardiopulmonary disease. Electronically Signed   By: Charlett NoseKevin  Dover M.D.   On: 10/26/2017 18:59    Procedures Procedures (including critical care time)  Medications Ordered in ED Medications - No data to display   Initial Impression / Assessment and Plan / ED Course  I have reviewed the triage vital signs and the nursing notes.  Pertinent labs & imaging results that were available during my care of the patient were reviewed by me and considered in my medical decision making (see chart for details).     Patient presents for episode of chest pressure, diaphoresis, nausea, dizziness that lasted about one hour. She is currently asymptomatic. Labs unremarkable, EKG NSR, CXR clear. VSS.  It is reasonable that the symptoms she had tonight as related to close propranolol dosing.   The patient is ambulated in the ED and allowed to rest and had no further symptoms. Discussed with Dr. Effie ShyWentz. She is felt stable for discharge home.   Final Clinical Impressions(s) / ED Diagnoses   Final diagnoses:  None   1. Symptomatic bradycardia,  resolved  ED Discharge Orders    None       Danne HarborUpstill, Audray Rumore, PA-C 10/26/17 2116    Mancel BaleWentz, Elliott, MD 10/27/17 1026

## 2017-10-26 NOTE — Discharge Instructions (Signed)
Continue your Propranolol as directed, avoiding taking more than one in 24 hours. To back down to your usual morning dose schedule, take each dose no more than 2 hours earlier until you are back on a regular schedule.

## 2017-10-26 NOTE — ED Triage Notes (Signed)
Per EMS pt started having CP central pressure at 1700. Pt was diaphoretic and N. Pt took propanolol  this morning and last night 45-50 HR. Pain 4/10, 324 mg aspirin given

## 2017-11-19 ENCOUNTER — Other Ambulatory Visit: Payer: Self-pay | Admitting: Family Medicine

## 2017-11-30 NOTE — Progress Notes (Signed)
Office Visit Note  Patient: Sherry Daniels             Date of Birth: 04/09/1961           MRN: [id number]             PCP: Donita BrooksPickard, Warren T, MD Referring: Donita BrooksPickard, Warren T, MD Visit Date: 12/06/2017 Occupation: Museum/gallery curatorchool administrator    Subjective:  Joint pain, rash and fatigue  History of Present Illness: Sherry Daniels is a 57 y.o. female  seen in consultation per request of her PCP. According to patient her symptoms started about 57 years old with increased fatigue muscle and joint pain. She recalls that she was in so much discomfort that even the touch of the bed sheets caused pain. She started having symptoms of migraines and IBS the same time. She states the IBS symptoms have been quite prominent with diarrhea and constipation. She's been followed by Dr. Loreta AveMann. The pain has gradually gotten worse over time. She describes hyperalgesia. She's also had some hair loss in the last few years. She states in July 2018 she developed a rash on her face and her neck. At the time she was seen by Dr. Broadus [facility]Warren dermatologist at [facility] who suspected malar rash and did lab for for autoimmune disease which was negative. He did a biopsy from the nape of her neck which came positive for eczema per patient. I do not have those records to confirm. She states she still have some residual rash on her face. She states when the GI symptoms flare the rash flares. She's been also experiencing increased migraines.  Activities of Daily Living:  Patient reports morning stiffness for 10-15 minutes.   Patient Denies nocturnal pain.  Difficulty dressing/grooming: Denies Difficulty climbing stairs: Reports Difficulty getting out of chair: Reports Difficulty using hands for taps, buttons, cutlery, and/or writing: Denies   Review of Systems  Constitutional: Positive for fatigue. Negative for weakness.  HENT: Positive for mouth sores and mouth dryness. Negative for nose dryness.   Eyes: Positive for dryness.  Negative for redness and visual disturbance.  Respiratory: Negative for cough, hemoptysis, shortness of breath, wheezing and difficulty breathing.   Cardiovascular: Positive for irregular heartbeat (Bradycardia). Negative for chest pain, palpitations, hypertension and swelling in legs/feet.  Gastrointestinal: Positive for constipation (IBS) and diarrhea. Negative for blood in stool.  Endocrine: Negative for increased urination.  Genitourinary: Negative for painful urination.  Musculoskeletal: Positive for arthralgias, joint pain, joint swelling, myalgias, morning stiffness and myalgias. Negative for muscle weakness and muscle tenderness.  Skin: Positive for rash. Negative for color change, pallor, hair loss, nodules/bumps, redness, skin tightness, ulcers and sensitivity to sunlight.  Neurological: Positive for headaches (Migraines). Negative for dizziness and numbness.  Hematological: Negative for swollen glands.  Psychiatric/Behavioral: Positive for sleep disturbance. Negative for depressed mood. The patient is nervous/anxious.     PMFS History:  Patient Active Problem List   Diagnosis Date Noted  . Migraines 01/29/2017  . Anemia 11/24/2011  . Headache(784.0) 11/24/2011  . Nonalcoholic fatty liver disease 11/08/2011  . Essential hypertension, benign 10/12/2011  . Hyperlipidemia 10/12/2011  . GERD (gastroesophageal reflux disease) 10/12/2011  . Fibromyalgia 10/12/2011  . Polyarthralgia 10/12/2011  . Allergy     Past Medical History:  Diagnosis Date  . Allergy   . Asthma   . Chest pain 11/2009   overnight hospitalization  . Constipation    Dr. Loreta AveMann  . Fibromyalgia   . GERD (gastroesophageal reflux disease)   .  Hyperlipidemia   . Hypertension   . Migraine   . Nonalcoholic fatty liver disease     Family History  Problem Relation Age of Onset  . Cancer Mother        stage III, colon  . Hypertension Mother   . Dementia Mother   . Heart disease Mother        bradycardia  .  Cancer Father        prostate  . Hypertension Father   . Hyperlipidemia Father   . Heart disease Father 49       MI   . Hyperlipidemia Sister   . Hypertension Sister   . Fibromyalgia Sister   . Arthritis Brother        RA  . Hypertension Brother   . Hyperlipidemia Brother   . Fibromyalgia Brother   . Hypertension Brother   . Hyperlipidemia Brother    Past Surgical History:  Procedure Laterality Date  . ABDOMINAL HYSTERECTOMY     total  . COLONOSCOPY  09/15/2009   Dr. Loreta Ave; normal  . WISDOM TOOTH EXTRACTION     Social History   Social History Narrative   Married, 1 daughter and 1 son, pentecostal, exercise most days, walking, biking, running.    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 She appears well-developed and well-nourished.  HENT:  Head: Normocephalic and atraumatic.  Eyes: Conjunctivae and EOM are normal.  Neck: Normal range of motion.  Cardiovascular: Normal rate, regular rhythm, normal heart sounds and intact distal pulses.  Pulmonary/Chest: Effort normal and breath sounds normal.  Abdominal: Soft. Bowel sounds are normal.  Lymphadenopathy:    She has no cervical adenopathy.  Neurological: She is alert and oriented to person, place, and time.  Skin: Skin is warm and dry. Capillary refill takes less than 2 seconds.  Psychiatric: She has a normal mood and affect. Her behavior is normal.  Nursing note and vitals reviewed.    Musculoskeletal Exam: C-spine and thoracic lumbar spine good range of motion. Shoulder joints elbow joints wrist joint MCPs PIPs DIPs with good range of motion with no synovitis. Hip joints knee joints ankles MTPs PIPs DIPs with good range of motion with no synovitis.  She has discomfort on palpation of her PIP joints of her hands and feet. She also has discomfort range of motion of her knee joints with some crepitus.  CDAI Exam: No CDAI exam completed.    Investigation: No additional findings.  Component     Latest Ref Rng & Units 01/29/2017  Sed Rate     0 - 30 mm/hr 12  CRP     <8.0 mg/L 1.3  Anit Nuclear Antibody(ANA)     NEGATIVE NEG  RA Latex Turbid.     <14 IU/mL <14   Component     Latest Ref Rng & Units 06/05/2017  Anit Nuclear Antibody(ANA)     NEGATIVE NEG  ds DNA Ab     IU/mL 1   CBC Latest Ref Rng & Units 10/26/2017 04/07/2017 03/29/2017  WBC 4.0 - 10.5 K/uL 5.6 6.5 3.8  Hemoglobin 12.0 - 15.0 g/dL 16.1 11.6(L) 11.2(L)  Hematocrit  36.0 - 46.0 % 37.2 35.6(L) 34.5(L)  Platelets 150 - 400 K/uL 255 235 214   CMP Latest Ref Rng & Units 10/26/2017 04/07/2017 03/29/2017  Glucose 65 - 99 mg/dL 161(W) 95 89  BUN 6 - 20 mg/dL 13 10 13   Creatinine 0.44 - 1.00 mg/dL 9.60 4.54 0.98  Sodium 135 - 145 mmol/L 137 137 143  Potassium 3.5 - 5.1 mmol/L 3.8 3.9 3.6  Chloride 101 - 111 mmol/L 102 103 106  CO2 22 - 32 mmol/L 27 28 27   Calcium 8.9 - 10.3 mg/dL 9.3 9.5 8.9  Total Protein 6.1 - 8.1 g/dL - - 6.3  Total Bilirubin 0.2 - 1.2 mg/dL - - 0.3  Alkaline Phos 33 - 130 U/L - - 66  AST 10 - 35 U/L - - 33  ALT 6 - 29 U/L - - 56(H)    Imaging: Xr Hip Unilat W Or W/o Pelvis 2-3 Views Left  Result Date: 12/06/2017 No SI joint narrowing or hip joint narrowing was noted. Impression: Unremarkable x-ray of the hip joint  Xr Hip Unilat W Or W/o Pelvis 2-3 Views Right  Result Date: 12/06/2017 No SI joint narrowing or hip joint narrowing was noted. Impression: Unremarkable x-ray of the hip joint  Xr Hand 2 View Left  Result Date: 12/06/2017 No MCP, PIP, DIP, intercarpal and radiocarpal joint space narrowing was noted. No erosive changes were noted. Impression: Normal x-ray of the left hand.  Xr Hand 2 View Right  Result Date: 12/06/2017 No  MCP, PIP, DIP, intercarpal and radiocarpal joint space narrowing was noted. No erosive changes were noted. Impression: Normal x-ray of the right hand.  Xr Knee 3 View Left  Result Date: 12/06/2017 No significant joint space narrowing was noted. Exophyte noted over the medial aspect of the tibia. Moderate patellofemoral narrowing was noted. 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  Xr Knee 3 View Right  Result Date: 12/06/2017 The joint space narrowing was noted. Moderate patellofemoral narrowing was noted. Impression: Moderate chondromalacia patella of the knee.   Speciality Comments: No specialty comments available.    Procedures:  No procedures performed Allergies: Aspirin; Codeine; Cymbalta [duloxetine hcl]; and Simvastatin   Assessment / Plan:     Visit Diagnoses: Polyarthralgia - patient gives history of polyarthralgia for multiple years. She also gives history of intermittent joint swelling. I do not see any synovitis today. We discussed that in future when she has a flare she will get following labs. Plan: ANA, Cyclic citrul peptide antibody, IgG, 14-3-3 eta Protein, Sedimentation rate  Rash - Seen by Dr. Broadus John. Skin biopsy was consistent with eczema per patient.06/05/17: ANA negative, dsDNA negative  - I only noticed mild hyperpigmentation on her forehead and underneath her eyes. This is an active rash present. She will do following labs in future as well. Plan: Urinalysis, Routine w reflex microscopic, Anti-scleroderma antibody, RNP Antibody, Anti-Smith antibody, Sjogrens syndrome-A extractable nuclear antibody, Sjogrens syndrome-B extractable nuclear antibody, Anti-DNA antibody, double-stranded, C3 and C4  Pain in both hands. No synovitis or thickening in her joints was noted. She gives history of intermittent pain and swelling in her hands. - Plan: XR Hand 2 View Right, XR Hand 2 View Left. The x-rays were unremarkable.  Chronic pain of both knees.  She has ongoing pain and discomfort in her bilateral knee joints. No warmth swelling or effusion was noted. - Plan: XR KNEE 3 VIEW RIGHT, XR KNEE 3 VIEW LEFT. The x-ray showed chondromalacia patella and an exophyte was  noted over the medial aspect of her left tibia.  Chronic pain in both hips: X-ray of bilateral hip joints 2 views were obtained today.. The x-ray of bilateral hip joints were unremarkable.  Other fatigue - Plan: CBC with Differential/Platelet, COMPLETE METABOLIC PANEL WITH GFR, CK, VITAMIN D 25 Hydroxy (Vit-D Deficiency, Fractures)   Fibromyalgia: She has generalized pain hyperalgesia and positive tender points which goes along with fibromyalgia.  Hx of migraines: She's been taking medications for migraines. She states she's been having more frequent migraines recently. She also describes her job is quite stressful.  Essential hypertension, benign: She is on propranolol. She gives episodes of bradycardia which probably are related to propranolol. Although propranolol should be helpful in controlling her migraines as well.  History of gastroesophageal reflux (GERD)  History of hyperlipidemia  History of pleurisy - 6 years ago. No treatment was needed.  History of anemia  Nonalcoholic fatty liver disease     Orders: Orders Placed This Encounter  Procedures  . XR Hand 2 View Right  . XR Hand 2 View Left  . XR KNEE 3 VIEW RIGHT  . XR KNEE 3 VIEW LEFT  . XR HIP UNILAT W OR W/O PELVIS 2-3 VIEWS RIGHT  . XR HIP UNILAT W OR W/O PELVIS 2-3 VIEWS LEFT  . CBC with Differential/Platelet  . COMPLETE METABOLIC PANEL WITH GFR  . Urinalysis, Routine w reflex microscopic  . CK  . ANA  . Anti-scleroderma antibody  . RNP Antibody  . Anti-Smith antibody  . Sjogrens syndrome-A extractable nuclear antibody  . Sjogrens syndrome-B extractable nuclear antibody  . Anti-DNA antibody, double-stranded  . C3 and C4  . Cyclic citrul peptide antibody, IgG  . 14-3-3 eta Protein  .  Sedimentation rate  . VITAMIN D 25 Hydroxy (Vit-D Deficiency, Fractures)   No orders of the defined types were placed in this encounter.   Face-to-face time spent with patient was 50 minutes. Greater than 50% of time was spent in counseling and coordination of care.  Follow-Up Instructions: Return if symptoms worsen or fail to improve, for Polyarthralgia, rash.   Pollyann Savoy, MD  Note - This record has been created using Animal nutritionist.  Chart creation errors have been sought, but may not always  have been located. Such creation errors do not reflect on  the standard of medical care.

## 2017-12-06 ENCOUNTER — Ambulatory Visit (INDEPENDENT_AMBULATORY_CARE_PROVIDER_SITE_OTHER): Payer: Self-pay

## 2017-12-06 ENCOUNTER — Encounter: Payer: Self-pay | Admitting: Rheumatology

## 2017-12-06 ENCOUNTER — Telehealth: Payer: Self-pay

## 2017-12-06 ENCOUNTER — Ambulatory Visit: Payer: BC Managed Care – PPO | Admitting: Rheumatology

## 2017-12-06 VITALS — BP 125/76 | HR 61 | Resp 16 | Ht 66.0 in | Wt 171.0 lb

## 2017-12-06 DIAGNOSIS — M2550 Pain in unspecified joint: Secondary | ICD-10-CM | POA: Diagnosis not present

## 2017-12-06 DIAGNOSIS — M79641 Pain in right hand: Secondary | ICD-10-CM

## 2017-12-06 DIAGNOSIS — Z8709 Personal history of other diseases of the respiratory system: Secondary | ICD-10-CM | POA: Diagnosis not present

## 2017-12-06 DIAGNOSIS — Z8669 Personal history of other diseases of the nervous system and sense organs: Secondary | ICD-10-CM | POA: Diagnosis not present

## 2017-12-06 DIAGNOSIS — I10 Essential (primary) hypertension: Secondary | ICD-10-CM

## 2017-12-06 DIAGNOSIS — M25551 Pain in right hip: Secondary | ICD-10-CM

## 2017-12-06 DIAGNOSIS — M79642 Pain in left hand: Secondary | ICD-10-CM

## 2017-12-06 DIAGNOSIS — Z8719 Personal history of other diseases of the digestive system: Secondary | ICD-10-CM

## 2017-12-06 DIAGNOSIS — M25562 Pain in left knee: Secondary | ICD-10-CM | POA: Diagnosis not present

## 2017-12-06 DIAGNOSIS — R21 Rash and other nonspecific skin eruption: Secondary | ICD-10-CM | POA: Diagnosis not present

## 2017-12-06 DIAGNOSIS — M25552 Pain in left hip: Secondary | ICD-10-CM

## 2017-12-06 DIAGNOSIS — M797 Fibromyalgia: Secondary | ICD-10-CM | POA: Diagnosis not present

## 2017-12-06 DIAGNOSIS — R5383 Other fatigue: Secondary | ICD-10-CM

## 2017-12-06 DIAGNOSIS — Z8639 Personal history of other endocrine, nutritional and metabolic disease: Secondary | ICD-10-CM

## 2017-12-06 DIAGNOSIS — Z862 Personal history of diseases of the blood and blood-forming organs and certain disorders involving the immune mechanism: Secondary | ICD-10-CM

## 2017-12-06 DIAGNOSIS — G8929 Other chronic pain: Secondary | ICD-10-CM

## 2017-12-06 DIAGNOSIS — K760 Fatty (change of) liver, not elsewhere classified: Secondary | ICD-10-CM

## 2017-12-06 DIAGNOSIS — M25561 Pain in right knee: Secondary | ICD-10-CM

## 2017-12-06 NOTE — Telephone Encounter (Signed)
Called patient to advise patient of xray results. Patient's mobile number was busy and no answer/voicemail available on home phone number.

## 2017-12-06 NOTE — Patient Instructions (Signed)
Knee Exercises Ask your health care provider which exercises are safe for you. Do exercises exactly as told by your health care provider and adjust them as directed. It is normal to feel mild stretching, pulling, tightness, or discomfort as you do these exercises, but you should stop right away if you feel sudden pain or your pain gets worse.Do not begin these exercises until told by your health care provider. STRETCHING AND RANGE OF MOTION EXERCISES These exercises warm up your muscles and joints and improve the movement and flexibility of your knee. These exercises also help to relieve pain, numbness, and tingling. Exercise A: Knee Extension, Prone 1. Lie on your abdomen on a bed. 2. Place your left / right knee just beyond the edge of the surface so your knee is not on the bed. You can put a towel under your left / right thigh just above your knee for comfort. 3. Relax your leg muscles and allow gravity to straighten your knee. You should feel a stretch behind your left / right knee. 4. Hold this position for __________ seconds. 5. Scoot up so your knee is supported between repetitions. Repeat __________ times. Complete this stretch __________ times a day. Exercise B: Knee Flexion, Active  1. Lie on your back with both knees straight. If this causes back discomfort, bend your left / right knee so your foot is flat on the floor. 2. Slowly slide your left / right heel back toward your buttocks until you feel a gentle stretch in the front of your knee or thigh. 3. Hold this position for __________ seconds. 4. Slowly slide your left / right heel back to the starting position. Repeat __________ times. Complete this exercise __________ times a day. Exercise C: Quadriceps, Prone  1. Lie on your abdomen on a firm surface, such as a bed or padded floor. 2. Bend your left / right knee and hold your ankle. If you cannot reach your ankle or pant leg, loop a belt around your foot and grab the belt  instead. 3. Gently pull your heel toward your buttocks. Your knee should not slide out to the side. You should feel a stretch in the front of your thigh and knee. 4. Hold this position for __________ seconds. Repeat __________ times. Complete this stretch __________ times a day. Exercise D: Hamstring, Supine 1. Lie on your back. 2. Loop a belt or towel over the ball of your left / right foot. The ball of your foot is on the walking surface, right under your toes. 3. Straighten your left / right knee and slowly pull on the belt to raise your leg until you feel a gentle stretch behind your knee. ? Do not let your left / right knee bend while you do this. ? Keep your other leg flat on the floor. 4. Hold this position for __________ seconds. Repeat __________ times. Complete this stretch __________ times a day. STRENGTHENING EXERCISES These exercises build strength and endurance in your knee. Endurance is the ability to use your muscles for a long time, even after they get tired. Exercise E: Quadriceps, Isometric  1. Lie on your back with your left / right leg extended and your other knee bent. Put a rolled towel or small pillow under your knee if told by your health care provider. 2. Slowly tense the muscles in the front of your left / right thigh. You should see your kneecap slide up toward your hip or see increased dimpling just above the knee. This   motion will push the back of the knee toward the floor. 3. For __________ seconds, keep the muscle as tight as you can without increasing your pain. 4. Relax the muscles slowly and completely. Repeat __________ times. Complete this exercise __________ times a day. Exercise F: Straight Leg Raises - Quadriceps 1. Lie on your back with your left / right leg extended and your other knee bent. 2. Tense the muscles in the front of your left / right thigh. You should see your kneecap slide up or see increased dimpling just above the knee. Your thigh may  even shake a bit. 3. Keep these muscles tight as you raise your leg 4-6 inches (10-15 cm) off the floor. Do not let your knee bend. 4. Hold this position for __________ seconds. 5. Keep these muscles tense as you lower your leg. 6. Relax your muscles slowly and completely after each repetition. Repeat __________ times. Complete this exercise __________ times a day. Exercise G: Hamstring, Isometric 1. Lie on your back on a firm surface. 2. Bend your left / right knee approximately __________ degrees. 3. Dig your left / right heel into the surface as if you are trying to pull it toward your buttocks. Tighten the muscles in the back of your thighs to dig as hard as you can without increasing any pain. 4. Hold this position for __________ seconds. 5. Release the tension gradually and allow your muscles to relax completely for __________ seconds after each repetition. Repeat __________ times. Complete this exercise __________ times a day. Exercise H: Hamstring Curls  If told by your health care provider, do this exercise while wearing ankle weights. Begin with __________ weights. Then increase the weight by 1 lb (0.5 kg) increments. Do not wear ankle weights that are more than __________. 1. Lie on your abdomen with your legs straight. 2. Bend your left / right knee as far as you can without feeling pain. Keep your hips flat against the floor. 3. Hold this position for __________ seconds. 4. Slowly lower your leg to the starting position.  Repeat __________ times. Complete this exercise __________ times a day. Exercise I: Squats (Quadriceps) 1. Stand in front of a table, with your feet and knees pointing straight ahead. You may rest your hands on the table for balance but not for support. 2. Slowly bend your knees and lower your hips like you are going to sit in a chair. ? Keep your weight over your heels, not over your toes. ? Keep your lower legs upright so they are parallel with the table  legs. ? Do not let your hips go lower than your knees. ? Do not bend lower than told by your health care provider. ? If your knee pain increases, do not bend as low. 3. Hold the squat position for __________ seconds. 4. Slowly push with your legs to return to standing. Do not use your hands to pull yourself to standing. Repeat __________ times. Complete this exercise __________ times a day. Exercise J: Wall Slides (Quadriceps)  1. Lean your back against a smooth wall or door while you walk your feet out 18-24 inches (46-61 cm) from it. 2. Place your feet hip-width apart. 3. Slowly slide down the wall or door until your knees bend __________ degrees. Keep your knees over your heels, not over your toes. Keep your knees in line with your hips. 4. Hold for __________ seconds. Repeat __________ times. Complete this exercise __________ times a day. Exercise K: Straight Leg Raises -   Hip Abductors 1. Lie on your side with your left / right leg in the top position. Lie so your head, shoulder, knee, and hip line up. You may bend your bottom knee to help you keep your balance. 2. Roll your hips slightly forward so your hips are stacked directly over each other and your left / right knee is facing forward. 3. Leading with your heel, lift your top leg 4-6 inches (10-15 cm). You should feel the muscles in your outer hip lifting. ? Do not let your foot drift forward. ? Do not let your knee roll toward the ceiling. 4. Hold this position for __________ seconds. 5. Slowly return your leg to the starting position. 6. Let your muscles relax completely after each repetition. Repeat __________ times. Complete this exercise __________ times a day. Exercise L: Straight Leg Raises - Hip Extensors 1. Lie on your abdomen on a firm surface. You can put a pillow under your hips if that is more comfortable. 2. Tense the muscles in your buttocks and lift your left / right leg about 4-6 inches (10-15 cm). Keep your knee  straight as you lift your leg. 3. Hold this position for __________ seconds. 4. Slowly lower your leg to the starting position. 5. Let your leg relax completely after each repetition. Repeat __________ times. Complete this exercise __________ times a day. This information is not intended to replace advice given to you by your health care provider. Make sure you discuss any questions you have with your health care provider. Document Released: 09/13/2005 Document Revised: 07/24/2016 Document Reviewed: 09/05/2015 Elsevier Interactive Patient Education  2018 [employer] Hand Exercises Hand exercises can be helpful to almost anyone. These exercises can strengthen the hands, improve flexibility and movement, and increase blood flow to the hands. These results can make work and daily tasks easier. Hand exercises can be especially helpful for people who have joint pain from arthritis or have nerve damage from overuse (carpal tunnel syndrome). These exercises can also help people who have injured a hand. Most of these hand exercises are fairly gentle stretching routines. You can do them often throughout the day. Still, it is a good idea to ask your health care provider which exercises would be best for you. Warming your hands before exercise may help to reduce stiffness. You can do this with gentle massage or by placing your hands in warm water for 15 minutes. Also, make sure you pay attention to your level of hand pain as you begin an exercise routine. Exercises Knuckle Bend Repeat this exercise 5-10 times with each hand. 1. Stand or sit with your arm, hand, and all five fingers pointed straight up. Make sure your wrist is straight. 2. Gently and slowly bend your fingers down and inward until the tips of your fingers are touching the tops of your palm. 3. Hold this position for a few seconds. 4. Extend your fingers out to their original position, all pointing straight up again.  Finger Fan Repeat this  exercise 5-10 times with each hand. 1. Hold your arm and hand out in front of you. Keep your wrist straight. 2. Squeeze your hand into a fist. 3. Hold this position for a few seconds. 4. Fan out, or spread apart, your hand and fingers as much as possible, stretching every joint fully.  Tabletop Repeat this exercise 5-10 times with each hand. 1. Stand or sit with your arm, hand, and all five fingers pointed straight up. Make sure your wrist is straight.   2. Gently and slowly bend your fingers at the knuckles where they meet the hand until your hand is making an upside-down L shape. Your fingers should form a tabletop. 3. Hold this position for a few seconds. 4. Extend your fingers out to their original position, all pointing straight up again.  Making Os Repeat this exercise 5-10 times with each hand. 1. Stand or sit with your arm, hand, and all five fingers pointed straight up. Make sure your wrist is straight. 2. Make an O shape by touching your pointer finger to your thumb. Hold for a few seconds. Then open your hand wide. 3. Repeat this motion with each finger on your hand.  Table Spread Repeat this exercise 5-10 times with each hand. 1. Place your hand on a table with your palm facing down. Make sure your wrist is straight. 2. Spread your fingers out as much as possible. Hold this position for a few seconds. 3. Slide your fingers back together again. Hold for a few seconds.  Ball Grip  Repeat this exercise 10-15 times with each hand. 1. Hold a tennis ball or another soft ball in your hand. 2. While slowly increasing pressure, squeeze the ball as hard as possible. 3. Squeeze as hard as you can for 3-5 seconds. 4. Relax and repeat.  Wrist Curls Repeat this exercise 10-15 times with each hand. 1. Sit in a chair that has armrests. 2. Hold a light weight in your hand, such as a dumbbell that weighs 1-3 pounds (0.5-1.4 kg). Ask your health care provider what weight would be best for  you. 3. Rest your hand just over the end of the chair arm with your palm facing up. 4. Gently pivot your wrist up and down while holding the weight. Do not twist your wrist from side to side.  Contact a health care provider if:  Your hand pain or discomfort gets much worse when you do an exercise.  Your hand pain or discomfort does not improve within 2 hours after you exercise. If you have any of these problems, stop doing these exercises right away. Do not do them again unless your health care provider says that you can. Get help right away if:  You develop sudden, severe hand pain. If this happens, stop doing these exercises right away. Do not do them again unless your health care provider says that you can. This information is not intended to replace advice given to you by your health care provider. Make sure you discuss any questions you have with your health care provider. Document Released: 10/11/2015 Document Revised: 04/06/2016 Document Reviewed: 05/10/2015 Elsevier Interactive Patient Education  2018 [employer]   Natural anti-inflammatories  You can purchase these at [employer], [employer] or online.  . Turmeric (capsules)  . Ginger (ginger root or capsules)  . Omega 3 (Fish, flax seeds, chia seeds, walnuts, almonds)  . Tart cherry (dried or extract)   Patient should be under the care of a physician while taking these supplements. This may not be reproduced without the permission of Dr. Pollyann SavoyShaili Shamon Lobo.

## 2017-12-25 ENCOUNTER — Other Ambulatory Visit: Payer: Self-pay | Admitting: Family Medicine

## 2017-12-25 NOTE — Telephone Encounter (Signed)
Hyzaar on back order per pharm - would like to break into 2 rx's. Per Dr. Pickard pt will need ov to discuss options. Will send mychart message to pt to schedule apt and send note to pharm.    

## 2018-01-08 ENCOUNTER — Ambulatory Visit: Payer: BC Managed Care – PPO | Admitting: Family Medicine

## 2018-01-08 ENCOUNTER — Encounter: Payer: Self-pay | Admitting: Family Medicine

## 2018-01-08 VITALS — BP 160/100 | HR 64 | Temp 97.9°F | Resp 14 | Ht 66.0 in | Wt 171.0 lb

## 2018-01-08 DIAGNOSIS — I10 Essential (primary) hypertension: Secondary | ICD-10-CM | POA: Diagnosis not present

## 2018-01-08 MED ORDER — LISINOPRIL-HYDROCHLOROTHIAZIDE 20-12.5 MG PO TABS: 2.0000 | ORAL_TABLET | Freq: Every day | ORAL | 11 refills | Status: DC

## 2018-01-08 MED ORDER — ALPRAZOLAM 0.5 MG PO TABS: ORAL_TABLET | ORAL | 1 refills | Status: DC

## 2018-01-08 NOTE — Progress Notes (Signed)
Subjective:    Patient ID: Sherry Daniels, female    DOB: 06-Mar-1961, 57 y.o.   MRN: [id number]  HPI  Patient has a history of hypertension.  She presents today due to significant elevation in her blood pressure.  Blood pressure is 160/100.  She is taking losartan 50/12.51 p.o. daily however this is recently been recalled.  She has not been taking propranolol which was given to her for migraine prophylaxis as well as hypertension due to symptomatic bradycardia and misunderstanding.  She thought she only took the medication when she had a headache.  However when she does take the medication, her heart rate drops substantially and she feels ill with it. Past Medical History:  Diagnosis Date  . Allergy   . Asthma   . Chest pain 11/2009   overnight hospitalization  . Constipation    Dr. Loreta AveMann  . Fibromyalgia   . GERD (gastroesophageal reflux disease)   . Hyperlipidemia   . Hypertension   . Migraine   . Nonalcoholic fatty liver disease    Past Surgical History:  Procedure Laterality Date  . ABDOMINAL HYSTERECTOMY     total  . COLONOSCOPY  09/15/2009   Dr. Loreta AveMann; normal  . WISDOM TOOTH EXTRACTION     Current Outpatient Medications on File Prior to Visit  Medication Sig Dispense Refill  . acetaminophen (TYLENOL) 500 MG tablet Take 1,500 mg by mouth every 6 (six) hours as needed. Reported on 05/01/2016    . aspirin 81 MG EC tablet 1 tablet po qOD (Patient taking differently: Take 81 mg by mouth daily. ) 30 tablet 12  . cyclobenzaprine (FLEXERIL) 10 MG tablet Take 1 tablet (10 mg total) by mouth at bedtime as needed. 30 tablet 0  . diclofenac sodium (VOLTAREN) 1 % GEL Apply 2 g topically 4 (four) times daily. (Patient taking differently: Apply 2 g topically 4 (four) times daily as needed (pain). ) 100 g 3  . eletriptan (RELPAX) 40 MG tablet TAKE 1 TAB AT ONSET OF HEADACHE.MAY REPEAT IN 2HRS IF HEADACHE PERSISTS 10 tablet 2  . estradiol (VIVELLE-DOT) 0.05 MG/24HR patch Place 1 patch onto  the skin 2 (two) times a week.   9  . ibuprofen (ADVIL,MOTRIN) 600 MG tablet Take 1 tablet (600 mg total) by mouth every 6 (six) hours as needed. 30 tablet 0  . linaclotide (LINZESS) 72 MCG capsule Take 72 mcg by mouth daily before breakfast.    . losartan-hydrochlorothiazide (HYZAAR) 50-12.5 MG tablet TAKE 1 TABLET BY MOUTH DAILY. 30 tablet 2  . Multiple Vitamins-Minerals (MULTIVITAMIN WITH MINERALS) tablet Take 1 tablet by mouth daily.    . promethazine (PHENERGAN) 12.5 MG tablet Take 1 tablet (12.5 mg total) by mouth every 8 (eight) hours as needed for nausea or vomiting. 20 tablet 0   No current facility-administered medications on file prior to visit.    Allergies  Allergen Reactions  . Aspirin Nausea Only  . Codeine Nausea Only  . Cymbalta [Duloxetine Hcl]     Felt like it worsened anxiety and fibromyalgia  . Simvastatin Other (See Comments)    dizziness   Social History   Socioeconomic History  . Marital status: Married    Spouse name: Not on file  . Number of children: Not on file  . Years of education: Not on file  . Highest education level: Not on file  Social Needs  . Financial resource strain: Not on file  . Food insecurity - worry: Not on file  .  Food insecurity - inability: Not on file  . Transportation needs - medical: Not on file  . Transportation needs - non-medical: Not on file  Occupational History  . 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 Smoking status: Never Smoker  . Smokeless tobacco: Never Used  Substance and Sexual Activity  . Alcohol use: No    Alcohol/week: 0.0 oz  . Drug use: No  . Sexual activity: Yes    Birth control/protection: Surgical  Other Topics Concern  . Not on file  Social History Narrative   Married, 1 daughter and 1 son, pentecostal, exercise most days, walking, biking, running.    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      Objective:   Physical Exam  Constitutional: She appears well-developed and well-nourished.  Cardiovascular: Normal rate, regular rhythm and normal heart sounds.  No murmur heard. Pulmonary/Chest: Effort normal and breath sounds normal. No respiratory distress. She has no wheezes. She has no rales.  Musculoskeletal: She exhibits no edema.  Vitals reviewed.         Assessment & Plan:  Essential hypertension, benign  Discontinue losartan HCTZ due to recall.  Switch to lisinopril HCTZ 20/12.5, 2 tablets p.o. daily.  Recheck blood pressure in 2 weeks.  If blood pressure is better controlled on max dose medication, I would then start the patient on Topamax for migraine prophylaxis.

## 2018-01-09 ENCOUNTER — Other Ambulatory Visit: Payer: Self-pay

## 2018-01-09 DIAGNOSIS — R21 Rash and other nonspecific skin eruption: Secondary | ICD-10-CM

## 2018-01-09 DIAGNOSIS — R5383 Other fatigue: Secondary | ICD-10-CM

## 2018-01-09 DIAGNOSIS — M2550 Pain in unspecified joint: Secondary | ICD-10-CM

## 2018-01-10 ENCOUNTER — Encounter: Payer: Self-pay | Admitting: Family Medicine

## 2018-01-10 LAB — COMPLETE METABOLIC PANEL WITH GFR
AG RATIO: 1.3 (calc) (ref 1.0–2.5)
ALBUMIN MSPROF: 3.7 g/dL (ref 3.6–5.1)
ALKALINE PHOSPHATASE (APISO): 73 U/L (ref 33–130)
ALT: 25 U/L (ref 6–29)
AST: 17 U/L (ref 10–35)
BUN: 12 mg/dL (ref 7–25)
CO2: 27 mmol/L (ref 20–32)
CREATININE: 0.64 mg/dL (ref 0.50–1.05)
Calcium: 9.1 mg/dL (ref 8.6–10.4)
Chloride: 109 mmol/L (ref 98–110)
GFR, EST NON AFRICAN AMERICAN: 101 mL/min/{1.73_m2} (ref >=60)
GFR, Est African American: 117 mL/min/{1.73_m2} (ref >=60)
GLOBULIN: 2.8 g/dL (ref 1.9–3.7)
Glucose, Bld: 89 mg/dL (ref 65–99)
POTASSIUM: 3.4 mmol/L — AB (ref 3.5–5.3)
SODIUM: 143 mmol/L (ref 135–146)
Total Bilirubin: 0.2 mg/dL (ref 0.2–1.2)
Total Protein: 6.5 g/dL (ref 6.1–8.1)

## 2018-01-10 LAB — URINALYSIS, ROUTINE W REFLEX MICROSCOPIC
Bilirubin Urine: NEGATIVE
GLUCOSE, UA: NEGATIVE
HGB URINE DIPSTICK: NEGATIVE
KETONES UR: NEGATIVE
Leukocytes, UA: NEGATIVE
NITRITE: NEGATIVE
PH: 5.5 (ref 5.0–8.0)
Protein, ur: NEGATIVE
SPECIFIC GRAVITY, URINE: 1.034 (ref 1.001–1.03)

## 2018-01-10 LAB — ANTI-SMITH ANTIBODY: ENA SM AB SER-ACNC: NEGATIVE AI

## 2018-01-10 LAB — C3 AND C4
C3 Complement: 144 mg/dL (ref 83–193)
C4 Complement: 25 mg/dL (ref 15–57)

## 2018-01-10 LAB — SJOGRENS SYNDROME-B EXTRACTABLE NUCLEAR ANTIBODY: SSB (LA) (ENA) ANTIBODY, IGG: NEGATIVE AI

## 2018-01-10 LAB — ANTI-DNA ANTIBODY, DOUBLE-STRANDED: ds DNA Ab: 1 IU/mL

## 2018-01-10 LAB — SJOGRENS SYNDROME-A EXTRACTABLE NUCLEAR ANTIBODY: SSA (RO) (ENA) ANTIBODY, IGG: NEGATIVE AI

## 2018-01-10 LAB — ANA: ANA: NEGATIVE

## 2018-01-10 LAB — ANTI-SCLERODERMA ANTIBODY: SCLERODERMA (SCL-70) (ENA) ANTIBODY, IGG: NEGATIVE AI

## 2018-01-10 LAB — RNP ANTIBODY: RIBONUCLEIC PROTEIN(ENA) ANTIBODY, IGG: POSITIVE AI — AB

## 2018-01-10 LAB — CK: Total CK: 100 U/L (ref 29–143)

## 2018-01-10 NOTE — Progress Notes (Signed)
Will discuss at follow up visit

## 2018-01-16 ENCOUNTER — Encounter: Payer: Self-pay | Admitting: Family Medicine

## 2018-01-17 ENCOUNTER — Telehealth: Payer: Self-pay | Admitting: Rheumatology

## 2018-01-17 NOTE — Telephone Encounter (Signed)
Patient called regarding the results of her bloodwork that she had done last week.  Patient saw the results in her mychart but had some questions on one of the test results.

## 2018-01-17 NOTE — Telephone Encounter (Signed)
Labs are to be discussed at new patient follow up visit. Patient will need to schedule one. Attempted to contact patient and no answer and unable to leave a message.

## 2018-01-21 ENCOUNTER — Other Ambulatory Visit: Payer: Self-pay | Admitting: Family Medicine

## 2018-01-21 MED ORDER — TOPIRAMATE 25 MG PO TABS: 50.0000 mg | ORAL_TABLET | Freq: Two times a day (BID) | ORAL | 2 refills | Status: DC

## 2018-02-15 DIAGNOSIS — M2240 Chondromalacia patellae, unspecified knee: Secondary | ICD-10-CM | POA: Insufficient documentation

## 2018-02-15 NOTE — Progress Notes (Signed)
Office Visit Note  Patient: Sherry Daniels             Date of Birth: August 07, 1961           MRN: [id number]             PCP: Donita Brooks, MD Referring: Donita Brooks, MD Visit Date: 02/18/2018 Occupation: @GUAROCC @    Subjective:  Pain in multiple joints.   History of Present Illness: Sherry Daniels is a 57 y.o. female with history of polyarthralgias.  She states she continues to have stiffness in her hands in the morning.  She had rash on her face in October without any recurrence.  She is also noticed hair loss.  She gives history of dry eyes.  She denies any joint swelling today.  She continues to have some generalized pain from fibromyalgia.  She also complains of pain in her bilateral knee joints.  Activities of Daily Living:  Patient reports morning stiffness for 20-30 minutes.   Patient Denies nocturnal pain.  Difficulty dressing/grooming: Denies Difficulty climbing stairs: Reports Difficulty getting out of chair: Reports Difficulty using hands for taps, buttons, cutlery, and/or writing: Denies   Review of Systems  Constitutional: Positive for fatigue.  HENT: Positive for mouth dryness. Negative for mouth sores and nose dryness.   Eyes: Positive for dryness. Negative for pain and visual disturbance.  Respiratory: Negative for cough, hemoptysis, shortness of breath and difficulty breathing.   Cardiovascular: Negative for chest pain, palpitations, hypertension and swelling in legs/feet.  Gastrointestinal: Positive for constipation. Negative for blood in stool and diarrhea.  Endocrine: Negative for increased urination.  Genitourinary: Negative for painful urination.  Musculoskeletal: Positive for arthralgias, joint pain, myalgias, morning stiffness, muscle tenderness and myalgias. Negative for joint swelling and muscle weakness.  Skin: Positive for hair loss. Negative for color change, pallor, rash, nodules/bumps, skin tightness, ulcers and sensitivity to  sunlight.  Allergic/Immunologic: Negative for susceptible to infections.  Neurological: Positive for headaches (Hx of migraines). Negative for dizziness, numbness and weakness.  Hematological: Negative for swollen glands.  Psychiatric/Behavioral: Positive for sleep disturbance. Negative for depressed mood. The patient is nervous/anxious.     PMFS History:  Patient Active Problem List   Diagnosis Date Noted  . Sicca complex (HCC) 02/18/2018  . History of migraine 02/18/2018  . History of pleurisy 02/18/2018  . 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 Migraines 01/29/2017  . Anemia 11/24/2011  . Headache(784.0) 11/24/2011  . Nonalcoholic fatty liver disease 11/08/2011  . Essential hypertension, benign 10/12/2011  . Hyperlipidemia 10/12/2011  . GERD (gastroesophageal reflux disease) 10/12/2011  . Fibromyalgia 10/12/2011  . Polyarthralgia 10/12/2011  . Allergy     Past Medical History:  Diagnosis Date  . Allergy   . Asthma   . Chest pain 11/2009   overnight hospitalization  . Constipation    Dr. Loreta Ave  . Fibromyalgia   . GERD (gastroesophageal reflux disease)   . Hyperlipidemia   . Hypertension   . Migraine   . Nonalcoholic fatty liver disease     Family History  Problem Relation Age of Onset  . Cancer Mother        stage III, colon  . Hypertension Mother   . Dementia Mother   . Heart disease Mother        bradycardia  . Cancer Father        prostate  . Hypertension Father   . Hyperlipidemia Father   . Heart disease Father 64  MI   . Hyperlipidemia Sister   . Hypertension Sister   . Fibromyalgia Sister   . Arthritis Brother        RA  . Hypertension Brother   . Hyperlipidemia Brother   . Fibromyalgia Brother   . Hypertension Brother   . Hyperlipidemia Brother    Past Surgical History:  Procedure Laterality Date  . ABDOMINAL HYSTERECTOMY     total  . COLONOSCOPY  09/15/2009   Dr. Loreta Ave; normal  . WISDOM TOOTH EXTRACTION     Social History    Social History Narrative   Married, 1 daughter and 1 son, pentecostal, exercise most days, walking, biking, running.    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 She appears well-developed and well-nourished.  HENT:  Head: Normocephalic and atraumatic.  Eyes: Conjunctivae and EOM are normal.  Neck: Normal range of motion.  Cardiovascular: Normal rate, regular rhythm, normal heart sounds and intact distal pulses.  Pulmonary/Chest: Effort normal and breath sounds normal.  Abdominal: Soft. Bowel sounds are normal.  Lymphadenopathy:    She has no cervical adenopathy.  Neurological: She is alert and oriented to person, place, and time.  Skin: Skin is warm and dry. Capillary refill takes less than 2 seconds.   Dark pigmentation was noted on her face but no active rash was noted.  Psychiatric: She has a normal mood and affect. Her behavior is normal.  Nursing note and vitals reviewed.    Musculoskeletal Exam: C-spine thoracic lumbar spine good range of motion.  Shoulder joints elbow joints wrist joint MCPs PIPs DIPs were in good range of motion with no synovitis.  Hip joints knee joints ankles MTPs PIPs were in good range of motion.  She is crepitus in her bilateral knee joints.  She has tenderness over bilateral trapezius area.  She has generalized hyperalgesia.  CDAI Exam: No CDAI exam completed.    Investigation: No additional findings. CBC Latest Ref Rng & Units 10/26/2017 04/07/2017 03/29/2017  WBC 4.0 - 10.5 K/uL 5.6 6.5 3.8  Hemoglobin 12.0 - 15.0 g/dL 16.1 11.6(L) 11.2(L)  Hematocrit 36.0 - 46.0 % 37.2 35.6(L) 34.5(L)  Platelets 150 - 400 K/uL 255 235 214   CMP     Component Value Date/Time   NA 143  01/09/2018 0842   K 3.4 (L) 01/09/2018 0842   CL 109 01/09/2018 0842   CO2 27 01/09/2018 0842   GLUCOSE 89 01/09/2018 0842   BUN 12 01/09/2018 0842   CREATININE 0.64 01/09/2018 0842   CALCIUM 9.1 01/09/2018 0842   PROT 6.5 01/09/2018 0842   ALBUMIN 3.6 03/29/2017 0821   AST 17 01/09/2018 0842   ALT 25 01/09/2018 0842   ALKPHOS 66 03/29/2017 0821   BILITOT 0.2 01/09/2018 0842   GFRNONAA 101 01/09/2018 0842   GFRAA 117 01/09/2018 0842  CK 100, UA negative, ANA negative, RNP 1.0 (rest of the ENA negative)  Imaging: No results found.  Speciality Comments: No specialty comments available.    Procedures:  No procedures performed Allergies: Aspirin; Codeine; Cymbalta [duloxetine hcl]; and Simvastatin   Assessment / Plan:     Visit Diagnoses: Polyarthralgia -  All autoimmune workup negative except RNP 1.0 which is borderline.  Patient gives history of a.m. stiffness in her hands.  She denies any joint swelling.  She also gives history of facial rash and hair thinning.  We had detailed discussion regarding the lab work.  At this time I do not think she has an underlying autoimmune disease.  I would like to monitor her.  Sicca complex (HCC): ANA, SSA, SSB antibodies were all negative.  Chondromalacia of patella,bilateral: Joint protection muscle strengthening was discussed.  A list of natural anti-inflammatories was discussed  And handout was given.  She has been doing some knee exercises.  Fibromyalgia: She continues to have some generalized pain and discomfort.  History of migraine  History of pleurisy - 2013  Essential hypertension, benign: Her blood pressure is in the lower limits now.  I have advised her to monitor blood pressures home and then discuss with her PCP.  Gastroesophageal reflux disease without esophagitis  Pure hypercholesterolemia  Nonalcoholic fatty liver disease    Orders: No orders of the defined types were placed in this encounter.  No orders of the  defined types were placed in this encounter.   Face-to-face time spent with patient was 30 minutes.  Greater than 50% of time was spent in counseling and coordination of care.  Follow-Up Instructions: Return in about 6 months (around 08/20/2018) for polyarthralgia, +RNP.  Will notify me if she develops any new symptoms in the meantime.   Pollyann SavoyShaili Syd Newsome, MD  Note - This record has been created using Animal [employer]agon software.  Chart creation errors have been sought, but may not always  have been located. Such creation errors do not reflect on  the standard of medical care.

## 2018-02-16 ENCOUNTER — Other Ambulatory Visit: Payer: Self-pay | Admitting: Family Medicine

## 2018-02-18 ENCOUNTER — Ambulatory Visit: Payer: BC Managed Care – PPO | Admitting: Rheumatology

## 2018-02-18 ENCOUNTER — Encounter: Payer: Self-pay | Admitting: Rheumatology

## 2018-02-18 VITALS — BP 99/70 | HR 68 | Resp 16 | Ht 66.0 in | Wt 168.5 lb

## 2018-02-18 DIAGNOSIS — M2240 Chondromalacia patellae, unspecified knee: Secondary | ICD-10-CM | POA: Diagnosis not present

## 2018-02-18 DIAGNOSIS — M3500 Sicca syndrome, unspecified: Secondary | ICD-10-CM | POA: Insufficient documentation

## 2018-02-18 DIAGNOSIS — E7800 Pure hypercholesterolemia, unspecified: Secondary | ICD-10-CM

## 2018-02-18 DIAGNOSIS — K219 Gastro-esophageal reflux disease without esophagitis: Secondary | ICD-10-CM

## 2018-02-18 DIAGNOSIS — M2550 Pain in unspecified joint: Secondary | ICD-10-CM

## 2018-02-18 DIAGNOSIS — Z8709 Personal history of other diseases of the respiratory system: Secondary | ICD-10-CM | POA: Diagnosis not present

## 2018-02-18 DIAGNOSIS — M797 Fibromyalgia: Secondary | ICD-10-CM

## 2018-02-18 DIAGNOSIS — Z8669 Personal history of other diseases of the nervous system and sense organs: Secondary | ICD-10-CM | POA: Insufficient documentation

## 2018-02-18 DIAGNOSIS — I10 Essential (primary) hypertension: Secondary | ICD-10-CM

## 2018-02-18 DIAGNOSIS — K760 Fatty (change of) liver, not elsewhere classified: Secondary | ICD-10-CM

## 2018-02-18 NOTE — Patient Instructions (Signed)
Natural anti-inflammatories  You can purchase these at [employer], [employer] or online.  . Turmeric (capsules)  . Ginger (ginger root or capsules)  . Omega 3 (Fish, flax seeds, chia seeds, walnuts, almonds)  . Tart cherry (dried or extract)   Patient should be under the care of a physician while taking these supplements. This may not be reproduced without the permission of Dr. Duong Haydel.  

## 2018-04-04 ENCOUNTER — Ambulatory Visit: Payer: BC Managed Care – PPO | Admitting: Family Medicine

## 2018-04-15 ENCOUNTER — Other Ambulatory Visit: Payer: Self-pay | Admitting: Family Medicine

## 2018-04-15 MED ORDER — ALPRAZOLAM 0.5 MG PO TABS: ORAL_TABLET | ORAL | 1 refills | Status: DC

## 2018-04-15 NOTE — Telephone Encounter (Signed)
Pt's mother passed away this weekend and she thought she had a refill on her Xanax and she does not - can we send her in a refill today?  LOV: 01/08/18  LRF: 01/08/18

## 2018-05-02 ENCOUNTER — Encounter: Payer: Self-pay | Admitting: Family Medicine

## 2018-05-21 ENCOUNTER — Encounter: Payer: Self-pay | Admitting: Family Medicine

## 2018-05-21 ENCOUNTER — Ambulatory Visit: Payer: BC Managed Care – PPO | Admitting: Family Medicine

## 2018-05-21 VITALS — BP 122/76 | HR 67 | Temp 97.4°F | Resp 14 | Ht 66.0 in | Wt 165.0 lb

## 2018-05-21 DIAGNOSIS — F410 Panic disorder [episodic paroxysmal anxiety] without agoraphobia: Secondary | ICD-10-CM | POA: Diagnosis not present

## 2018-05-21 DIAGNOSIS — F4321 Adjustment disorder with depressed mood: Secondary | ICD-10-CM

## 2018-05-21 NOTE — Progress Notes (Signed)
Patient ID: Sherry Daniels, female    DOB: [birth year], 57 y.o.   MRN: [id number]  PCP: Donita Brooks, MD  Chief Complaint  Patient presents with  . Anxiety    lost mom back in May     Subjective:   Sherry Daniels is a 57 y.o. female, presents to clinic with CC of difficulty with loss of her mother occurred May 20 of this year.  She is having panic attacks that are worse at night, waking her from her sleep sometimes, often occur at 2 or 3 in the morning and recurrent 6 or 7 in the morning.  She routinely sobs in the shower.  Her sx have been constant since the death of her mother, no alleviating factors, many exacerbating factors.  Sx moderate to severe causing difficulty handling at home and at work.  She has been prescribed Xanax by her PCP which does help her get to sleep but it has not seemed to decrease the intensity frequency or onset of waves of panic and grief.  Her husband and her brothers have been dealing with the loss of their mother with going to work, diving into the careers, diving into the church activities or simply by keeping busy and working around the house in the yard.  Her husband has encouraged her to do the same but some morning she feels like she cannot get out of bed and cannot move.  She has forced herself to go to work several times, she has difficulty focusing, often become suddenly overwhelmed with emotion or saddness and has to leave. 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  She is requesting a work note to help her deal with her grief.  She is going to a grief counselor tomorrow at [facility]" with counselor named Games developer.  She has never been to counseling before, she is concerned that it will be emotionally draining and that she will not be able to function  this week.  She cannot use her faith as an outlet because her brother is her pastor.  She wants to know if xanax helps prevent the panic attacks.  She has never been on any psychiatric medication before.  She is willing to try different night time medication but does not currently think she needs daily meds for panic attacks or depression.  She wonders if she should be feeling better since it has been over a month since her mothers death.   She feels like her work with work with her, and she does have vacation/sick days that she can use, but she was hoping to get a note to help excuse her and allow her to start trying the grief counselor. PHQ score - 16 No HI, SI, AVH   Patient Active Problem List   Diagnosis Date Noted  . Sicca complex (HCC) 02/18/2018  . History of migraine 02/18/2018  . History of pleurisy 02/18/2018  . 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 Migraines 01/29/2017  . Anemia 11/24/2011  . Headache(784.0) 11/24/2011  . Nonalcoholic fatty liver disease 11/08/2011  . Essential hypertension, benign 10/12/2011  . Hyperlipidemia 10/12/2011  . GERD (gastroesophageal reflux disease) 10/12/2011  . Fibromyalgia 10/12/2011  . Polyarthralgia 10/12/2011  . Allergy  Prior to Admission medications   Medication Sig Start Date End Date Taking? Authorizing Provider  acetaminophen (TYLENOL) 500 MG tablet Take 1,500 mg by mouth every 6 (six) hours as needed. Reported on 05/01/2016   Yes [provider]  ALPRAZolam Prudy Feeler(XANAX) 0.5 MG tablet TAKE 1 TABLET BY MOUTH AT BEDTIME AS NEEDED FOR ANXIETY 04/15/18  Yes [place], Velna HatchetKawanta F, MD  aspirin 81 MG EC tablet 1 tablet po qOD Patient taking differently: Take 81 mg by mouth daily.  07/05/12  Yes Tysinger, Kermit Baloavid S, PA-C  cyclobenzaprine (FLEXERIL) 10 MG tablet Take 1 tablet (10 mg total) by mouth at bedtime as needed. 03/09/17  Yes [place], Velna HatchetKawanta F, MD  diclofenac sodium (VOLTAREN) 1 % GEL Apply 2 g topically 4 (four) times daily. Patient  taking differently: Apply 2 g topically 4 (four) times daily as needed (pain).  03/09/17  Yes [place], Velna HatchetKawanta F, MD  eletriptan (RELPAX) 40 MG tablet TAKE 1 TAB AT ONSET OF HEADACHE.MAY REPEAT IN 2HRS IF HEADACHE PERSISTS 01/29/17  Yes [place], Velna HatchetKawanta F, MD  estradiol (VIVELLE-DOT) 0.05 MG/24HR patch Place 1 patch onto the skin 2 (two) times a week.  10/17/14  Yes [provider]  ibuprofen (ADVIL,MOTRIN) 600 MG tablet Take 1 tablet (600 mg total) by mouth every 6 (six) hours as needed. 10/16/15  Yes [place], Dorise HissElizabeth C, PA-C  linaclotide (LINZESS) 72 MCG capsule Take 72 mcg by mouth daily before breakfast.   Yes [provider]  lisinopril-hydrochlorothiazide (ZESTORETIC) 20-12.5 MG tablet Take 2 tablets by mouth daily. 01/08/18  Yes Donita BrooksPickard, Warren T, MD  Multiple Vitamins-Minerals (MULTIVITAMIN WITH MINERALS) tablet Take 1 tablet by mouth daily.   Yes [provider]  promethazine (PHENERGAN) 12.5 MG tablet Take 1 tablet (12.5 mg total) by mouth every 8 (eight) hours as needed for nausea or vomiting. 01/29/17  Yes [place], Velna HatchetKawanta F, MD  topiramate (TOPAMAX) 25 MG tablet TAKE 2 TABLETS (50 MG TOTAL) BY MOUTH 2 (TWO) TIMES DAILY. 02/18/18  Yes Donita BrooksPickard, Warren T, MD     Allergies  Allergen Reactions  . Aspirin Nausea Only  . Codeine Nausea Only  . Cymbalta [Duloxetine Hcl]     Felt like it worsened anxiety and fibromyalgia  . Simvastatin Other (See Comments)    dizziness     Family History  Problem Relation Age of Onset  . Cancer Mother        stage III, colon  . Hypertension Mother   . Dementia Mother   . Heart disease Mother        bradycardia  . Cancer Father        prostate  . Hypertension Father   . Hyperlipidemia Father   . Heart disease Father 2240       MI   . Hyperlipidemia Sister   . Hypertension Sister   . Fibromyalgia Sister   . Arthritis Brother        RA  . Hypertension Brother   . Hyperlipidemia Brother   . Fibromyalgia Brother   .  Hypertension Brother   . Hyperlipidemia Brother      Social History   Socioeconomic History  . Marital status: Married    Spouse name: Not on file  . Number of children: Not on file  . Years of education: Not on file  . Highest education level: Not on file  Occupational History  . 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 Physicist, medicalinancial resource  strain: Not on file  . Food insecurity:    Worry: Not on file    Inability: Not on file  . Transportation needs:    Medical: Not on file    Non-medical: Not on file  Tobacco Use  . Smoking status: Never Smoker  . Smokeless tobacco: Never Used  Substance and Sexual Activity  . Alcohol use: No    Alcohol/week: 0.0 oz  . Drug use: No  . Sexual activity: Yes    Birth control/protection: Surgical  Lifestyle  . Physical activity:    Days per week: Not on file    Minutes per session: Not on file  . Stress: Not on file  Relationships  . Social connections:    Talks on phone: Not on file    Gets together: Not on file    Attends religious service: Not on file    Active member of club or organization: Not on file    Attends meetings of clubs or organizations: Not on file    Relationship status: Not on file  . Intimate partner violence:    Fear of current or ex partner: Not on file    Emotionally abused: Not on file    Physically abused: Not on file    Forced sexual activity: Not on file  Other Topics Concern  . Not on file  Social History Narrative   Married, 1 daughter and 1 son, pentecostal, exercise most days, walking, biking, running.    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   Psychiatric/Behavioral: Positive for decreased concentration, dysphoric mood and sleep disturbance. Negative for agitation, behavioral problems, confusion, hallucinations, self-injury and suicidal ideas. The patient is not nervous/anxious and  is not hyperactive.        Objective:    Vitals:   05/21/18 1141  BP: 122/76  Pulse: 67  Resp: 14  Temp: (!) 97.4 F (36.3 C)  TempSrc: Oral  SpO2: 98%  Weight: 165 lb (74.8 kg)  Height: 5\' 6"  (1.676 m)      Physical Exam  Constitutional: She appears well-developed and well-nourished. No distress.  HENT:  Head: Normocephalic and atraumatic.  Nose: Nose normal.  Eyes: Pupils are equal, round, and reactive to light. Conjunctivae are normal. Right eye exhibits no discharge. Left eye exhibits no discharge.  Neck: No tracheal deviation present.  Cardiovascular: Normal rate and regular rhythm.  Pulmonary/Chest: Effort normal. No stridor. No respiratory distress.  Musculoskeletal: Normal range of motion.  Neurological: She is alert. She exhibits normal muscle tone. Coordination normal.  Skin: Skin is warm and dry. No rash noted. She is not diaphoretic.  Psychiatric: She has a normal mood and affect. Her speech is normal and behavior is normal. Judgment normal. She is not actively hallucinating. Cognition and memory are normal. She expresses no homicidal and no suicidal ideation. She expresses no suicidal plans and no homicidal plans. She is attentive.  Nursing note and vitals reviewed.         Assessment & Plan:      ICD-10-CM   1. Grief reaction F43.21   2. Panic attacks F41.0     Discussed continuing xanax as prescribed, recent refill sent in 04/15/18.   She is going to grief counselor tomorrow - at [facility] Pt stated Games developer (per website [place], [place], LPC, NCC?)  Pt to call us back tomorrow to confirm. Work note for One week off work, to closely follow up  next week with Dr. Tanya Nones. Discussed use of/need to use FMLA to protect job Also discussed SSRI to help prevent the panic attacks, but will see counseling first before she decides.  Described and advised of 1-2 months to get meds to steady state to see effects and help prevent anxiety or panic  attacks, she verbalized understanding of process.   Pt very appropriate in room, no SI.   Danelle Berry, PA-C 05/21/18 12:18 PM

## 2018-05-27 ENCOUNTER — Encounter: Payer: Self-pay | Admitting: Family Medicine

## 2018-05-27 ENCOUNTER — Ambulatory Visit: Payer: BC Managed Care – PPO | Admitting: Family Medicine

## 2018-05-27 VITALS — BP 118/70 | HR 62 | Temp 98.0°F | Resp 14 | Ht 66.0 in | Wt 160.0 lb

## 2018-05-27 DIAGNOSIS — F410 Panic disorder [episodic paroxysmal anxiety] without agoraphobia: Secondary | ICD-10-CM | POA: Diagnosis not present

## 2018-05-27 DIAGNOSIS — F4321 Adjustment disorder with depressed mood: Secondary | ICD-10-CM

## 2018-05-27 NOTE — Progress Notes (Signed)
Subjective:    Patient ID: Sherry Daniels, female    DOB: 1961-02-05, 57 y.o.   MRN: [id number]  HPI  I reviewed the patient's recent office visit with my partner.  Her mother died [facility].  She been battling dementia for many years.  Patient took approximately 1 week away from work and then returned to work.  However she states that her grief has gradually worsened over the last several weeks and now the patient no longer feels comfortable at work.  She states that she has poor concentration.  She feels extremely sad all the time.  She is unable to sleep.  She has no appetite.  She also has frequent panic attacks.  For all these reasons, she does not feel that she is on her "a game".  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  She is afraid she may make a mistake that may cause future legal issues or lawsuits.  Therefore she is requesting a leave of absence from her current position to allow her time to grieve.  She was seen last week by a therapist.  However the majority of the visit was in intake and they have not yet started formal therapy. Past Medical History:  Diagnosis Date  . Allergy   . Asthma   . Chest pain 11/2009   overnight hospitalization  . Constipation    Dr. Loreta AveMann  . Fibromyalgia   . GERD (gastroesophageal reflux disease)   . Hyperlipidemia   . Hypertension   . Migraine   . Nonalcoholic fatty liver disease    Past Surgical History:  Procedure Laterality Date  . ABDOMINAL HYSTERECTOMY     total  . COLONOSCOPY  09/15/2009   Dr. Loreta AveMann; normal  . WISDOM TOOTH EXTRACTION     Current Outpatient Medications on File Prior to Visit  Medication Sig Dispense Refill  . acetaminophen (TYLENOL) 500 MG tablet Take 1,500 mg by mouth every 6 (six) hours as needed. Reported on 05/01/2016    . ALPRAZolam (XANAX) 0.5 MG tablet TAKE 1 TABLET BY MOUTH AT BEDTIME AS NEEDED FOR ANXIETY 30 tablet 1  . aspirin 81 MG EC  tablet 1 tablet po qOD (Patient taking differently: Take 81 mg by mouth daily. ) 30 tablet 12  . cyclobenzaprine (FLEXERIL) 10 MG tablet Take 1 tablet (10 mg total) by mouth at bedtime as needed. 30 tablet 0  . diclofenac sodium (VOLTAREN) 1 % GEL Apply 2 g topically 4 (four) times daily. (Patient taking differently: Apply 2 g topically 4 (four) times daily as needed (pain). ) 100 g 3  . eletriptan (RELPAX) 40 MG tablet TAKE 1 TAB AT ONSET OF HEADACHE.MAY REPEAT IN 2HRS IF HEADACHE PERSISTS 10 tablet 2  . estradiol (VIVELLE-DOT) 0.1 MG/24HR patch APPLY 1 NEW PATCH TWICE WEEKLY  3  . ibuprofen (ADVIL,MOTRIN) 600 MG tablet Take 1 tablet (600 mg total) by mouth every 6 (six) hours as needed. 30 tablet 0  . linaclotide (LINZESS) 72 MCG capsule Take 72 mcg by mouth daily before breakfast.    . lisinopril-hydrochlorothiazide (ZESTORETIC) 20-12.5 MG tablet Take 2 tablets by mouth daily. 60 tablet 11  . Multiple Vitamins-Minerals (MULTIVITAMIN WITH MINERALS) tablet Take 1 tablet by mouth daily.    . promethazine (PHENERGAN) 12.5 MG tablet Take 1 tablet (12.5 mg total) by mouth every 8 (eight) hours as needed for nausea or vomiting. 20 tablet 0  . topiramate (TOPAMAX) 25 MG tablet  TAKE 2 TABLETS (50 MG TOTAL) BY MOUTH 2 (TWO) TIMES DAILY. 120 tablet 3   No current facility-administered medications on file prior to visit.    Allergies  Allergen Reactions  . Aspirin Nausea Only  . Codeine Nausea Only  . Cymbalta [Duloxetine Hcl]     Felt like it worsened anxiety and fibromyalgia  . Simvastatin Other (See Comments)    dizziness   Social History   Socioeconomic History  . Marital status: Married    Spouse name: Not on file  . Number of children: Not on file  . Years of education: Not on file  . Highest education level: Not on file  Occupational History  . 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 Financial resource strain: Not on file   . Food insecurity:    Worry: Not on file    Inability: Not on file  . Transportation needs:    Medical: Not on file    Non-medical: Not on file  Tobacco Use  . Smoking status: Never Smoker  . Smokeless tobacco: Never Used  Substance and Sexual Activity  . Alcohol use: No    Alcohol/week: 0.0 oz  . Drug use: No  . Sexual activity: Yes    Birth control/protection: Surgical  Lifestyle  . Physical activity:    Days per week: Not on file    Minutes per session: Not on file  . Stress: Not on file  Relationships  . Social connections:    Talks on phone: Not on file    Gets together: Not on file    Attends religious service: Not on file    Active member of club or organization: Not on file    Attends meetings of clubs or organizations: Not on file    Relationship status: Not on file  . Intimate partner violence:    Fear of current or ex partner: Not on file    Emotionally abused: Not on file    Physically abused: Not on file    Forced sexual activity: Not on file  Other Topics Concern  . Not on file  Social History Narrative   Married, 1 daughter and 1 son, pentecostal, exercise most days, walking, biking, running.    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      Objective:   Physical Exam  Constitutional: She appears well-developed and well-nourished.  Cardiovascular: Normal rate, regular rhythm and normal heart sounds.  No murmur heard. Pulmonary/Chest: Effort normal and breath sounds normal. No respiratory distress. She has no wheezes. She has no rales.  Musculoskeletal: She exhibits no edema.  Vitals reviewed.         Assessment & Plan:  Grief reaction, panic attacks, depression  I believe the patient's symptoms are unusual for the typical grieving process.  In fact the poor concentration, decreased energy, poor appetite, insomnia all concerns me about possible underlying depression in  addition to the normal grieving process.  I am also concerned because the patient seems to be worsening after 8 weeks rather than improving.  Therefore I have recommended treatment for depression with Lexapro 10 mg a day.  Patient previously had suicidal thoughts on Cymbalta and is very concerned about taking medication.  She is requesting a temporary leave of absence.  Allow 2 weeks and then reassess.

## 2018-05-30 ENCOUNTER — Ambulatory Visit: Payer: BC Managed Care – PPO | Admitting: Family Medicine

## 2018-06-07 ENCOUNTER — Encounter: Payer: Self-pay | Admitting: Family Medicine

## 2018-06-07 ENCOUNTER — Ambulatory Visit: Payer: BC Managed Care – PPO | Admitting: Family Medicine

## 2018-06-07 VITALS — BP 110/70 | HR 67 | Temp 97.9°F | Resp 16 | Ht 66.0 in | Wt 163.0 lb

## 2018-06-07 DIAGNOSIS — F321 Major depressive disorder, single episode, moderate: Secondary | ICD-10-CM | POA: Diagnosis not present

## 2018-06-07 DIAGNOSIS — F4321 Adjustment disorder with depressed mood: Secondary | ICD-10-CM | POA: Diagnosis not present

## 2018-06-07 MED ORDER — LISINOPRIL-HYDROCHLOROTHIAZIDE 20-12.5 MG PO TABS: 1.0000 | ORAL_TABLET | Freq: Every day | ORAL | 11 refills | Status: DC

## 2018-06-07 MED ORDER — CLONAZEPAM 0.5 MG PO TABS: 0.5000 mg | ORAL_TABLET | Freq: Two times a day (BID) | ORAL | 0 refills | Status: DC | PRN

## 2018-06-07 MED ORDER — ESCITALOPRAM OXALATE 10 MG PO TABS: 10.0000 mg | ORAL_TABLET | Freq: Every day | ORAL | 2 refills | Status: DC

## 2018-06-07 NOTE — Progress Notes (Signed)
Subjective:    Patient ID: Sherry Daniels, female    DOB: 1961/01/12, 57 y.o.   MRN: [id number]  HPI 05/27/18 I reviewed the patient's recent office visit with my partner.  Her mother died around [facility].  She been battling dementia for many years.  Patient took approximately 1 week away from work and then returned to work.  However she states that her grief has gradually worsened over the last several weeks and now the patient no longer feels comfortable at work.  She states that she has poor concentration.  She feels extremely sad all the time.  She is unable to sleep.  She has no appetite.  She also has frequent panic attacks.  For all these reasons, she does not feel that she is on her "a game".  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  She is afraid she may make a mistake that may cause future legal issues or lawsuits.  Therefore she is requesting a leave of absence from her current position to allow her time to grieve.  She was seen last week by a therapist.  However the majority of the visit was in intake and they have not yet started formal therapy.  At that time, my plan was: I believe the patient's symptoms are unusual for the typical grieving process.  In fact the poor concentration, decreased energy, poor appetite, insomnia all concerns me about possible underlying depression in addition to the normal grieving process.  I am also concerned because the patient seems to be worsening after 8 weeks rather than improving.  Therefore I have recommended treatment for depression with Lexapro 10 mg a day.  Patient previously had suicidal thoughts on Cymbalta and is very concerned about taking medication.  She is requesting a temporary leave of absence.  Allow 2 weeks and then reassess.    06/07/18 Patient seems to be doing worse.  She continues to endorse trouble sleeping.  She endorses trouble concentrating.  She has very little  energy.  She continues to have occasional anxiety attacks.  She continues to report anhedonia feeling sad.  It is hard for her to go to work.  Frequently she starts her day at 10:00 in the morning.  She is only able to work a few hours before she has to go home.  Often she finds her self not accomplishing much when she is at work due to inability to focus and easy distractibility.  She is meeting with a grief counselor.  She is interested in taking Klonopin which a friend takes to help her sleep. Past Medical History:  Diagnosis Date  . Allergy   . Asthma   . Chest pain 11/2009   overnight hospitalization  . Constipation    Dr. Loreta Ave  . Fibromyalgia   . GERD (gastroesophageal reflux disease)   . Hyperlipidemia   . Hypertension   . Migraine   . Nonalcoholic fatty liver disease    Past Surgical History:  Procedure Laterality Date  . ABDOMINAL HYSTERECTOMY     total  . COLONOSCOPY  09/15/2009   Dr. Loreta Ave; normal  . WISDOM TOOTH EXTRACTION     Current Outpatient Medications on File Prior to Visit  Medication Sig Dispense Refill  . acetaminophen (TYLENOL) 500 MG tablet Take 1,500 mg by mouth every 6 (six) hours as needed. Reported on 05/01/2016    . ALPRAZolam (XANAX) 0.5 MG tablet TAKE 1 TABLET BY MOUTH AT BEDTIME  AS NEEDED FOR ANXIETY 30 tablet 1  . aspirin 81 MG EC tablet 1 tablet po qOD (Patient taking differently: Take 81 mg by mouth daily. ) 30 tablet 12  . cyclobenzaprine (FLEXERIL) 10 MG tablet Take 1 tablet (10 mg total) by mouth at bedtime as needed. 30 tablet 0  . diclofenac sodium (VOLTAREN) 1 % GEL Apply 2 g topically 4 (four) times daily. (Patient taking differently: Apply 2 g topically 4 (four) times daily as needed (pain). ) 100 g 3  . eletriptan (RELPAX) 40 MG tablet TAKE 1 TAB AT ONSET OF HEADACHE.MAY REPEAT IN 2HRS IF HEADACHE PERSISTS 10 tablet 2  . estradiol (VIVELLE-DOT) 0.1 MG/24HR patch APPLY 1 NEW PATCH TWICE WEEKLY  3  . ibuprofen (ADVIL,MOTRIN) 600 MG tablet Take 1  tablet (600 mg total) by mouth every 6 (six) hours as needed. 30 tablet 0  . linaclotide (LINZESS) 72 MCG capsule Take 72 mcg by mouth daily before breakfast.    . lisinopril-hydrochlorothiazide (ZESTORETIC) 20-12.5 MG tablet Take 2 tablets by mouth daily. 60 tablet 11  . Multiple Vitamins-Minerals (MULTIVITAMIN WITH MINERALS) tablet Take 1 tablet by mouth daily.    . promethazine (PHENERGAN) 12.5 MG tablet Take 1 tablet (12.5 mg total) by mouth every 8 (eight) hours as needed for nausea or vomiting. 20 tablet 0  . topiramate (TOPAMAX) 25 MG tablet TAKE 2 TABLETS (50 MG TOTAL) BY MOUTH 2 (TWO) TIMES DAILY. 120 tablet 3   No current facility-administered medications on file prior to visit.    Allergies  Allergen Reactions  . Aspirin Nausea Only  . Codeine Nausea Only  . Cymbalta [Duloxetine Hcl]     Felt like it worsened anxiety and fibromyalgia  . Simvastatin Other (See Comments)    dizziness   Social History   Socioeconomic History  . Marital status: Married    Spouse name: Not on file  . Number of children: Not on file  . Years of education: Not on file  . Highest education level: Not on file  Occupational History  . 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 Financial resource strain: Not on file  . Food insecurity:    Worry: Not on file    Inability: Not on file  . Transportation needs:    Medical: Not on file    Non-medical: Not on file  Tobacco Use  . Smoking status: Never Smoker  . Smokeless tobacco: Never Used  Substance and Sexual Activity  . Alcohol use: No    Alcohol/week: 0.0 oz  . Drug use: No  . Sexual activity: Yes    Birth control/protection: Surgical  Lifestyle  . Physical activity:    Days per week: Not on file    Minutes per session: Not on file  . Stress: Not on file  Relationships  . Social connections:    Talks on phone: Not on file    Gets together: Not on file    Attends  religious service: Not on file    Active member of club or organization: Not on file    Attends meetings of clubs or organizations: Not on file    Relationship status: Not on file  . Intimate partner violence:    Fear of current or ex partner: Not on file    Emotionally abused: Not on file    Physically abused: Not on file    Forced sexual activity: Not on file  Other Topics  Concern  . Not on file  Social History Narrative   Married, 1 daughter and 1 son, pentecostal, exercise most days, walking, biking, running.    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      Objective:   Physical Exam  Constitutional: She appears well-developed and well-nourished.  Cardiovascular: Normal rate, regular rhythm and normal heart sounds.  No murmur heard. Pulmonary/Chest: Effort normal and breath sounds normal. No respiratory distress. She has no wheezes. She has no rales.  Musculoskeletal: She exhibits no edema.  Vitals reviewed.         Assessment & Plan:  Grief reaction  Depression, major, single episode, moderate (HCC)  I recommended discontinuation of Xanax.  I explained to the patient the Klonopin and Xanax or equivalent although the Klonopin may provide more sustained long-lasting relief from anxiety.  Therefore she can take Klonopin 0.5 mg p.o. twice daily.  I would also start the patient on Lexapro 10 mg a day and recheck the patient in 4 weeks or sooner if symptoms are worsening.  Also her blood pressures been relatively low recently.  I recommended decreasing her dose from 2 pills a day to 1 pill a day and then rechecking in 1 month.

## 2018-06-10 ENCOUNTER — Ambulatory Visit: Payer: BC Managed Care – PPO | Admitting: Family Medicine

## 2018-06-19 ENCOUNTER — Telehealth: Payer: Self-pay | Admitting: Family Medicine

## 2018-06-19 NOTE — Telephone Encounter (Signed)
Pt called Sherry Daniels stating that she is still having low BP's along with the lightheadedness and dizziness and does not think the lisinopril is the medication for her with her continued symptoms. She wanted to know what would be the next step?

## 2018-06-20 NOTE — Telephone Encounter (Signed)
Patient aware of providers recommendations.  

## 2018-06-20 NOTE — Telephone Encounter (Signed)
Hold lisinopril altogether and see how her blood pressure reacts (how high does it get over the next week).  If it rises above 140/90 we can replace lisinopril with something else.

## 2018-06-21 ENCOUNTER — Other Ambulatory Visit: Payer: Self-pay | Admitting: Family Medicine

## 2018-08-01 ENCOUNTER — Other Ambulatory Visit: Payer: Self-pay | Admitting: Family Medicine

## 2018-08-02 ENCOUNTER — Encounter: Payer: Self-pay | Admitting: Family Medicine

## 2018-08-02 ENCOUNTER — Other Ambulatory Visit: Payer: Self-pay

## 2018-08-02 ENCOUNTER — Ambulatory Visit (INDEPENDENT_AMBULATORY_CARE_PROVIDER_SITE_OTHER): Payer: BC Managed Care – PPO | Admitting: Family Medicine

## 2018-08-02 VITALS — BP 134/68 | HR 62 | Temp 98.4°F | Resp 14 | Ht 66.0 in | Wt 165.0 lb

## 2018-08-02 DIAGNOSIS — M542 Cervicalgia: Secondary | ICD-10-CM

## 2018-08-02 DIAGNOSIS — M549 Dorsalgia, unspecified: Secondary | ICD-10-CM | POA: Diagnosis not present

## 2018-08-02 MED ORDER — CYCLOBENZAPRINE HCL 5 MG PO TABS: 5.0000 mg | ORAL_TABLET | Freq: Three times a day (TID) | ORAL | 1 refills | Status: AC | PRN
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
## 2018-08-07 ENCOUNTER — Other Ambulatory Visit: Payer: Self-pay | Admitting: Family Medicine

## 2018-08-16 ENCOUNTER — Ambulatory Visit: Payer: BC Managed Care – PPO | Admitting: Family Medicine

## 2018-08-22 ENCOUNTER — Encounter: Payer: Self-pay | Admitting: Physician Assistant

## 2018-08-22 ENCOUNTER — Encounter: Payer: Self-pay | Admitting: Family Medicine

## 2018-08-22 ENCOUNTER — Ambulatory Visit: Payer: BC Managed Care – PPO | Admitting: Physician Assistant

## 2018-08-22 VITALS — BP 170/88 | HR 63 | Temp 98.0°F | Ht 66.0 in | Wt 168.8 lb

## 2018-08-22 DIAGNOSIS — I10 Essential (primary) hypertension: Secondary | ICD-10-CM | POA: Diagnosis not present

## 2018-08-22 MED ORDER — HYDROCHLOROTHIAZIDE 12.5 MG PO CAPS: 12.5000 mg | ORAL_CAPSULE | Freq: Every day | ORAL | 0 refills | Status: DC

## 2018-08-22 NOTE — Progress Notes (Signed)
Patient ID: HAISLEY ARENS MRN: [id number], DOB: 1961/01/23, 57 y.o. Date of Encounter: 08/22/2018, 12:01 PM    Chief Complaint:  Chief Complaint  Patient presents with  . Hypertension     HPI: 57 y.o. year old female presents with above.   She brings in her wrist blood pressure machine that is digital. She shows me the different readings that are recorded recently. The first reading in this list is the most recent and then going back in time. 202/118 151/89 145/92 147/78 133/73 153/88 173/111 170/105  She reports that she had been on lisinopril 2 a day but then her blood pressure got low and she was feeling lightheaded so they decreased it down to 1 a day.  Then she had her appointment at rheumatology and they noted blood pressure was low so then Dr. Tanya Nones completely discontinued the lisinopril.  However since then patient has been concerned of blood pressure not being controlled. She says that a prior PCP had told her that once a person is on blood pressure medicine they are generally on blood pressure medicine for life/always on blood pressure medicine. Says that she guesses that that thought is part of her concern. Also states that over the last couple of days since getting these higher readings she just has not really felt completely normal.  Feels a little nauseous and a little bit of a headache.  States that years ago she had been on HCTZ and felt better when she was on HCTZ.   Feels like the lisinopril is too strong for her and dropped her blood pressure too much.     Home Meds:   Outpatient Medications Prior to Visit  Medication Sig Dispense Refill  . acetaminophen (TYLENOL) 500 MG tablet Take 1,500 mg by mouth every 6 (six) hours as needed. Reported on 05/01/2016    . aspirin 81 MG EC tablet 1 tablet po qOD (Patient taking differently: Take 81 mg by mouth daily. ) 30 tablet 12  . clonazePAM (KLONOPIN) 0.5 MG tablet Take 1 tablet (0.5 mg total) by mouth 2  (two) times daily as needed for anxiety. 60 tablet 0  . diclofenac sodium (VOLTAREN) 1 % GEL Apply 2 g topically 4 (four) times daily. (Patient taking differently: Apply 2 g topically 4 (four) times daily as needed (pain). ) 100 g 3  . eletriptan (RELPAX) 40 MG tablet TAKE 1 TAB AT ONSET OF HEADACHE.MAY REPEAT IN 2HRS IF HEADACHE PERSISTS 10 tablet 2  . escitalopram (LEXAPRO) 10 MG tablet TAKE 1 TABLET BY MOUTH EVERY DAY 90 tablet 1  . estradiol (VIVELLE-DOT) 0.1 MG/24HR patch APPLY 1 NEW PATCH TWICE WEEKLY  3  . ibuprofen (ADVIL,MOTRIN) 600 MG tablet Take 1 tablet (600 mg total) by mouth every 6 (six) hours as needed. 30 tablet 0  . linaclotide (LINZESS) 72 MCG capsule Take 72 mcg by mouth daily before breakfast.    . Misc Natural Products (TART CHERRY ADVANCED PO) Take by mouth.    . Multiple Vitamins-Minerals (MULTIVITAMIN WITH MINERALS) tablet Take 1 tablet by mouth daily.    . Omega-3 1000 MG CAPS Take by mouth.    . promethazine (PHENERGAN) 12.5 MG tablet Take 1 tablet (12.5 mg total) by mouth every 8 (eight) hours as needed for nausea or vomiting. 20 tablet 0  . topiramate (TOPAMAX) 25 MG tablet TAKE 2 TABLETS (50 MG TOTAL) BY MOUTH 2 (TWO) TIMES DAILY. 120 tablet 3  . TURMERIC PO Take by mouth.  No facility-administered medications prior to visit.     Allergies:  Allergies  Allergen Reactions  . Aspirin Nausea Only  . Codeine Nausea Only  . Cymbalta [Duloxetine Hcl]     Felt like it worsened anxiety and fibromyalgia  . Simvastatin Other (See Comments)    dizziness      Review of Systems: See HPI for pertinent ROS. All other ROS negative.    Physical Exam: Blood pressure (!) 170/88, pulse 63, temperature 98 F (36.7 C), temperature source Oral, height 5\' 6"  (1.676 m), weight 76.6 kg, SpO2 100 %., Body mass index is 27.25 kg/m. General: WNWD AAF.  Appears in no acute distress. Neck: Supple. No thyromegaly. No lymphadenopathy. No carotid bruits. Lungs: Clear bilaterally  to auscultation without wheezes, rales, or rhonchi. Breathing is unlabored. Heart: Regular rhythm. No murmurs, rubs, or gallops. Msk:  Strength and tone normal for age. Extremities/Skin: Warm and dry.  No LE edema.  Neuro: Alert and oriented X 3. Moves all extremities spontaneously. Gait is normal. CNII-XII grossly in tact. Psych:  Responds to questions appropriately with a normal affect.     ASSESSMENT AND PLAN:  57 y.o. year old female with   1. Essential hypertension, benign I rechecked her blood pressure twice on the right.  I do hear it clearly.  Getting around 170 -- 180 over about 94 -- 100. I did review that at her visit here 08/02/2018 BP reading was 134/68.  Was only 2 or 3 weeks ago.   Also she is reporting that her blood pressure was getting low and she was having lightheadedness with lisinopril.   Recently lisinopril dose was decreased and then discontinued secondary to low blood pressures and lightheadedness. Therefore I am concerned that her blood pressure fluctuates quite a bit.   Therefore concerned that it is reading higher today than usual.   Also sounds like she has been fairly sensitive to meds.   She has tolerated HCTZ well in the past therefore will return to HCT but will use very low dose and monitor. Will have her return for follow-up in 2 weeks to recheck BP and bmet.  - hydrochlorothiazide (MICROZIDE) 12.5 MG capsule; Take 1 capsule (12.5 mg total) by mouth daily.  Dispense: 30 capsule; Refill: 0   Signed, [address] [place], [place], [facility] 08/22/2018 12:01 PM

## 2018-09-05 ENCOUNTER — Ambulatory Visit: Payer: BC Managed Care – PPO | Admitting: Physician Assistant

## 2018-09-09 ENCOUNTER — Other Ambulatory Visit: Payer: Self-pay

## 2018-09-09 ENCOUNTER — Encounter: Payer: Self-pay | Admitting: Family Medicine

## 2018-09-09 ENCOUNTER — Ambulatory Visit (INDEPENDENT_AMBULATORY_CARE_PROVIDER_SITE_OTHER): Payer: BC Managed Care – PPO | Admitting: Family Medicine

## 2018-09-09 VITALS — BP 154/98 | HR 62 | Temp 98.1°F | Resp 14 | Ht 66.0 in | Wt 167.0 lb

## 2018-09-09 DIAGNOSIS — G43711 Chronic migraine without aura, intractable, with status migrainosus: Secondary | ICD-10-CM | POA: Diagnosis not present

## 2018-09-09 DIAGNOSIS — I10 Essential (primary) hypertension: Secondary | ICD-10-CM

## 2018-09-09 MED ORDER — AMLODIPINE BESYLATE 5 MG PO TABS: 5.0000 mg | ORAL_TABLET | Freq: Every day | ORAL | 3 refills | Status: DC

## 2018-09-09 MED ORDER — PROMETHAZINE HCL 25 MG/ML IJ SOLN
25.0000 mg | Freq: Once | INTRAMUSCULAR | Status: AC
  Administered 2018-09-09: 25 mg via INTRAMUSCULAR

## 2018-09-09 MED ORDER — ELETRIPTAN HYDROBROMIDE 40 MG PO TABS: ORAL_TABLET | ORAL | 2 refills | Status: DC

## 2018-09-09 MED ORDER — KETOROLAC TROMETHAMINE 60 MG/2ML IM SOLN
60.0000 mg | Freq: Once | INTRAMUSCULAR | Status: AC
  Administered 2018-09-09: 60 mg via INTRAMUSCULAR

## 2018-09-09 NOTE — Progress Notes (Signed)
Subjective:    Patient ID: Sherry Daniels, female    DOB: [birth year], 57 y.o.   MRN: [id number]  HPI 05/27/18 I reviewed the patient's recent office visit with my partner.  Her mother died around [facility].  She been battling dementia for many years.  Patient took approximately 1 week away from work and then returned to work.  However she states that her grief has gradually worsened over the last several weeks and now the patient no longer feels comfortable at work.  She states that she has poor concentration.  She feels extremely sad all the time.  She is unable to sleep.  She has no appetite.  She also has frequent panic attacks.  For all these reasons, she does not feel that she is on her "a game".  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  She is afraid she may make a mistake that may cause future legal issues or lawsuits.  Therefore she is requesting a leave of absence from her current position to allow her time to grieve.  She was seen last week by a therapist.  However the majority of the visit was in intake and they have not yet started formal therapy.  At that time, my plan was: I believe the patient's symptoms are unusual for the typical grieving process.  In fact the poor concentration, decreased energy, poor appetite, insomnia all concerns me about possible underlying depression in addition to the normal grieving process.  I am also concerned because the patient seems to be worsening after 8 weeks rather than improving.  Therefore I have recommended treatment for depression with Lexapro 10 mg a day.  Patient previously had suicidal thoughts on Cymbalta and is very concerned about taking medication.  She is requesting a temporary leave of absence.  Allow 2 weeks and then reassess.    06/07/18 Patient seems to be doing worse.  She continues to endorse trouble sleeping.  She endorses trouble concentrating.  She has very little  energy.  She continues to have occasional anxiety attacks.  She continues to report anhedonia feeling sad.  It is hard for her to go to work.  Frequently she starts her day at 10:00 in the morning.  She is only able to work a few hours before she has to go home.  Often she finds her self not accomplishing much when she is at work due to inability to focus and easy distractibility.  She is meeting with a grief counselor.  She is interested in taking Klonopin which a friend takes to help her sleep.  At that time, my plan was: I recommended discontinuation of Xanax.  I explained to the patient the Klonopin and Xanax or equivalent although the Klonopin may provide more sustained long-lasting relief from anxiety.  Therefore she can take Klonopin 0.5 mg p.o. twice daily.  I would also start the patient on Lexapro 10 mg a day and recheck the patient in 4 weeks or sooner if symptoms are worsening.  Also her blood pressures been relatively low recently.  I recommended decreasing her dose from 2 pills a day to 1 pill a day and then rechecking in 1 month.  09/09/18 Patient presents today with a headache that started 2 days ago.  She has a history of migraines.  She takes Topamax as a preventative.  This is decreased the frequency and severity of her migraines.  Usually she can treat her migraines  at home with a combination of Benadryl, Claritin, and anti-inflammatories.  However this headache is unrelieved by her usual measures.  In the past she took Relpax however she has none of this at home to be taken.  She reports photophobia.  In fact she sitting in a darkened room right now as we speak.  She reports photophobia.  She reports a unilateral headache that is intense and severe.  In the past she is used a combination of Toradol and Phenergan with great relief.  She is requesting this again.  Second issue is she had to start hydrochlorothiazide about 2 to 3 weeks ago for high blood pressure.  She is currently taking 12.5  mg daily.  Her blood pressure at home was 150-160/80-90. Past Medical History:  Diagnosis Date  . Allergy   . Asthma   . Chest pain 11/2009   overnight hospitalization  . Constipation    Dr. Loreta Ave  . Fibromyalgia   . GERD (gastroesophageal reflux disease)   . Hyperlipidemia   . Hypertension   . Migraine   . Nonalcoholic fatty liver disease    Past Surgical History:  Procedure Laterality Date  . ABDOMINAL HYSTERECTOMY     total  . COLONOSCOPY  09/15/2009   Dr. Loreta Ave; normal  . WISDOM TOOTH EXTRACTION     Current Outpatient Medications on File Prior to Visit  Medication Sig Dispense Refill  . acetaminophen (TYLENOL) 500 MG tablet Take 1,500 mg by mouth every 6 (six) hours as needed. Reported on 05/01/2016    . aspirin 81 MG EC tablet 1 tablet po qOD (Patient taking differently: Take 81 mg by mouth daily. ) 30 tablet 12  . clonazePAM (KLONOPIN) 0.5 MG tablet Take 1 tablet (0.5 mg total) by mouth 2 (two) times daily as needed for anxiety. 60 tablet 0  . diclofenac sodium (VOLTAREN) 1 % GEL Apply 2 g topically 4 (four) times daily. (Patient taking differently: Apply 2 g topically 4 (four) times daily as needed (pain). ) 100 g 3  . escitalopram (LEXAPRO) 10 MG tablet TAKE 1 TABLET BY MOUTH EVERY DAY 90 tablet 1  . estradiol (VIVELLE-DOT) 0.1 MG/24HR patch APPLY 1 NEW PATCH TWICE WEEKLY  3  . hydrochlorothiazide (MICROZIDE) 12.5 MG capsule Take 1 capsule (12.5 mg total) by mouth daily. 30 capsule 0  . ibuprofen (ADVIL,MOTRIN) 600 MG tablet Take 1 tablet (600 mg total) by mouth every 6 (six) hours as needed. 30 tablet 0  . linaclotide (LINZESS) 72 MCG capsule Take 72 mcg by mouth daily before breakfast.    . Misc Natural Products (TART CHERRY ADVANCED PO) Take by mouth.    . Multiple Vitamins-Minerals (MULTIVITAMIN WITH MINERALS) tablet Take 1 tablet by mouth daily.    . Omega-3 1000 MG CAPS Take by mouth.    . promethazine (PHENERGAN) 12.5 MG tablet Take 1 tablet (12.5 mg total) by mouth  every 8 (eight) hours as needed for nausea or vomiting. 20 tablet 0  . topiramate (TOPAMAX) 25 MG tablet TAKE 2 TABLETS (50 MG TOTAL) BY MOUTH 2 (TWO) TIMES DAILY. 120 tablet 3  . TURMERIC PO Take by mouth.     No current facility-administered medications on file prior to visit.    Allergies  Allergen Reactions  . Aspirin Nausea Only  . Codeine Nausea Only  . Cymbalta [Duloxetine Hcl]     Felt like it worsened anxiety and fibromyalgia  . Simvastatin Other (See Comments)    dizziness   Social History  Socioeconomic History  . Marital status: Married    Spouse name: Not on file  . Number of children: Not on file  . Years of education: Not on file  . Highest education level: Not on file  Occupational History  . 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 Financial resource strain: Not on file  . Food insecurity:    Worry: Not on file    Inability: Not on file  . Transportation needs:    Medical: Not on file    Non-medical: Not on file  Tobacco Use  . Smoking status: Never Smoker  . Smokeless tobacco: Never Used  Substance and Sexual Activity  . Alcohol use: No    Alcohol/week: 0.0 standard drinks  . Drug use: No  . Sexual activity: Yes    Birth control/protection: Surgical  Lifestyle  . Physical activity:    Days per week: Not on file    Minutes per session: Not on file  . Stress: Not on file  Relationships  . Social connections:    Talks on phone: Not on file    Gets together: Not on file    Attends religious service: Not on file    Active member of club or organization: Not on file    Attends meetings of clubs or organizations: Not on file    Relationship status: Not on file  . Intimate partner violence:    Fear of current or ex partner: Not on file    Emotionally abused: Not on file    Physically abused: Not on file    Forced sexual activity: Not on file  Other Topics Concern  . Not on file    Social History Narrative   Married, 1 daughter and 1 son, pentecostal, exercise most days, walking, biking, running.    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  All other systems reviewed and are negative.      Objective:   Physical Exam  Constitutional: She is oriented to person, place, and time. She appears well-developed and well-nourished. No distress.  Cardiovascular: Normal rate, regular rhythm and normal heart sounds.  No murmur heard. Pulmonary/Chest: Effort normal and breath sounds normal. No respiratory distress. She has no wheezes. She has no rales.  Neurological: She is alert and oriented to person, place, and time. No cranial nerve deficit. She exhibits normal muscle tone. Coordination normal.  Skin: She is not diaphoretic.  Vitals reviewed.         Assessment & Plan:  Essential hypertension, benign  Intractable chronic migraine without aura and with status migrainosus Regarding her blood pressure, I would add amlodipine 5 mg a day to hydrochlorothiazide 12.5 mg a day and recheck blood pressure in 2 to 3 weeks.  Regarding her migraines, continue Topamax for prevention.  Today abort status migrainosus with Toradol 60 mg IM x1 and Phenergan 25 mg IM x1.  Her husband will come drive her home for her own safety.  I gave her a prescription for Relpax to have at home in case she gets migraines in the future.  I explained to the patient is best to take this medication at the first start of the migraine in an attempt to abort the migraine rather than let it build to such a severe level.

## 2018-09-09 NOTE — Addendum Note (Signed)
Addended by: Phillips Odor on: 09/09/2018 09:52 AM   Modules accepted: Orders

## 2018-09-13 ENCOUNTER — Other Ambulatory Visit: Payer: Self-pay | Admitting: Physician Assistant

## 2018-09-13 DIAGNOSIS — I10 Essential (primary) hypertension: Secondary | ICD-10-CM

## 2018-09-24 ENCOUNTER — Ambulatory Visit: Payer: BC Managed Care – PPO | Admitting: Family Medicine

## 2018-09-25 ENCOUNTER — Encounter: Payer: Self-pay | Admitting: Family Medicine

## 2018-09-30 ENCOUNTER — Other Ambulatory Visit: Payer: Self-pay | Admitting: Obstetrics & Gynecology

## 2018-09-30 DIAGNOSIS — N63 Unspecified lump in unspecified breast: Secondary | ICD-10-CM

## 2018-10-03 ENCOUNTER — Ambulatory Visit
Admission: RE | Admit: 2018-10-03 | Discharge: 2018-10-03 | Disposition: A | Discharge status: Home / Self Care | Payer: BC Managed Care – PPO | Source: Ambulatory Visit | Attending: Obstetrics & Gynecology | Admitting: Obstetrics & Gynecology

## 2018-10-03 ENCOUNTER — Other Ambulatory Visit: Payer: Self-pay | Admitting: Obstetrics & Gynecology

## 2018-10-03 DIAGNOSIS — N644 Mastodynia: Secondary | ICD-10-CM

## 2018-10-03 DIAGNOSIS — N63 Unspecified lump in unspecified breast: Secondary | ICD-10-CM

## 2018-10-03 DIAGNOSIS — N6320 Unspecified lump in the left breast, unspecified quadrant: Secondary | ICD-10-CM

## 2018-10-04 ENCOUNTER — Other Ambulatory Visit: Payer: BC Managed Care – PPO

## 2018-11-20 ENCOUNTER — Other Ambulatory Visit: Payer: Self-pay | Admitting: Family Medicine

## 2018-11-20 DIAGNOSIS — I10 Essential (primary) hypertension: Secondary | ICD-10-CM

## 2018-12-19 ENCOUNTER — Other Ambulatory Visit: Payer: Self-pay | Admitting: Family Medicine

## 2018-12-19 DIAGNOSIS — I10 Essential (primary) hypertension: Secondary | ICD-10-CM

## 2019-01-02 ENCOUNTER — Other Ambulatory Visit: Payer: Self-pay | Admitting: Family Medicine

## 2019-01-02 DIAGNOSIS — I10 Essential (primary) hypertension: Secondary | ICD-10-CM

## 2019-01-22 ENCOUNTER — Other Ambulatory Visit: Payer: Self-pay | Admitting: *Deleted

## 2019-01-22 DIAGNOSIS — I10 Essential (primary) hypertension: Secondary | ICD-10-CM

## 2019-01-22 MED ORDER — HYDROCHLOROTHIAZIDE 12.5 MG PO CAPS: ORAL_CAPSULE | ORAL | 1 refills | Status: DC

## 2019-02-14 ENCOUNTER — Ambulatory Visit (INDEPENDENT_AMBULATORY_CARE_PROVIDER_SITE_OTHER): Payer: BC Managed Care – PPO | Admitting: Family Medicine

## 2019-02-14 ENCOUNTER — Other Ambulatory Visit: Payer: Self-pay

## 2019-02-14 DIAGNOSIS — B9689 Other specified bacterial agents as the cause of diseases classified elsewhere: Secondary | ICD-10-CM

## 2019-02-14 DIAGNOSIS — B349 Viral infection, unspecified: Secondary | ICD-10-CM

## 2019-02-14 DIAGNOSIS — J019 Acute sinusitis, unspecified: Secondary | ICD-10-CM | POA: Diagnosis not present

## 2019-02-14 MED ORDER — AMOXICILLIN-POT CLAVULANATE 875-125 MG PO TABS: 1.0000 | ORAL_TABLET | Freq: Two times a day (BID) | ORAL | 0 refills | Status: DC

## 2019-02-14 MED ORDER — BUTALBITAL-APAP-CAFFEINE 50-325-40 MG PO TABS: 1.0000 | ORAL_TABLET | Freq: Four times a day (QID) | ORAL | 0 refills | Status: AC | PRN

## 2019-02-14 NOTE — Progress Notes (Signed)
Subjective:    Patient ID: Sherry Daniels, female    DOB: 11-26-1960, 58 y.o.   MRN: [id number]  HPI Patient is being seen today by phone visit.  Patient is at home.  I am in my office.  Patient consents to be seen over the telephone.  Patient states that 3 days ago, she developed a headache.  The headache is located in the front of her head over her forehead and the frontal sinus area.  However the patient states that it is a deeper more serious headache than she has experienced with previous sinus infections.  It is constant.  It has been unrelieved by Phenergan or Relpax.  She does have a history of migraines.  The same time she developed subjective fevers and chills.  Her temperature has been as high as 99.8.  She reports diffuse myalgias and arthralgias.  She also has a sore throat.  She also reports some mild shortness of breath but denies any cough.  She denies any purulent sputum.  She denies any hemoptysis.  She denies any pleurisy.  She does have some mild right-sided rib tenderness.  She also has some mild nausea.  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 Past Medical History:  Diagnosis Date   Allergy    Asthma    Chest pain 11/2009   overnight hospitalization   Constipation    Dr. Loreta Ave   Fibromyalgia    GERD (gastroesophageal reflux disease)    Hyperlipidemia    Hypertension    Migraine    Nonalcoholic fatty liver disease    Past Surgical History:  Procedure Laterality Date   ABDOMINAL HYSTERECTOMY     total   COLONOSCOPY  09/15/2009   Dr. Loreta Ave; normal   WISDOM TOOTH EXTRACTION     Current Outpatient Medications on File Prior to Visit  Medication Sig Dispense Refill   acetaminophen (TYLENOL) 500 MG tablet Take 1,500 mg by mouth every 6 (six) hours as needed. Reported on 05/01/2016     amLODipine (NORVASC) 5 MG tablet Take 1 tablet (5 mg total) by  mouth daily. 90 tablet 3   aspirin 81 MG EC tablet 1 tablet po qOD (Patient taking differently: Take 81 mg by mouth daily. ) 30 tablet 12   clonazePAM (KLONOPIN) 0.5 MG tablet Take 1 tablet (0.5 mg total) by mouth 2 (two) times daily as needed for anxiety. 60 tablet 0   diclofenac sodium (VOLTAREN) 1 % GEL Apply 2 g topically 4 (four) times daily. (Patient taking differently: Apply 2 g topically 4 (four) times daily as needed (pain). ) 100 g 3   eletriptan (RELPAX) 40 MG tablet TAKE 1 TAB AT ONSET OF HEADACHE.MAY REPEAT IN 2HRS IF HEADACHE PERSISTS 10 tablet 2   escitalopram (LEXAPRO) 10 MG tablet TAKE 1 TABLET BY MOUTH EVERY DAY 90 tablet 1   estradiol (VIVELLE-DOT) 0.1 MG/24HR patch APPLY 1 NEW PATCH TWICE WEEKLY  3   hydrochlorothiazide (MICROZIDE) 12.5 MG capsule TAKE 1 CAPSULE BY MOUTH EVERY DAY 90 capsule 1   ibuprofen (ADVIL,MOTRIN) 600 MG tablet Take 1 tablet (600 mg total) by mouth every 6 (six) hours as needed. 30 tablet 0   linaclotide (LINZESS) 72 MCG capsule Take 72 mcg by mouth daily before breakfast.     Misc Natural Products (TART CHERRY ADVANCED PO) Take by mouth.     Multiple Vitamins-Minerals (MULTIVITAMIN WITH MINERALS) tablet  Take 1 tablet by mouth daily.     Omega-3 1000 MG CAPS Take by mouth.     promethazine (PHENERGAN) 12.5 MG tablet Take 1 tablet (12.5 mg total) by mouth every 8 (eight) hours as needed for nausea or vomiting. 20 tablet 0   topiramate (TOPAMAX) 25 MG tablet TAKE 2 TABLETS (50 MG TOTAL) BY MOUTH 2 (TWO) TIMES DAILY. 120 tablet 3   TURMERIC PO Take by mouth.     No current facility-administered medications on file prior to visit.    Allergies  Allergen Reactions   Aspirin Nausea Only   Codeine Nausea Only   Cymbalta [Duloxetine Hcl]     Felt like it worsened anxiety and fibromyalgia   Simvastatin Other (See Comments)    dizziness   Social History   Socioeconomic History   Marital status: Married    Spouse name: Not on file     Number of children: Not on file   Years of education: Not on file   Highest education level: Not on file  Occupational History   Occupation: supervise principles in school system    Employer: [employer]  Social Needs   Financial resource strain: Not on file   Food insecurity:    Worry: Not on file    Inability: Not on file   Transportation needs:    Medical: Not on file    Non-medical: Not on file  Tobacco Use   Smoking status: Never Smoker   Smokeless tobacco: Never Used  Substance and Sexual Activity   Alcohol use: No    Alcohol/week: 0.0 standard drinks   Drug use: No   Sexual activity: Yes    Birth control/protection: Surgical  Lifestyle   Physical activity:    Days per week: Not on file    Minutes per session: Not on file   Stress: Not on file  Relationships   Social connections:    Talks on phone: Not on file    Gets together: Not on file    Attends religious service: Not on file    Active member of club or organization: Not on file    Attends meetings of clubs or organizations: Not on file    Relationship status: Not on file   Intimate partner violence:    Fear of current or ex partner: Not on file    Emotionally abused: Not on file    Physically abused: Not on file    Forced sexual activity: Not on file  Other Topics Concern   Not on file  Social History Narrative   Married, 1 daughter and 1 son, pentecostal, exercise most days, walking, biking, running.    Librarian, academicxecutive Director for [employer]      Review of Systems  All other systems reviewed and are negative.      Objective:   Physical Exam  Physical exam could not be performed as this was a phone visit.  However the patient was talking in full and complete sentences without any labored breathing or respiratory distress.  She sounded comfortable and in no distress.  She was very coherent and was not delirious.     Assessment & Plan:  Acute bacterial  rhinosinusitis - Plan: amoxicillin-clavulanate (AUGMENTIN) 875-125 MG tablet  Acute viral syndrome  Differential diagnosis is either a sinus infection or a viral syndrome.  I am leaning more towards a viral upper respiratory syndrome similar to the flu.  She has low-grade fevers, she has headaches, she has  myalgias and arthralgias.  Given the current coronavirus pandemic, I am very concerned that the patient may have the coronavirus.  However the present time she does not meet clinical criteria for hospital admission.  She has no significant shortness of breath or respiratory distress.  Therefore in accordance with the recommendations of the health department I have recommended against going for screening at the hospital.  I have recommended that the patient strictly quarantine herself to home for the next 14 days.  If she develops worsening shortness of breath or labored breathing, she is to go to the emergency room immediately through the triage location for coronavirus patients.  Patient understands this.  We will treat her headache with Fioricet 1 tablet every 4-6 hours as needed for headache.  She can use ibuprofen 800 mg every 8 hours for fevers and myalgias and arthralgias.  I will prescribe Augmentin 875 mg twice daily on the chance that this could be a sinus infection that is exacerbated by her underlying history of migraines and fibromyalgia however my leading suspicion is that the patient has some type of virus and given the current situation I am concerned about the coronavirus.  We also discussed precautions that the patient should take at home to avoid exposing her family such as wearing a mask and quarantine herself to a separate room as well as a separate bathroom and cleaning all the surfaces in her home with Clorox wipes.  Call concluded at [phone]

## 2019-02-18 ENCOUNTER — Telehealth: Payer: Self-pay | Admitting: Family Medicine

## 2019-02-18 NOTE — Telephone Encounter (Signed)
Tried to call but number would not ring out

## 2019-02-18 NOTE — Telephone Encounter (Signed)
[phone] Patient calling to see if she can speak with you regarding getting a breathing regiment added to what is being called in for her  [facility]

## 2019-02-20 ENCOUNTER — Telehealth: Payer: BC Managed Care – PPO | Admitting: Physician Assistant

## 2019-02-20 DIAGNOSIS — R0602 Shortness of breath: Secondary | ICD-10-CM

## 2019-02-20 NOTE — Progress Notes (Signed)
Based on what you shared with me, I feel your condition warrants further evaluation and I recommend that you be seen for a face to face office visit.     NOTE: If you entered your credit card information for this eVisit, you will not be charged. You may see a "hold" on your card for the $35 but that hold will drop off and you will not have a charge processed.  If you are having a true medical emergency please call 911.  If you need an urgent face to face visit, [facility] has four urgent care centers for your convenience.    PLEASE NOTE: THE [facility] LOCATIONS AND URGENT CARE CLINICS DO NOT HAVE THE TESTING FOR CORONAVIRUS COVID19 AVAILABLE.  IF YOU FEEL YOU NEED THIS TEST YOU MUST GO TO A TRIAGE LOCATION AT ONE OF THE HOSPITAL EMERGENCY DEPARTMENTS   [email or web address] to reserve your spot online an avoid wait times  [facility] [address], [address] [place], [place] [address] Modified hours of operation: Monday-Friday, 10 AM to 6 PM  Saturday & Sunday 10 AM to 4 PM *Across the street from [employer]  [facility] (New Address!) [address], [address] [place], [place] [address] *Just off [address], across the road from [employer]* Modified hours of operation: Monday-Friday, 10 AM to 5 PM  Closed Saturday & Sunday   The following sites will take your insurance:  . [facility]  [phone] Get Driving Directions Find a Provider at this Location  [address] [place], [place] [address] . 10 am to 8 pm Monday-Friday . 12 pm to 8 pm Saturday-Sunday   . [facility]  [phone] Get Driving Directions Find a Provider at this Location  [address], [address] [place], [place] [address] . 8 am to 8 pm Monday-Friday . 9 am to 6 pm Saturday . 11 am to 6 pm Sunday   . [facility] [place]  [phone] Get Driving Directions  [address] [place], [place] [address] . 8 am to 8 pm Monday-Friday . 8 am to 4 pm Saturday-Sunday   Your e-visit answers were reviewed by a board certified advanced clinical practitioner to complete your personal care plan.  Thank you for using e-Visits. 

## 2019-02-21 ENCOUNTER — Telehealth: Payer: Self-pay | Admitting: Family Medicine

## 2019-02-21 NOTE — Telephone Encounter (Signed)
Pt called had been seen in the office last week with respiratory symptoms, headache, sinusitis issues States her headaches improved, using fioricet as needed but trying to back down,  things have improved overall but still feels chest tightness and a little SOB, in the past has been on albuterol nebs and this helped, has mild asthma, no current inhaler. No current fever Able to speak in full sentences  Discussed neb not recommended as it will spread Covid 19 more  can give albuterol inhaler with prednisone burst Use tylenol for headache  go to ER if she does not improve with above treatment  continue to quarantine

## 2019-02-24 NOTE — Telephone Encounter (Signed)
See 02/21/19 Telephone note - this was addressed by Dr. Jeanice Lim

## 2019-03-06 ENCOUNTER — Other Ambulatory Visit: Payer: Self-pay | Admitting: Family Medicine

## 2019-05-19 ENCOUNTER — Other Ambulatory Visit: Payer: Self-pay | Admitting: *Deleted

## 2019-05-19 MED ORDER — CLONAZEPAM 0.5 MG PO TABS: 0.5000 mg | ORAL_TABLET | Freq: Two times a day (BID) | ORAL | 0 refills | Status: DC | PRN

## 2019-05-19 NOTE — Telephone Encounter (Signed)
Received fax requesting refill on Klonopin.   Ok to refill??  Last office visit 02/14/2019.  Last refill 06/08/2019.

## 2019-06-28 ENCOUNTER — Other Ambulatory Visit: Payer: Self-pay | Admitting: Family Medicine

## 2019-06-30 NOTE — Telephone Encounter (Signed)
Ok to refill??  Last office visit 02/14/2019  Last refill 05/19/2019

## 2019-07-25 ENCOUNTER — Telehealth: Payer: Self-pay | Admitting: Family Medicine

## 2019-07-25 ENCOUNTER — Other Ambulatory Visit: Payer: Self-pay | Admitting: Family Medicine

## 2019-07-25 MED ORDER — ALPRAZOLAM 0.5 MG PO TABS: 0.5000 mg | ORAL_TABLET | Freq: Three times a day (TID) | ORAL | 0 refills | Status: DC | PRN

## 2019-07-25 NOTE — Telephone Encounter (Signed)
Pt called requesting a refill of Xanax. She states that her stress level has increased and you had changed her to Klonopin d/t xanax making her too sleepy but she is at home now and was wondering if you would refill her xanax instead???

## 2019-07-25 NOTE — Telephone Encounter (Signed)
I will refill xanax.

## 2019-08-15 ENCOUNTER — Other Ambulatory Visit: Payer: Self-pay | Admitting: *Deleted

## 2019-08-15 DIAGNOSIS — Z20822 Contact with and (suspected) exposure to covid-19: Secondary | ICD-10-CM

## 2019-08-16 LAB — NOVEL CORONAVIRUS, NAA: SARS-CoV-2, NAA: NOT DETECTED

## 2019-08-20 ENCOUNTER — Other Ambulatory Visit: Payer: Self-pay | Admitting: Family Medicine

## 2019-08-20 NOTE — Telephone Encounter (Signed)
Ok to refill??  Last office visit 03/13/2019.  Last refill 07/25/2019.

## 2019-10-02 ENCOUNTER — Other Ambulatory Visit: Payer: Self-pay | Admitting: Family Medicine

## 2019-10-03 NOTE — Telephone Encounter (Signed)
Ok to refill??  Last office visit 02/14/2019.  Last refill 08/21/2019.

## 2019-11-29 ENCOUNTER — Other Ambulatory Visit: Payer: Self-pay | Admitting: Family Medicine

## 2019-12-01 NOTE — Telephone Encounter (Signed)
Ok to refill??  Last office visit 02/14/2019.  Last refill 06/30/2019. Of note, medication is no longer on list.

## 2020-04-01 ENCOUNTER — Other Ambulatory Visit: Payer: Self-pay | Admitting: Family Medicine

## 2020-04-01 DIAGNOSIS — I10 Essential (primary) hypertension: Secondary | ICD-10-CM

## 2020-05-15 ENCOUNTER — Other Ambulatory Visit: Payer: Self-pay | Admitting: Family Medicine

## 2020-05-19 NOTE — Telephone Encounter (Signed)
Ok to refill??  Last office visit 02/21/2019.  Last refill 10/03/2019.  Of note, patient has appointment on 05/24/2020.

## 2020-05-20 NOTE — Progress Notes (Signed)
Office Visit Note  Patient: Sherry Daniels             Date of Birth: 06/30/1961           MRN: [id number]             PCP: Donita Brooks, MD Referring: Donita Brooks, MD Visit Date: 06/03/2020 Occupation: @GUAROCC @  Subjective:  Fatigue  History of Present Illness: Kayanna Mckillop is a 59 y.o. female with history of polyarthralgia and fibromyalgia.  Patient presents today with several new concerns.  She states that about once a month she experiences a flare where she experiences significant fatigue, rashes, myalgias, arthralgias, oral ulcerations, and sicca symptoms.  She states that the fatigue is profound and she is bedridden during these episodes.  She continues to have ongoing photosensitivity and tries to avoid direct sun exposure.  She has not had any symptoms of Raynaud's recently she is currently having pain in both hands and both elbow joints but denies any joint swelling.  She has ongoing trochanter bursitis bilaterally.     Activities of Daily Living:  Patient reports morning stiffness for 30-40 minutes.   Patient Reports nocturnal pain.  Difficulty dressing/grooming: Denies Difficulty climbing stairs: Reports Difficulty getting out of chair: Reports Difficulty using hands for taps, buttons, cutlery, and/or writing: Denies  Review of Systems  Constitutional: Positive for fatigue.  HENT: Positive for mouth sores and mouth dryness. Negative for nose dryness.   Eyes: Negative for itching and dryness.  Respiratory: Negative for shortness of breath and difficulty breathing.   Cardiovascular: Negative for chest pain and palpitations.  Gastrointestinal: Negative for blood in stool, constipation and diarrhea.  Endocrine: Negative for increased urination.  Genitourinary: Negative for difficulty urinating.  Musculoskeletal: Positive for arthralgias, joint pain, myalgias, morning stiffness, muscle tenderness and myalgias. Negative for joint swelling.    Skin: Positive for rash. Negative for color change.  Allergic/Immunologic: Negative for susceptible to infections.  Neurological: Positive for dizziness, numbness and headaches. Negative for memory loss and weakness.  Hematological: Positive for bruising/bleeding tendency.  Psychiatric/Behavioral: Negative for confusion.    PMFS History:  Patient Active Problem List   Diagnosis Date Noted  . Sicca complex (HCC) 02/18/2018  . History of migraine 02/18/2018  . History of pleurisy 02/18/2018  . 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 Migraines 01/29/2017  . Anemia 11/24/2011  . Headache(784.0) 11/24/2011  . Nonalcoholic fatty liver disease 11/08/2011  . Essential hypertension, benign 10/12/2011  . Hyperlipidemia 10/12/2011  . GERD (gastroesophageal reflux disease) 10/12/2011  . Fibromyalgia 10/12/2011  . Polyarthralgia 10/12/2011  . Allergy     Past Medical History:  Diagnosis Date  . Allergy   . Asthma   . Chest pain 11/2009   overnight hospitalization  . Constipation    Dr. 12/2009  . Fibromyalgia   . GERD (gastroesophageal reflux disease)   . Hyperlipidemia   . Hypertension   . Migraine   . Nonalcoholic fatty liver disease     Family History  Problem Relation Age of Onset  . Cancer Mother        stage III, colon  . Hypertension Mother   . Dementia Mother   . Heart disease Mother        bradycardia  . Cancer Father        prostate  . Hypertension Father   . Hyperlipidemia Father   . Heart disease Father 54       MI   . Hyperlipidemia Sister   .  Hypertension Sister   . Fibromyalgia Sister   . Arthritis Brother        RA  . Hypertension Brother   . Hyperlipidemia Brother   . Fibromyalgia Brother   . Hypertension Brother   . Hyperlipidemia Brother   . Healthy Daughter   . Healthy Son   . Lupus Niece    Past Surgical History:  Procedure Laterality Date  . ABDOMINAL HYSTERECTOMY     total  . COLONOSCOPY  09/15/2009   Dr. Loreta Ave; normal  . WISDOM TOOTH  EXTRACTION     Social History   Social History Narrative   Married, 1 daughter and 1 son, pentecostal, exercise most days, walking, biking, running.    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   Objective: Vital Signs: BP 119/75 (BP Location: Right Arm, Patient Position: Sitting, Cuff Size: Normal)   Pulse 73   Resp 13   Ht 5\' 6"  (1.676 m)   Wt 163 lb (73.9 kg)   BMI 26.31 kg/m    Physical Exam Vitals and nursing note reviewed.  Constitutional:      Appearance: She is well-developed.  HENT:     Head: Normocephalic and atraumatic.  Eyes:     Conjunctiva/sclera: Conjunctivae normal.  Pulmonary:     Effort: Pulmonary effort is normal.  Abdominal:     General: Bowel sounds are normal.     Palpations: Abdomen is soft.  Musculoskeletal:     Cervical back: Normal range of motion.  Lymphadenopathy:     Cervical: No cervical adenopathy.  Skin:    General: Skin is warm and dry.     Capillary Refill: Capillary refill takes less than 2 seconds.  Neurological:     Mental Status: She is alert and oriented to person, place, and time.  Psychiatric:        Behavior: Behavior normal.      Musculoskeletal Exam: Generalized hyperalgesia and positive tender points C-spine has good range of motion with discomfort.  Thoracic and lumbar spine good range of motion.  Shoulder joints, breast, wrist joints, MCPs, PIPs and DIPs have good range of motion with no synovitis.  She has complete fist formation bilaterally.  Hip joints have good range of motion with discomfort bilaterally.  She has tenderness over bilateral trochanteric bursa.  Knee joints have good range of motion with no warmth or effusion.  Ankle joints have good range of motion with no tenderness or inflammation.  CDAI Exam: CDAI Score: -- Patient Global: --; Provider Global: -- Swollen: --; Tender: -- Joint Exam 06/03/2020   No joint exam has been documented for  this visit   There is currently no information documented on the homunculus. Go to the Rheumatology activity and complete the homunculus joint exam.  Investigation: No additional findings.  Imaging: No results found.  Recent Labs: Lab Results  Component Value Date   WBC 4.3 05/24/2020   HGB 13.1 05/24/2020   PLT 122 (L) 05/24/2020   NA 139 05/24/2020   K 3.8 05/24/2020   CL 101 05/24/2020   CO2 28 05/24/2020   GLUCOSE 98 05/24/2020   BUN 15 05/24/2020   CREATININE 0.72 05/24/2020   BILITOT 0.4 05/24/2020   ALKPHOS 66 03/29/2017   AST 41 (H) 05/24/2020   ALT 81 (H) 05/24/2020   PROT 7.6 05/24/2020   ALBUMIN 3.6 03/29/2017   CALCIUM 9.6 05/24/2020   GFRAA 108 05/24/2020  Speciality Comments: No specialty comments available.  Procedures:  No procedures performed Allergies: Aspirin, Codeine, Cymbalta [duloxetine hcl], and Simvastatin   Assessment / Plan:     Visit Diagnoses: Polyarthralgia - All autoimmune workup negative except RNP 1.0 which was borderline positive.  She presents today with increased arthralgias and myalgias.  No synovitis was noted on exam.  She has been experiencing flares about once a month where she experiences profound fatigue, arthralgias, myalgias, sicca symptoms, oral ulcerations, and facial rashes.  She continues to have ongoing photosensitivity and tries to avoid direct sun exposure.  She has not experienced any symptoms of Raynaud's.  No cervical lymphadenopathy was palpable today.  She is concerned about the severity and frequency of these flares.  Lab work from 01/09/2018 was reviewed with the patient today in the office.  RNP was 1.0 which is borderline positive.  We discussed that due to the severity of her symptoms and +RNP we will obtain [facility] labs.  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  [facility] lab ordered provided to the patient.   Sicca complex (HCC) - ANA, SSA, SSB antibodies  were all negative.  She has chronic sicca symptoms.  Her mouth dryness has been severe. [facility] labs will be obtained.   Chondromalacia of both patellae: She has good range of motion of both knee joints on exam.  No warmth or effusion was noted.  She has occasional discomfort and stiffness in both knees but tries to remain active and exercises on a regular basis.  Fibromyalgia: She has generalized hyperalgesia and positive tender points on exam.  She has been having a fibromyalgia flare about once a month which has been severe.  During these episodes her fatigue is profound.  She has been experiencing increased generalized pain and occasional nocturnal pain.  She has been trying to stay active and has been exercising on a regular basis.  Other medical conditions are listed as follows:   Essential hypertension  Nonalcoholic fatty liver disease  Gastroesophageal reflux disease without esophagitis  History of migraine  History of pleurisy - 2013  Pure hypercholesterolemia  Orders: No orders of the defined types were placed in this encounter.  No orders of the defined types were placed in this encounter.     Follow-Up Instructions: Return in about 4 weeks (around 07/01/2020) for Fibromyalgia.   Pollyann Savoy, MD   Scribed by-  Sherron Ales PA-C  Note - This record has been created using Dragon software.  Chart creation errors have been sought, but may not always  have been located. Such creation errors do not reflect on  the standard of medical care.

## 2020-05-24 ENCOUNTER — Ambulatory Visit: Payer: BC Managed Care – PPO | Admitting: Family Medicine

## 2020-05-24 ENCOUNTER — Other Ambulatory Visit: Payer: Self-pay

## 2020-05-24 VITALS — BP 110/80 | HR 78 | Temp 97.9°F | Ht 66.0 in | Wt 163.0 lb

## 2020-05-24 DIAGNOSIS — I10 Essential (primary) hypertension: Secondary | ICD-10-CM | POA: Diagnosis not present

## 2020-05-24 DIAGNOSIS — Z8659 Personal history of other mental and behavioral disorders: Secondary | ICD-10-CM | POA: Diagnosis not present

## 2020-05-24 DIAGNOSIS — Z8669 Personal history of other diseases of the nervous system and sense organs: Secondary | ICD-10-CM | POA: Diagnosis not present

## 2020-05-24 DIAGNOSIS — E7800 Pure hypercholesterolemia, unspecified: Secondary | ICD-10-CM

## 2020-05-24 MED ORDER — ALBUTEROL SULFATE HFA 108 (90 BASE) MCG/ACT IN AERS: INHALATION_SPRAY | RESPIRATORY_TRACT | 2 refills | Status: AC

## 2020-05-24 MED ORDER — ESCITALOPRAM OXALATE 10 MG PO TABS: 10.0000 mg | ORAL_TABLET | Freq: Every day | ORAL | 3 refills | Status: DC

## 2020-05-24 MED ORDER — ALPRAZOLAM 0.5 MG PO TABS: 0.5000 mg | ORAL_TABLET | Freq: Three times a day (TID) | ORAL | 0 refills | Status: DC | PRN

## 2020-05-24 NOTE — Addendum Note (Signed)
Addended by: Lynnea Ferrier T on: 05/24/2020 09:41 AM   Modules accepted: Orders

## 2020-05-24 NOTE — Progress Notes (Signed)
Subjective:    Patient ID: Sherry Daniels, female    DOB: 12-Aug-1961, 59 y.o.   MRN: [id number]   05/27/18 I reviewed the patient's recent office visit with my partner.  Her mother died around [facility] Day.  She been battling dementia for many years.  Patient took approximately 1 week away from work and then returned to work.  However she states that her grief has gradually worsened over the last several weeks and now the patient no longer feels comfortable at work.  She states that she has poor concentration.  She feels extremely sad all the time.  She is unable to sleep.  She has no appetite.  She also has frequent panic attacks.  For all these reasons, she does not feel that she is on her "a game".  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  She is afraid she may make a mistake that may cause future legal issues or lawsuits.  Therefore she is requesting a leave of absence from her current position to allow her time to grieve.  She was seen last week by a therapist.  However the majority of the visit was in intake and they have not yet started formal therapy.  At that time, my plan was: I believe the patient's symptoms are unusual for the typical grieving process.  In fact the poor concentration, decreased energy, poor appetite, insomnia all concerns me about possible underlying depression in addition to the normal grieving process.  I am also concerned because the patient seems to be worsening after 8 weeks rather than improving.  Therefore I have recommended treatment for depression with Lexapro 10 mg a day.  Patient previously had suicidal thoughts on Cymbalta and is very concerned about taking medication.  She is requesting a temporary leave of absence.  Allow 2 weeks and then reassess.    06/07/18 Patient seems to be doing worse.  She continues to endorse trouble sleeping.  She endorses trouble concentrating.  She has very little  energy.  She continues to have occasional anxiety attacks.  She continues to report anhedonia feeling sad.  It is hard for her to go to work.  Frequently she starts her day at 10:00 in the morning.  She is only able to work a few hours before she has to go home.  Often she finds her self not accomplishing much when she is at work due to inability to focus and easy distractibility.  She is meeting with a grief counselor.  She is interested in taking Klonopin which a friend takes to help her sleep.  At that time, my plan was: I recommended discontinuation of Xanax.  I explained to the patient the Klonopin and Xanax or equivalent although the Klonopin may provide more sustained long-lasting relief from anxiety.  Therefore she can take Klonopin 0.5 mg p.o. twice daily.  I would also start the patient on Lexapro 10 mg a day and recheck the patient in 4 weeks or sooner if symptoms are worsening.  Also her blood pressures been relatively low recently.  I recommended decreasing her dose from 2 pills a day to 1 pill a day and then rechecking in 1 month.  09/09/18 Patient presents today with a headache that started 2 days ago.  She has a history of migraines.  She takes Topamax as a preventative.  This is decreased the frequency and severity of her migraines.  Usually she can treat her migraines  at home with a combination of Benadryl, Claritin, and anti-inflammatories.  However this headache is unrelieved by her usual measures.  In the past she took Relpax however she has none of this at home to be taken.  She reports photophobia.  In fact she sitting in a darkened room right now as we speak.  She reports photophobia.  She reports a unilateral headache that is intense and severe.  In the past she is used a combination of Toradol and Phenergan with great relief.  She is requesting this again.  Second issue is she had to start hydrochlorothiazide about 2 to 3 weeks ago for high blood pressure.  She is currently taking 12.5  mg daily.  Her blood pressure at home was 150-160/80-90.  At that time, my plan was: Regarding her blood pressure, I would add amlodipine 5 mg a day to hydrochlorothiazide 12.5 mg a day and recheck blood pressure in 2 to 3 weeks.  Regarding her migraines, continue Topamax for prevention.  Today abort status migrainosus with Toradol 60 mg IM x1 and Phenergan 25 mg IM x1.  Her husband will come drive her home for her own safety.  I gave her a prescription for Relpax to have at home in case she gets migraines in the future.  I explained to the patient is best to take this medication at the first start of the migraine in an attempt to abort the migraine rather than let it build to such a severe level.   05/24/20 Since I last saw the patient, she has retired.  However she also recently lost her father.  Therefore she is still having to use the Xanax on a nightly basis to help her sleep.  She does not recollect why she stopped Lexapro.  Her sister takes Lexapro.  She does not recall any serious side effects.  She does not remember whether the medication helped her.  At the present time she is only been using Xanax on a daily basis to help her sleep and control the anxiety.  Her blood pressure today is outstanding at 110/80.  She is not taking Klonopin.  The medication list is incorrect.  She did recently see her gynecologist to perform nonfasting lab work.  Apparently her blood sugar was prediabetic and her LDL cholesterol was very high.  She would like to repeat that today to see what her fasting levels are.  She has recently started exercising and is back to running 3 miles a day.  She is also reduce the carbohydrates in her diet.  Diabetes does run in her family as her brother is a prediabetic.  She also has a history of exercise-induced asthma and would like a refill on her albuterol that she uses sparingly for the occasional attack Past Medical History:  Diagnosis Date  . Allergy   . Asthma   . Chest pain  11/2009   overnight hospitalization  . Constipation    Dr. Loreta Ave  . Fibromyalgia   . GERD (gastroesophageal reflux disease)   . Hyperlipidemia   . Hypertension   . Migraine   . Nonalcoholic fatty liver disease    Past Surgical History:  Procedure Laterality Date  . ABDOMINAL HYSTERECTOMY     total  . COLONOSCOPY  09/15/2009   Dr. Loreta Ave; normal  . WISDOM TOOTH EXTRACTION     Current Outpatient Medications on File Prior to Visit  Medication Sig Dispense Refill  . acetaminophen (TYLENOL) 500 MG tablet Take 1,500 mg by mouth every  6 (six) hours as needed. Reported on 05/01/2016    . albuterol (VENTOLIN HFA) 108 (90 Base) MCG/ACT inhaler USE 2 PUFFS EVERY 4 TO 6 HOURS AS NEEDED 8.5 Inhaler 0  . ALPRAZolam (XANAX) 0.5 MG tablet TAKE 1 TABLET (0.5 MG TOTAL) BY MOUTH 3 (THREE) TIMES DAILY AS NEEDED FOR ANXIETY. 30 tablet 0  . amLODipine (NORVASC) 5 MG tablet Take 1 tablet (5 mg total) by mouth daily. 90 tablet 3  . amoxicillin-clavulanate (AUGMENTIN) 875-125 MG tablet Take 1 tablet by mouth 2 (two) times daily. 20 tablet 0  . aspirin 81 MG EC tablet 1 tablet po qOD (Patient taking differently: Take 81 mg by mouth daily. ) 30 tablet 12  . clonazePAM (KLONOPIN) 0.5 MG tablet TAKE 1 TABLET (0.5 MG TOTAL) BY MOUTH 2 (TWO) TIMES DAILY AS NEEDED FOR ANXIETY. 60 tablet 0  . diclofenac sodium (VOLTAREN) 1 % GEL Apply 2 g topically 4 (four) times daily. (Patient taking differently: Apply 2 g topically 4 (four) times daily as needed (pain). ) 100 g 3  . eletriptan (RELPAX) 40 MG tablet TAKE 1 TAB AT ONSET OF HEADACHE.MAY REPEAT IN 2HRS IF HEADACHE PERSISTS 10 tablet 2  . escitalopram (LEXAPRO) 10 MG tablet TAKE 1 TABLET BY MOUTH EVERY DAY 90 tablet 1  . estradiol (VIVELLE-DOT) 0.1 MG/24HR patch APPLY 1 NEW PATCH TWICE WEEKLY  3  . hydrochlorothiazide (MICROZIDE) 12.5 MG capsule TAKE 1 CAPSULE BY MOUTH EVERY DAY 90 capsule 1  . ibuprofen (ADVIL,MOTRIN) 600 MG tablet Take 1 tablet (600 mg total) by mouth  every 6 (six) hours as needed. 30 tablet 0  . linaclotide (LINZESS) 72 MCG capsule Take 72 mcg by mouth daily before breakfast.    . Misc Natural Products (TART CHERRY ADVANCED PO) Take by mouth.    . Multiple Vitamins-Minerals (MULTIVITAMIN WITH MINERALS) tablet Take 1 tablet by mouth daily.    . Omega-3 1000 MG CAPS Take by mouth.    . promethazine (PHENERGAN) 12.5 MG tablet Take 1 tablet (12.5 mg total) by mouth every 8 (eight) hours as needed for nausea or vomiting. 20 tablet 0  . topiramate (TOPAMAX) 25 MG tablet TAKE 2 TABLETS (50 MG TOTAL) BY MOUTH 2 (TWO) TIMES DAILY. 120 tablet 3  . TURMERIC PO Take by mouth.     No current facility-administered medications on file prior to visit.   Allergies  Allergen Reactions  . Aspirin Nausea Only  . Codeine Nausea Only  . Cymbalta [Duloxetine Hcl]     Felt like it worsened anxiety and fibromyalgia  . Simvastatin Other (See Comments)    dizziness   Social History   Socioeconomic History  . Marital status: Married    Spouse name: Not on file  . Number of children: Not on file  . Years of education: Not on file  . Highest education level: Not on file  Occupational History  . 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 Smoking status: Never Smoker  . Smokeless tobacco: Never Used  Vaping Use  . Vaping Use: Never used  Substance and Sexual Activity  . Alcohol use: No    Alcohol/week: 0.0 standard drinks  . Drug use: No  . Sexual activity: Yes    Birth control/protection: Surgical  Other Topics Concern  . Not on file  Social History Narrative   Married, 1 daughter and 1 son, pentecostal, exercise most days, walking, biking, running.    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 Difficulty of Paying Living Expenses:   Food Insecurity:   . Worried About Programme researcher, broadcasting/film/video in the Last Year:   . Engineer, site in the Last Year:   Transportation Needs:   . Freight forwarder (Medical):   Marland Kitchen Lack of Transportation (Non-Medical):   Physical Activity:   . Days of Exercise per Week:   . Minutes of Exercise per Session:   Stress:   . Feeling of Stress :   Social Connections:   . Frequency of Communication with Friends and Family:   . Frequency of Social Gatherings with Friends and Family:   . Attends Religious Services:   . Active Member of Clubs or Organizations:   . Attends Banker Meetings:   Marland Kitchen Marital Status:   Intimate Partner Violence:   . Fear of Current or Ex-Partner:   . Emotionally Abused:   Marland Kitchen Physically Abused:   . Sexually Abused:      Review of Systems  Neurological: Positive for headaches.  All other systems reviewed and are negative.      Objective:   Physical Exam Vitals reviewed.  Constitutional:      General: She is not in acute distress.    Appearance: She is well-developed. She is not diaphoretic.  Cardiovascular:     Rate and Rhythm: Normal rate and regular rhythm.     Heart sounds: Normal heart sounds. No murmur heard.   Pulmonary:     Effort: Pulmonary effort is normal. No respiratory distress.     Breath sounds: Normal breath sounds. No wheezing or rales.  Neurological:     Mental Status: She is alert and oriented to person, place, and time.     Cranial Nerves: No cranial nerve deficit.     Motor: No abnormal muscle tone.     Coordination: Coordination normal.           Assessment & Plan:  Essential hypertension, benign - Plan: CBC with Differential/Platelet, COMPLETE METABOLIC PANEL WITH GFR, Lipid panel  History of migraine  History of anxiety  Pure hypercholesterolemia  I will be glad to refill the patient's albuterol along with her Xanax.  However I explained to the patient that I was concerned that if she uses the medication on a daily basis her body can develop a dependency to it.  Therefore I feel that if we use  an SSRI such as Lexapro, we may better manage it so that she not need the other medication is often.  She is willing to try this again.  Her blood pressure today is outstanding.  I would like to repeat her fasting lab work to reassess her blood sugar and her cholesterol.  Recommended less than 45 g of carbohydrates per meal and also 30 minutes of aerobic exercise daily.  Otherwise the patient is doing well with no concerns

## 2020-05-25 LAB — LIPID PANEL
Cholesterol: 269 mg/dL — ABNORMAL HIGH (ref <200)
HDL: 78 mg/dL (ref >=50)
LDL Cholesterol (Calc): 175 mg/dL (calc) — ABNORMAL HIGH
Non-HDL Cholesterol (Calc): 191 mg/dL (calc) — ABNORMAL HIGH (ref <130)
Total CHOL/HDL Ratio: 3.4 (calc) (ref <5.0)
Triglycerides: 67 mg/dL (ref <150)

## 2020-05-25 LAB — CBC WITH DIFFERENTIAL/PLATELET
Absolute Monocytes: 249 cells/uL (ref 200–950)
Basophils Absolute: 52 cells/uL (ref 0–200)
Basophils Relative: 1.2 %
Eosinophils Absolute: 151 cells/uL (ref 15–500)
Eosinophils Relative: 3.5 %
HCT: 41.1 % (ref 35.0–45.0)
Hemoglobin: 13.1 g/dL (ref 11.7–15.5)
Lymphs Abs: 1776 cells/uL (ref 850–3900)
MCH: 28.7 pg (ref 27.0–33.0)
MCHC: 31.9 g/dL — ABNORMAL LOW (ref 32.0–36.0)
MCV: 90.1 fL (ref 80.0–100.0)
MPV: 11.8 fL (ref 7.5–12.5)
Monocytes Relative: 5.8 %
Neutro Abs: 2073 cells/uL (ref 1500–7800)
Neutrophils Relative %: 48.2 %
Platelets: 122 10*3/uL — ABNORMAL LOW (ref 140–400)
RBC: 4.56 10*6/uL (ref 3.80–5.10)
RDW: 12.9 % (ref 11.0–15.0)
Total Lymphocyte: 41.3 %
WBC: 4.3 10*3/uL (ref 3.8–10.8)

## 2020-05-25 LAB — COMPLETE METABOLIC PANEL WITH GFR
AG Ratio: 1.2 (calc) (ref 1.0–2.5)
ALT: 81 U/L — ABNORMAL HIGH (ref 6–29)
AST: 41 U/L — ABNORMAL HIGH (ref 10–35)
Albumin: 4.1 g/dL (ref 3.6–5.1)
Alkaline phosphatase (APISO): 148 U/L (ref 37–153)
BUN: 15 mg/dL (ref 7–25)
CO2: 28 mmol/L (ref 20–32)
Calcium: 9.6 mg/dL (ref 8.6–10.4)
Chloride: 101 mmol/L (ref 98–110)
Creat: 0.72 mg/dL (ref 0.50–1.05)
GFR, Est African American: 108 mL/min/{1.73_m2} (ref >=60)
GFR, Est Non African American: 94 mL/min/{1.73_m2} (ref >=60)
Globulin: 3.5 g/dL (calc) (ref 1.9–3.7)
Glucose, Bld: 98 mg/dL (ref 65–99)
Potassium: 3.8 mmol/L (ref 3.5–5.3)
Sodium: 139 mmol/L (ref 135–146)
Total Bilirubin: 0.4 mg/dL (ref 0.2–1.2)
Total Protein: 7.6 g/dL (ref 6.1–8.1)

## 2020-05-26 ENCOUNTER — Other Ambulatory Visit: Payer: Self-pay

## 2020-05-26 DIAGNOSIS — E7800 Pure hypercholesterolemia, unspecified: Secondary | ICD-10-CM

## 2020-05-26 DIAGNOSIS — K760 Fatty (change of) liver, not elsewhere classified: Secondary | ICD-10-CM

## 2020-05-26 MED ORDER — ATORVASTATIN CALCIUM 20 MG PO TABS: 20.0000 mg | ORAL_TABLET | Freq: Every day | ORAL | 2 refills | Status: DC

## 2020-06-03 ENCOUNTER — Ambulatory Visit (INDEPENDENT_AMBULATORY_CARE_PROVIDER_SITE_OTHER): Payer: BC Managed Care – PPO | Admitting: Rheumatology

## 2020-06-03 ENCOUNTER — Other Ambulatory Visit: Payer: Self-pay

## 2020-06-03 ENCOUNTER — Encounter: Payer: Self-pay | Admitting: Rheumatology

## 2020-06-03 VITALS — BP 119/75 | HR 73 | Resp 13 | Ht 66.0 in | Wt 163.0 lb

## 2020-06-03 DIAGNOSIS — R768 Other specified abnormal immunological findings in serum: Secondary | ICD-10-CM | POA: Diagnosis not present

## 2020-06-03 DIAGNOSIS — E7800 Pure hypercholesterolemia, unspecified: Secondary | ICD-10-CM

## 2020-06-03 DIAGNOSIS — M2241 Chondromalacia patellae, right knee: Secondary | ICD-10-CM | POA: Diagnosis not present

## 2020-06-03 DIAGNOSIS — K760 Fatty (change of) liver, not elsewhere classified: Secondary | ICD-10-CM

## 2020-06-03 DIAGNOSIS — K219 Gastro-esophageal reflux disease without esophagitis: Secondary | ICD-10-CM

## 2020-06-03 DIAGNOSIS — M3500 Sicca syndrome, unspecified: Secondary | ICD-10-CM | POA: Diagnosis not present

## 2020-06-03 DIAGNOSIS — Z8709 Personal history of other diseases of the respiratory system: Secondary | ICD-10-CM

## 2020-06-03 DIAGNOSIS — M2550 Pain in unspecified joint: Secondary | ICD-10-CM | POA: Diagnosis not present

## 2020-06-03 DIAGNOSIS — M797 Fibromyalgia: Secondary | ICD-10-CM

## 2020-06-03 DIAGNOSIS — Z8669 Personal history of other diseases of the nervous system and sense organs: Secondary | ICD-10-CM

## 2020-06-03 DIAGNOSIS — M2242 Chondromalacia patellae, left knee: Secondary | ICD-10-CM

## 2020-06-03 DIAGNOSIS — I10 Essential (primary) hypertension: Secondary | ICD-10-CM

## 2020-06-04 ENCOUNTER — Other Ambulatory Visit: Payer: Self-pay | Admitting: Family Medicine

## 2020-06-07 ENCOUNTER — Encounter: Payer: Self-pay | Admitting: Rheumatology

## 2020-06-15 ENCOUNTER — Encounter: Payer: Self-pay | Admitting: Family Medicine

## 2020-06-18 ENCOUNTER — Other Ambulatory Visit: Payer: Self-pay | Admitting: Family Medicine

## 2020-07-01 NOTE — Progress Notes (Addendum)
Office Visit Note  Patient: Sherry Daniels             Date of Birth: Dec 14, 1960           MRN: [id number]             PCP: Donita Brooks, MD Referring: Donita Brooks, MD Visit Date: 07/13/2020 Occupation: @GUAROCC @  Subjective:  Dry mouth and dry eyes.   History of Present Illness: Sherry Daniels is a 59 y.o. female returned disease to discuss her lab results.  She was initially evaluated for positive RNP.  She states she continues to have dry mouth and dry eyes.  She has been followed by ophthalmologist.  She was advised to use omega-3 capsules which has been helpful.  She is also using some eyedrops.  There is no history of oral ulcers, nasal ulcers, malar rash, photosensitivity or Raynaud's phenomenon.  She denies any palpitations.  She states she does get shortness of breath from asthma.  She recently went through colonoscopy and after the colonoscopy she developed increased shortness of breath for the next few days.  Activities of Daily Living:  Patient reports morning stiffness for 20-25 minutes.   Patient Reports nocturnal pain.  Difficulty dressing/grooming: Denies Difficulty climbing stairs: Reports Difficulty getting out of chair: Reports Difficulty using hands for taps, buttons, cutlery, and/or writing: Denies  Review of Systems  Constitutional: Positive for fatigue.  HENT: Positive for mouth dryness. Negative for mouth sores and nose dryness.   Eyes: Positive for dryness.  Respiratory: Negative for shortness of breath and difficulty breathing.   Cardiovascular: Negative for chest pain and palpitations.  Gastrointestinal: Positive for blood in stool and constipation. Negative for diarrhea.  Endocrine: Negative for increased urination.  Genitourinary: Negative for difficulty urinating.  Musculoskeletal: Positive for arthralgias, joint pain, myalgias, morning stiffness and myalgias. Negative for joint swelling and muscle tenderness.  Skin:  Negative for color change and rash.  Allergic/Immunologic: Negative for susceptible to infections.  Neurological: Positive for numbness and headaches. Negative for dizziness, memory loss and weakness.  Hematological: Positive for bruising/bleeding tendency.  Psychiatric/Behavioral: Negative for confusion.    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 12/2009   Fibromyalgia    GERD (gastroesophageal reflux disease)    Hyperlipidemia    Hypertension    Migraine    Nonalcoholic fatty liver disease     Family History  Problem Relation Age of Onset   Cancer Mother        stage III, colon   Hypertension Mother    Dementia Mother    Heart disease Mother        bradycardia   Cancer Father        prostate   Hypertension Father    Hyperlipidemia Father    Heart disease Father 6       MI  Hyperlipidemia Sister    Hypertension Sister    Fibromyalgia Sister    Arthritis Brother        RA   Hypertension Brother    Hyperlipidemia Brother    Fibromyalgia Brother    Hypertension Brother    Hyperlipidemia Brother    Healthy Daughter    Healthy Son    Lupus Niece    Past Surgical History:  Procedure Laterality Date   ABDOMINAL HYSTERECTOMY     total   COLONOSCOPY  09/15/2009   Dr. Loreta Ave; normal   WISDOM TOOTH EXTRACTION     Social History   Social History Narrative    Married, 1 daughter and 1 son, pentecostal, exercise most days, walking, biking, running.    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  Constitutional:      Appearance: She is well-developed.  HENT:     Head: Normocephalic and atraumatic.  Eyes:     Conjunctiva/sclera: Conjunctivae normal.  Cardiovascular:     Rate and Rhythm: Normal rate and regular rhythm.     Heart sounds: Normal heart sounds.  Pulmonary:     Effort: Pulmonary effort is normal.     Breath sounds: Normal breath sounds.  Abdominal:     General: Bowel sounds are normal.     Palpations: Abdomen is soft.  Musculoskeletal:     Cervical back: Normal range of motion.  Lymphadenopathy:     Cervical: No cervical adenopathy.  Skin:    General: Skin is warm and dry.     Capillary Refill: Capillary refill takes less than 2 seconds.  Neurological:     Mental Status: She is alert and oriented to person, place, and time.  Psychiatric:        Behavior: Behavior normal.      Musculoskeletal Exam: C-spine, thoracic and lumbar spine were in good range of motion.  Shoulder joints, elbow joints, wrist joints, MCPs PIPs and DIPs with good range of motion with no synovitis.  Hip joints, knee joints, ankles, MTPs and PIPs with good range of motion with no synovitis.  CDAI Exam: CDAI Score: -- Patient Global: --; Provider Global: -- Swollen: --; Tender: -- Joint Exam 07/13/2020   No joint exam has been documented for this visit   There is currently no information documented on the homunculus. Go to the Rheumatology activity and complete the homunculus joint  exam.  Investigation: No additional findings.  Imaging: No results found.  Recent Labs: Lab Results  Component Value Date   WBC 4.7 07/08/2020   HGB 11.3 (L) 07/08/2020   PLT 249 07/08/2020   NA 138 07/08/2020   K 3.6 07/08/2020   CL 100 07/08/2020   CO2 29 07/08/2020   GLUCOSE 82 07/08/2020   BUN 12 07/08/2020   CREATININE 0.77 07/08/2020   BILITOT 0.5 07/08/2020   ALKPHOS 66 03/29/2017   AST 43 (H) 07/08/2020   ALT 61 (H) 07/08/2020   PROT 7.3 07/08/2020   ALBUMIN 3.6 03/29/2017   CALCIUM 9.2 07/08/2020   GFRAA 100 07/08/2020   July 26,  2021AVISE lupus index -3.5, ANA 1: 320 nucleolar dots, ENA negative, CB CAP negative, Jo 1 -, anticardiolipin negative, beta-2 GP 1 -, antiphosphatidylserine negative, C1q positive, RF negative, anti-CCP negative, anti-Car P+, antihistone negative, anti-TPO negative antithyroglobulin negative  Speciality Comments: No specialty comments available.  Procedures:  No procedures performed Allergies: Aspirin, Codeine, Cymbalta [duloxetine hcl], and Simvastatin   Assessment / Plan:     Visit Diagnoses: Polyarthralgia -initially all autoimmune workup negative except RNP 1.0 which was borderline positive.AVISE labs were positive for ANA nucleolar pattern and C1q positive.  RNP was negative.  No other antibodies were positive.  I had detailed discussion regarding the lab work with the patient.  I did not see any synovitis on examination.  Have advised her to contact me in case she develops any new symptoms or swelling.  I will repeat ANA, RNP, complements at the follow-up visit.  Sicca complex(HCC) - ANA, SSA, SSB antibodies were all negative.  She has low titer ANA, C1q positive.  She also gives history of sicca symptoms.  She has been seeing ophthalmologist on a regular basis.  I cannot use pilocarpine as she has history of asthma.  Over-the-counter products were discussed at length.  Detailed counseling guarding Sjogren's was provided.  If her  symptoms persist we may be able to use Plaquenil in the future.  Chondromalacia of both patellae-she was given a handout on knee joint exercises at the last visit.  Have advised her to continue to exercise and do muscle strengthening and weight loss.  Fibromyalgia-she has generalized pain from fibromyalgia.  Stretching exercises were emphasized.  Essential hypertension-her blood pressure was elevated today.  She states she has a stopped some of her blood pressure medication after colonoscopy.  Have advised her to resume her blood pressure medications.  Have also advised her to monitor blood pressure closely and follow-up with her PCP.  History of migraine-she has been experiencing increased headaches which could be related to her blood pressure as well.  Nonalcoholic fatty liver disease  Gastroesophageal reflux disease without esophagitis  Pure hypercholesterolemia  History of pleurisy - 2013  She has received both COVID-19 vaccinations.  I have advised use of mask, social distancing and hand hygiene.  She will also need a booster when available.  Orders: No orders of the defined types were placed in this encounter.  No orders of the defined types were placed in this encounter.     Follow-Up Instructions: Return in about 6 months (around 01/10/2021) for Sjogrens.   Pollyann Savoy, MD  Note - This record has been created using Animal nutritionist.  Chart creation errors have been sought, but may not always  have been located. Such creation errors do not reflect on  the standard of medical care.

## 2020-07-08 ENCOUNTER — Ambulatory Visit: Payer: BC Managed Care – PPO | Admitting: Family Medicine

## 2020-07-08 ENCOUNTER — Other Ambulatory Visit: Payer: Self-pay

## 2020-07-08 VITALS — BP 100/68 | HR 77 | Temp 95.5°F | Ht 66.0 in | Wt 154.0 lb

## 2020-07-08 DIAGNOSIS — K760 Fatty (change of) liver, not elsewhere classified: Secondary | ICD-10-CM | POA: Diagnosis not present

## 2020-07-08 DIAGNOSIS — E7800 Pure hypercholesterolemia, unspecified: Secondary | ICD-10-CM | POA: Diagnosis not present

## 2020-07-08 DIAGNOSIS — D696 Thrombocytopenia, unspecified: Secondary | ICD-10-CM

## 2020-07-08 NOTE — Progress Notes (Signed)
Subjective:    Patient ID: Sherry MaffucciValerie Robertson Daniels, female    DOB: [birth year], 59 y.o.   MRN: [id number]  05/24/20 Since I last saw the patient, she has retired.  However she also recently lost her father.  Therefore she is still having to use the Xanax on a nightly basis to help her sleep.  She does not recollect why she stopped Lexapro.  Her sister takes Lexapro.  She does not recall any serious side effects.  She does not remember whether the medication helped her.  At the present time she is only been using Xanax on a daily basis to help her sleep and control the anxiety.  Her blood pressure today is outstanding at 110/80.  She is not taking Klonopin.  The medication list is incorrect.  She did recently see her gynecologist to perform nonfasting lab work.  Apparently her blood sugar was prediabetic and her LDL cholesterol was very high.  She would like to repeat that today to see what her fasting levels are.  She has recently started exercising and is back to running 3 miles a day.  She is also reduce the carbohydrates in her diet.  Diabetes does run in her family as her brother is a prediabetic.  She also has a history of exercise-induced asthma and would like a refill on her albuterol that she uses sparingly for the occasional attack.  At that time,my plan was: I will be glad to refill the patient's albuterol along with her Xanax.  However I explained to the patient that I was concerned that if she uses the medication on a daily basis her body can develop a dependency to it.  Therefore I feel that if we use an SSRI such as Lexapro, we may better manage it so that she not need the other medication is often.  She is willing to try this again.  Her blood pressure today is outstanding.  I would like to repeat her fasting lab work to reassess her blood sugar and her cholesterol.  Recommended less than 45 g of carbohydrates per meal and also 30 minutes of aerobic exercise daily.  Otherwise the patient is  doing well with no concerns  07/08/20 Wt Readings from Last 3 Encounters:  07/08/20 154 lb (69.9 kg)  06/03/20 163 lb (73.9 kg)  05/24/20 163 lb (73.9 kg)  Patient has lost approximately 9 pounds since her last visit.  She states that the Lexapro is helping.  I recommended a cholesterol pill to help manage her hyperlipidemia as a suspect that this was contributing to her fatty liver disease.  However I wanted to recheck her liver function test 6 weeks after starting the statin.  She is not fasting today and therefore I cannot check her fasting lipid panel however I can certainly follow-up on her liver function test.  The patient recently had a colonoscopy.  Afterwards she reports significant bloating, nausea, and abdominal cramps.  She also has some tenderness to palpation in the right upper quadrant.  She states that she had x-rays in urgent care and also a negative Covid test.  Per her report there was increased air under the diaphragm and in her intestines on the x-ray.  I question if the patient may have had an ileus after her colonoscopy.  She is only have 1 bowel movement.  Today she has sluggish bowel sounds.  She has no guarding or rebound however she is tender to palpation in the right upper quadrant.  We  are also due to follow-up on her thrombocytopenia.  She is being seen by rheumatology for mixed connective tissue disorder yet to be determined.  However she does have a history of a butterfly rash as well as a positive ANA.  Therefore lupus is on the differential diagnosis.  Also would potentially be idiopathic thrombocytopenia purpura as well as thrombocytopenia related to underlying liver inflammation.  Past Medical History:  Diagnosis Date   Allergy    Asthma    Chest pain 11/2009   overnight hospitalization   Constipation    Dr. Loreta Ave   Fibromyalgia    GERD (gastroesophageal reflux disease)    Hyperlipidemia    Hypertension    Migraine    Nonalcoholic fatty liver disease      Past Surgical History:  Procedure Laterality Date   ABDOMINAL HYSTERECTOMY     total   COLONOSCOPY  09/15/2009   Dr. Loreta Ave; normal   WISDOM TOOTH EXTRACTION     Current Outpatient Medications on File Prior to Visit  Medication Sig Dispense Refill   acetaminophen (TYLENOL) 500 MG tablet Take 1,500 mg by mouth every 6 (six) hours as needed. Reported on 05/01/2016     albuterol (VENTOLIN HFA) 108 (90 Base) MCG/ACT inhaler USE 2 PUFFS EVERY 4 TO 6 HOURS AS NEEDED 18 g 2   ALPRAZolam (XANAX) 0.5 MG tablet Take 1 tablet (0.5 mg total) by mouth 3 (three) times daily as needed for anxiety. 30 tablet 0   amLODipine (NORVASC) 5 MG tablet TAKE 1 TABLET BY MOUTH EVERY DAY 90 tablet 3   aspirin 81 MG EC tablet 1 tablet po qOD (Patient taking differently: Take 81 mg by mouth daily. ) 30 tablet 12   atorvastatin (LIPITOR) 20 MG tablet Take 1 tablet (20 mg total) by mouth daily. 90 tablet 2   diclofenac sodium (VOLTAREN) 1 % GEL Apply 2 g topically 4 (four) times daily. 100 g 3   eletriptan (RELPAX) 40 MG tablet TAKE 1 TAB AT ONSET OF HEADACHE.MAY REPEAT IN 2HRS IF HEADACHE PERSISTS 10 tablet 2   escitalopram (LEXAPRO) 10 MG tablet TAKE 1 TABLET BY MOUTH EVERY DAY 90 tablet 2   estradiol (VIVELLE-DOT) 0.1 MG/24HR patch APPLY 1 NEW PATCH TWICE WEEKLY  3   hydrochlorothiazide (MICROZIDE) 12.5 MG capsule TAKE 1 CAPSULE BY MOUTH EVERY DAY 90 capsule 1   ibuprofen (ADVIL,MOTRIN) 600 MG tablet Take 1 tablet (600 mg total) by mouth every 6 (six) hours as needed. 30 tablet 0   linaclotide (LINZESS) 72 MCG capsule Take 72 mcg by mouth daily before breakfast.     Misc Natural Products (TART CHERRY ADVANCED PO) Take by mouth.     Multiple Vitamins-Minerals (MULTIVITAMIN WITH MINERALS) tablet Take 1 tablet by mouth daily.     Omega-3 1000 MG CAPS Take by mouth.     phentermine (ADIPEX-P) 37.5 MG tablet phentermine 37.5 mg tablet  TAKE 1 TABLET EVERY DAY BY ORAL ROUTE IN THE MORNING.      promethazine (PHENERGAN) 12.5 MG tablet Take 1 tablet (12.5 mg total) by mouth every 8 (eight) hours as needed for nausea or vomiting. 20 tablet 0   topiramate (TOPAMAX) 25 MG tablet TAKE 2 TABLETS (50 MG TOTAL) BY MOUTH 2 (TWO) TIMES DAILY. (Patient not taking: Reported on 05/24/2020) 120 tablet 3   TURMERIC PO Take by mouth.     No current facility-administered medications on file prior to visit.   Allergies  Allergen Reactions   Aspirin Nausea Only  Codeine Nausea Only   Cymbalta [Duloxetine Hcl]     Felt like it worsened anxiety and fibromyalgia   Simvastatin Other (See Comments)    dizziness   Social History   Socioeconomic History   Marital status: Married    Spouse name: Not on file   Number of children: Not on file   Years of education: Not on file   Highest education level: Not on file  Occupational History   Occupation: supervise principles in school system    Employer: [employer]  Tobacco Use   Smoking status: Never Smoker   Smokeless tobacco: Never Used  Building services engineer Use: Never used  Substance and Sexual Activity   Alcohol use: No    Alcohol/week: 0.0 standard drinks   Drug use: No   Sexual activity: Yes    Birth control/protection: Surgical  Other Topics Concern   Not on file  Social History Narrative   Married, 1 daughter and 1 son, pentecostal, exercise most days, walking, biking, running.    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 [employer]   Social Determinants of Health   Financial Resource Strain:    Difficulty of Paying Living Expenses: Not on file  Food Insecurity:    Worried About Programme researcher, broadcasting/film/video in the Last Year: Not on file   [employer] of Food in the Last Year: Not on file  Transportation Needs:    Lack of Transportation (Medical): Not on file   Lack of Transportation (Non-Medical): Not on file  Physical Activity:    Days of Exercise per Week: Not on file   Minutes of Exercise per Session: Not on file    Stress:    Feeling of Stress : Not on file  Social Connections:    Frequency of Communication with Friends and Family: Not on file   Frequency of Social Gatherings with Friends and Family: Not on file   Attends Religious Services: Not on file   Active Member of Clubs or Organizations: Not on file   Attends Banker Meetings: Not on file   Marital Status: Not on file  Intimate Partner Violence:    Fear of Current or Ex-Partner: Not on file   Emotionally Abused: Not on file   Physically Abused: Not on file   Sexually Abused: Not on file     Review of Systems  All other systems reviewed and are negative.      Objective:   Physical Exam Vitals reviewed.  Constitutional:      General: She is not in acute distress.    Appearance: She is well-developed. She is not diaphoretic.  Cardiovascular:     Rate and Rhythm: Normal rate and regular rhythm.     Heart sounds: Normal heart sounds. No murmur heard.   Pulmonary:     Effort: Pulmonary effort is normal. No respiratory distress.     Breath sounds: Normal breath sounds. No wheezing or rales.  Neurological:     Mental Status: She is alert and oriented to person, place, and time.     Cranial Nerves: No cranial nerve deficit.     Motor: No abnormal muscle tone.     Coordination: Coordination normal.           Assessment & Plan:  Fatty liver disease, nonalcoholic - Plan: CBC with Differential/Platelet, COMPLETE METABOLIC PANEL WITH GFR  Pure hypercholesterolemia  Thrombocytopenia (HCC)  Patient's abdominal pain is improving 1 week out from her colonoscopy.  She  denies any further fevers.  I have recommended that she take a stimulant laxative if she has only had 1 bowel movement in the last week.  I recommended that she push Gatorade to avoid dehydration.  Meanwhile hold both blood pressure medicines hydrochlorothiazide and amlodipine until systolic blood pressures greater than 140 as she appears  dehydrated.  Meanwhile check liver function test to ensure that the statin medication is not aggravating her fatty liver disease.  Also check a CBC to monitor her thrombocytopenia.  Patient has an appointment in the next month to see her rheumatologist to determine the type of connective tissue disorder that she may have.  Differential diagnosis for her low platelets includes autoimmune disease such as lupus, ITP, or mild thrombocytopenia related to liver dysfunction.  Is also possible that her low platelet count was a spurious one-time finding.  Therefore I want to repeat it today

## 2020-07-09 LAB — COMPLETE METABOLIC PANEL WITH GFR
AG Ratio: 1.2 (calc) (ref 1.0–2.5)
ALT: 61 U/L — ABNORMAL HIGH (ref 6–29)
AST: 43 U/L — ABNORMAL HIGH (ref 10–35)
Albumin: 4 g/dL (ref 3.6–5.1)
Alkaline phosphatase (APISO): 191 U/L — ABNORMAL HIGH (ref 37–153)
BUN: 12 mg/dL (ref 7–25)
CO2: 29 mmol/L (ref 20–32)
Calcium: 9.2 mg/dL (ref 8.6–10.4)
Chloride: 100 mmol/L (ref 98–110)
Creat: 0.77 mg/dL (ref 0.50–1.05)
GFR, Est African American: 100 mL/min/{1.73_m2} (ref >=60)
GFR, Est Non African American: 86 mL/min/{1.73_m2} (ref >=60)
Globulin: 3.3 g/dL (calc) (ref 1.9–3.7)
Glucose, Bld: 82 mg/dL (ref 65–99)
Potassium: 3.6 mmol/L (ref 3.5–5.3)
Sodium: 138 mmol/L (ref 135–146)
Total Bilirubin: 0.5 mg/dL (ref 0.2–1.2)
Total Protein: 7.3 g/dL (ref 6.1–8.1)

## 2020-07-09 LAB — CBC WITH DIFFERENTIAL/PLATELET
Absolute Monocytes: 230 cells/uL (ref 200–950)
Basophils Absolute: 42 cells/uL (ref 0–200)
Basophils Relative: 0.9 %
Eosinophils Absolute: 249 cells/uL (ref 15–500)
Eosinophils Relative: 5.3 %
HCT: 34 % — ABNORMAL LOW (ref 35.0–45.0)
Hemoglobin: 11.3 g/dL — ABNORMAL LOW (ref 11.7–15.5)
Lymphs Abs: 1692 cells/uL (ref 850–3900)
MCH: 28.9 pg (ref 27.0–33.0)
MCHC: 33.2 g/dL (ref 32.0–36.0)
MCV: 87 fL (ref 80.0–100.0)
MPV: 10.4 fL (ref 7.5–12.5)
Monocytes Relative: 4.9 %
Neutro Abs: 2486 cells/uL (ref 1500–7800)
Neutrophils Relative %: 52.9 %
Platelets: 249 10*3/uL (ref 140–400)
RBC: 3.91 10*6/uL (ref 3.80–5.10)
RDW: 12.4 % (ref 11.0–15.0)
Total Lymphocyte: 36 %
WBC: 4.7 10*3/uL (ref 3.8–10.8)

## 2020-07-13 ENCOUNTER — Encounter: Payer: Self-pay | Admitting: Rheumatology

## 2020-07-13 ENCOUNTER — Other Ambulatory Visit: Payer: Self-pay

## 2020-07-13 ENCOUNTER — Ambulatory Visit: Payer: BC Managed Care – PPO | Admitting: Rheumatology

## 2020-07-13 VITALS — BP 153/91 | HR 84 | Resp 14 | Ht 66.0 in | Wt 157.6 lb

## 2020-07-13 DIAGNOSIS — I10 Essential (primary) hypertension: Secondary | ICD-10-CM

## 2020-07-13 DIAGNOSIS — Z8669 Personal history of other diseases of the nervous system and sense organs: Secondary | ICD-10-CM

## 2020-07-13 DIAGNOSIS — K219 Gastro-esophageal reflux disease without esophagitis: Secondary | ICD-10-CM

## 2020-07-13 DIAGNOSIS — M2242 Chondromalacia patellae, left knee: Secondary | ICD-10-CM

## 2020-07-13 DIAGNOSIS — M797 Fibromyalgia: Secondary | ICD-10-CM

## 2020-07-13 DIAGNOSIS — M2241 Chondromalacia patellae, right knee: Secondary | ICD-10-CM

## 2020-07-13 DIAGNOSIS — K760 Fatty (change of) liver, not elsewhere classified: Secondary | ICD-10-CM

## 2020-07-13 DIAGNOSIS — Z8709 Personal history of other diseases of the respiratory system: Secondary | ICD-10-CM

## 2020-07-13 DIAGNOSIS — R768 Other specified abnormal immunological findings in serum: Secondary | ICD-10-CM

## 2020-07-13 DIAGNOSIS — M3500 Sicca syndrome, unspecified: Secondary | ICD-10-CM

## 2020-07-13 DIAGNOSIS — M2550 Pain in unspecified joint: Secondary | ICD-10-CM

## 2020-07-13 DIAGNOSIS — E7800 Pure hypercholesterolemia, unspecified: Secondary | ICD-10-CM

## 2020-07-13 NOTE — Patient Instructions (Signed)
Sjgren's Syndrome Sjgren's syndrome is a disease in which the body's disease-fighting system (immune system) attacks the glands that produce tears (lacrimal glands) and the glands that produce saliva (salivary glands). This makes the eyes and mouth very dry. Sjgren's syndrome is a long-term (chronic) disorder that has no cure. In some cases, it is linked to other disorders (rheumatic disorders), such as rheumatoid arthritis and systemic lupus erythematosus (SLE). It may affect other parts of the body, such as the:  Kidneys.  Blood vessels.  Joints.  Lungs.  Liver.  Pancreas.  Brain.  Nerves.  Spinal cord. What are the causes? The cause of this condition is not known. It may be passed along from parent to child (inherited), or it may be a symptom of a rheumatic disorder. What increases the risk? This condition is more likely to develop in:  Women.  People who are 45-50 years old.  People who have recently had a viral infection or currently have a viral infection. What are the signs or symptoms? The main symptoms of this condition are:  Dry mouth. This may include: ? A chalky feeling. ? Difficulty swallowing, speaking, or tasting. ? Frequent cavities in the teeth. ? Frequent mouth infections.  Dry eyes. This may include: ? Burning, redness, and itching. ? Blurry vision. ? Light sensitivity. Other symptoms may include:  Dryness of the skin and the inside of the nose.  Eyelid infections.  Vaginal dryness, if this applies.  Joint pain and stiffness.  Muscle pain and stiffness. How is this diagnosed? This condition is diagnosed based on:  Your symptoms.  Your medical history.  A physical exam of your eyes and mouth.  Tests, including: ? A Schirmer test. This tests your tear production. ? An eye exam that is done with a magnifying device (slit-lamp exam). ? An eye test that temporarily stains your eye with dye. This shows the extent of eye  damage. ? Tests to check your salivary gland function. ? Biopsy. This is a removal of part of a salivary gland from inside your lower lip to be studied under a microscope. ? Chest X-rays. ? Blood tests. ? Urine tests. How is this treated? There is no cure for this condition, but treatment can help you manage your symptoms. This condition may be treated with:  Moisture replacement therapies to help relieve dryness in your skin, mouth, and eyes.  Medicines to help relieve pain and stiffness.  Medicines to help relieve inflammation in your body (corticosteroids). These are usually for severe cases.  Medicines to help reduce the activity of your immune system (immunosuppressants).  Surgery or insertion of plugs to close the lacrimal glands (punctal occlusion). This helps keep more natural tears in your eyes. Follow these instructions at home: Eye care   Use eye drops as told by your health care provider.  Protect your eyes from the sun and wind with sunglasses or glasses.  Blink at least 5-6 times a minute.  Maintain properly humidified air. You may want to use a humidifier at home.  Avoid smoke. Mouth care  Brush your teeth and floss after every meal.  Chew sugar-free gum or suck on hard candy. This may help to relieve dry mouth.  Use antimicrobial mouthwash daily.  Take frequent sips of water or sugar-free drinks.  Use saliva substitutes or lip balm as told by your health care provider.  Schedule and attend dentist visits every 6 months. General instructions   Take over-the-counter and prescription medicines only as told by   your health care provider.  Drink enough fluid to keep your urine pale yellow.  Keep all follow-up visits as told by your health care provider. This is important. Contact a health care provider if:  You have a fever.  You have night sweats.  You are always tired.  You have unexplained weight loss.  You develop itchy skin.  You have red  patches on your skin.  You have a lump or swelling on your neck. Summary  Sjgren's syndrome is a disease in which the body's disease-fighting system attacks the glands that produce tears and the glands that produce saliva.  This condition makes the eyes and mouth very dry.  Sjgren's syndrome is a long-term (chronic) disorder that has no cure.  The cause of this condition is not known.  There is no cure for this condition, but treatment can help you manage your symptoms. This information is not intended to replace advice given to you by your health care provider. Make sure you discuss any questions you have with your health care provider. Document Revised: 09/05/2018 Document Reviewed: 09/05/2018 Elsevier Patient Education  2020 [employer]  

## 2020-08-16 ENCOUNTER — Ambulatory Visit: Payer: BC Managed Care – PPO | Admitting: Family Medicine

## 2020-08-20 ENCOUNTER — Ambulatory Visit: Payer: BC Managed Care – PPO | Admitting: Family Medicine

## 2020-08-26 ENCOUNTER — Ambulatory Visit: Payer: BC Managed Care – PPO | Admitting: Family Medicine

## 2020-08-26 ENCOUNTER — Other Ambulatory Visit: Payer: Self-pay

## 2020-08-26 VITALS — BP 130/80 | HR 76 | Temp 97.7°F | Ht 66.0 in | Wt 152.0 lb

## 2020-08-26 DIAGNOSIS — K581 Irritable bowel syndrome with constipation: Secondary | ICD-10-CM | POA: Diagnosis not present

## 2020-08-26 MED ORDER — LUBIPROSTONE 24 MCG PO CAPS: 24.0000 ug | ORAL_CAPSULE | Freq: Two times a day (BID) | ORAL | 1 refills | Status: DC

## 2020-08-26 NOTE — Progress Notes (Signed)
Subjective:    Patient ID: Sherry Daniels, female    DOB: [birth year], 59 y.o.   MRN: [id number]  05/24/20 Since I last saw the patient, she has retired.  However she also recently lost her father.  Therefore she is still having to use the Xanax on a nightly basis to help her sleep.  She does not recollect why she stopped Lexapro.  Her sister takes Lexapro.  She does not recall any serious side effects.  She does not remember whether the medication helped her.  At the present time she is only been using Xanax on a daily basis to help her sleep and control the anxiety.  Her blood pressure today is outstanding at 110/80.  She is not taking Klonopin.  The medication list is incorrect.  She did recently see her gynecologist to perform nonfasting lab work.  Apparently her blood sugar was prediabetic and her LDL cholesterol was very high.  She would like to repeat that today to see what her fasting levels are.  She has recently started exercising and is back to running 3 miles a day.  She is also reduce the carbohydrates in her diet.  Diabetes does run in her family as her brother is a prediabetic.  She also has a history of exercise-induced asthma and would like a refill on her albuterol that she uses sparingly for the occasional attack.  At that time,my plan was: I will be glad to refill the patient's albuterol along with her Xanax.  However I explained to the patient that I was concerned that if she uses the medication on a daily basis her body can develop a dependency to it.  Therefore I feel that if we use an SSRI such as Lexapro, we may better manage it so that she not need the other medication is often.  She is willing to try this again.  Her blood pressure today is outstanding.  I would like to repeat her fasting lab work to reassess her blood sugar and her cholesterol.  Recommended less than 45 g of carbohydrates per meal and also 30 minutes of aerobic exercise daily.  Otherwise the patient is  doing well with no concerns  07/08/20 Wt Readings from Last 3 Encounters:  08/26/20 152 lb (68.9 kg)  07/13/20 157 lb 9.6 oz (71.5 kg)  07/08/20 154 lb (69.9 kg)  Patient has lost approximately 9 pounds since her last visit.  She states that the Lexapro is helping.  I recommended a cholesterol pill to help manage her hyperlipidemia as a suspect that this was contributing to her fatty liver disease.  However I wanted to recheck her liver function test 6 weeks after starting the statin.  She is not fasting today and therefore I cannot check her fasting lipid panel however I can certainly follow-up on her liver function test.  The patient recently had a colonoscopy.  Afterwards she reports significant bloating, nausea, and abdominal cramps.  She also has some tenderness to palpation in the right upper quadrant.  She states that she had x-rays in urgent care and also a negative Covid test.  Per her report there was increased air under the diaphragm and in her intestines on the x-ray.  I question if the patient may have had an ileus after her colonoscopy.  She is only have 1 bowel movement.  Today she has sluggish bowel sounds.  She has no guarding or rebound however she is tender to palpation in the right upper quadrant.  We are also due to follow-up on her thrombocytopenia.  She is being seen by rheumatology for mixed connective tissue disorder yet to be determined.  However she does have a history of a butterfly rash as well as a positive ANA.  Therefore lupus is on the differential diagnosis.  Also would potentially be idiopathic thrombocytopenia purpura as well as thrombocytopenia related to underlying liver inflammation.  At that time, my plan was: Patient's abdominal pain is improving 1 week out from her colonoscopy.  She denies any further fevers.  I have recommended that she take a stimulant laxative if she has only had 1 bowel movement in the last week.  I recommended that she push Gatorade to avoid  dehydration.  Meanwhile hold both blood pressure medicines hydrochlorothiazide and amlodipine until systolic blood pressures greater than 140 as she appears dehydrated.  Meanwhile check liver function test to ensure that the statin medication is not aggravating her fatty liver disease.  Also check a CBC to monitor her thrombocytopenia.  Patient has an appointment in the next month to see her rheumatologist to determine the type of connective tissue disorder that she may have.  Differential diagnosis for her low platelets includes autoimmune disease such as lupus, ITP, or mild thrombocytopenia related to liver dysfunction.  Is also possible that her low platelet count was a spurious one-time finding.  Therefore I want to repeat it today  08/26/20 Patient has a history of IBS and chronic constipation.  She states that Linzess will work for a few days and then she will not have a bowel movement for 3 to 4 days and then it will work again.  Therefore she does not feel that the Linzess is effective.  Her gastroenterologist, Dr. Loreta Ave, recently gave her Motegrity.  She took 1 dose of it.  She then developed nausea.  She began to become diaphoretic.  She began to develop crampy abdominal pain.  The pain became so bad that she called EMS.  When EMS arrived, they stated that her blood pressure was dropping.  She was orthostatic.  When she lied down the symptoms improved however she continued to have crampy abdominal pain.  She refused to go to the emergency room.  Afterward she began to defecate and her pain resolved.  I suspect that she had a vasovagal reaction either due to pain or possibly due to straining with defecation causing her blood pressure to drop.  However the event seems to be triggered on constipation.  Patient is trying to get a second opinion with a different gastroenterologist.  She would like to try different medication for chronic constipation.  Past Medical History:  Diagnosis Date  . Allergy   .  Asthma   . Chest pain 11/2009   overnight hospitalization  . Constipation    Dr. Loreta Ave  . Fibromyalgia   . GERD (gastroesophageal reflux disease)   . Hyperlipidemia   . Hypertension   . Migraine   . Nonalcoholic fatty liver disease    Past Surgical History:  Procedure Laterality Date  . ABDOMINAL HYSTERECTOMY     total  . COLONOSCOPY  09/15/2009   Dr. Loreta Ave; normal  . WISDOM TOOTH EXTRACTION     Current Outpatient Medications on File Prior to Visit  Medication Sig Dispense Refill  . acetaminophen (TYLENOL) 500 MG tablet Take 1,500 mg by mouth every 6 (six) hours as needed. Reported on 05/01/2016    . albuterol (VENTOLIN HFA) 108 (90 Base) MCG/ACT inhaler USE 2 PUFFS EVERY  4 TO 6 HOURS AS NEEDED 18 g 2  . ALPRAZolam (XANAX) 0.5 MG tablet Take 1 tablet (0.5 mg total) by mouth 3 (three) times daily as needed for anxiety. 30 tablet 0  . aspirin 81 MG EC tablet 1 tablet po qOD (Patient taking differently: Take 81 mg by mouth daily. ) 30 tablet 12  . atorvastatin (LIPITOR) 20 MG tablet Take 1 tablet (20 mg total) by mouth daily. 90 tablet 2  . diclofenac sodium (VOLTAREN) 1 % GEL Apply 2 g topically 4 (four) times daily. 100 g 3  . eletriptan (RELPAX) 40 MG tablet TAKE 1 TAB AT ONSET OF HEADACHE.MAY REPEAT IN 2HRS IF HEADACHE PERSISTS 10 tablet 2  . escitalopram (LEXAPRO) 10 MG tablet TAKE 1 TABLET BY MOUTH EVERY DAY 90 tablet 2  . estradiol (VIVELLE-DOT) 0.1 MG/24HR patch APPLY 1 NEW PATCH TWICE WEEKLY  3  . hydrochlorothiazide (MICROZIDE) 12.5 MG capsule TAKE 1 CAPSULE BY MOUTH EVERY DAY 90 capsule 1  . ibuprofen (ADVIL,MOTRIN) 600 MG tablet Take 1 tablet (600 mg total) by mouth every 6 (six) hours as needed. 30 tablet 0  . linaclotide (LINZESS) 72 MCG capsule Take 72 mcg by mouth daily before breakfast.    . Misc Natural Products (TART CHERRY ADVANCED PO) Take by mouth.    . Multiple Vitamins-Minerals (MULTIVITAMIN WITH MINERALS) tablet Take 1 tablet by mouth daily.    . Omega-3 1000 MG  CAPS Take by mouth.    . phentermine (ADIPEX-P) 37.5 MG tablet phentermine 37.5 mg tablet  TAKE 1 TABLET EVERY DAY BY ORAL ROUTE IN THE MORNING.    . promethazine (PHENERGAN) 12.5 MG tablet Take 1 tablet (12.5 mg total) by mouth every 8 (eight) hours as needed for nausea or vomiting. 20 tablet 0  . topiramate (TOPAMAX) 25 MG tablet TAKE 2 TABLETS (50 MG TOTAL) BY MOUTH 2 (TWO) TIMES DAILY. 120 tablet 3  . TURMERIC PO Take by mouth.     No current facility-administered medications on file prior to visit.   Allergies  Allergen Reactions  . Aspirin Nausea Only  . Codeine Nausea Only  . Cymbalta [Duloxetine Hcl]     Felt like it worsened anxiety and fibromyalgia  . Simvastatin Other (See Comments)    dizziness   Social History   Socioeconomic History  . Marital status: Married    Spouse name: Not on file  . Number of children: Not on file  . Years of education: Not on file  . Highest education level: Not on file  Occupational History  . 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 Smoking status: Never Smoker  . Smokeless tobacco: Never Used  Vaping Use  . Vaping Use: Never used  Substance and Sexual Activity  . Alcohol use: No    Alcohol/week: 0.0 standard drinks  . Drug use: No  . Sexual activity: Yes    Birth control/protection: Surgical  Other Topics Concern  . Not on file  Social History Narrative   Married, 1 daughter and 1 son, pentecostal, exercise most days, walking, biking, running.    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 Difficulty of Paying Living Expenses: Not on file  Food Insecurity:   . Worried About Programme researcher, broadcasting/film/video in the Last Year: Not on file  . Ran Out of Food in the Last Year: Not on  file  Transportation Needs:   . Freight forwarder (Medical): Not on file  . Lack of Transportation (Non-Medical): Not on file   Physical Activity:   . Days of Exercise per Week: Not on file  . Minutes of Exercise per Session: Not on file  Stress:   . Feeling of Stress : Not on file  Social Connections:   . Frequency of Communication with Friends and Family: Not on file  . Frequency of Social Gatherings with Friends and Family: Not on file  . Attends Religious Services: Not on file  . Active Member of Clubs or Organizations: Not on file  . Attends Banker Meetings: Not on file  . Marital Status: Not on file  Intimate Partner Violence:   . Fear of Current or Ex-Partner: Not on file  . Emotionally Abused: Not on file  . Physically Abused: Not on file  . Sexually Abused: Not on file     Review of Systems  All other systems reviewed and are negative.      Objective:   Physical Exam Vitals reviewed.  Constitutional:      General: She is not in acute distress.    Appearance: She is well-developed. She is not diaphoretic.  Cardiovascular:     Rate and Rhythm: Normal rate and regular rhythm.     Heart sounds: Normal heart sounds.  Pulmonary:     Effort: Pulmonary effort is normal. No respiratory distress.     Breath sounds: Normal breath sounds.  Abdominal:     General: Abdomen is flat. There is distension.     Tenderness: There is no abdominal tenderness.  Neurological:     Mental Status: She is alert and oriented to person, place, and time.     Cranial Nerves: No cranial nerve deficit.     Motor: No abnormal muscle tone.     Coordination: Coordination normal.     Patient reports that she feels distended.  There is no guarding.  There is no evidence of an acute abdomen.      Assessment & Plan:  Irritable bowel syndrome with constipation  We will trial Amitiza 24 mcg p.o. twice daily to see if this will help with her chronic constipation.  She is already tried and failed Dulcolax, Linzess, Motegrity.  Patient is arranging her own second opinion with a different gastroenterologist

## 2020-09-05 ENCOUNTER — Other Ambulatory Visit: Payer: Self-pay | Admitting: Family Medicine

## 2020-10-21 ENCOUNTER — Other Ambulatory Visit (HOSPITAL_COMMUNITY): Payer: Self-pay | Admitting: Obstetrics & Gynecology

## 2020-10-21 ENCOUNTER — Other Ambulatory Visit: Payer: Self-pay | Admitting: Obstetrics & Gynecology

## 2020-10-21 DIAGNOSIS — R1030 Lower abdominal pain, unspecified: Secondary | ICD-10-CM

## 2020-10-28 ENCOUNTER — Other Ambulatory Visit: Payer: Self-pay

## 2020-10-28 ENCOUNTER — Other Ambulatory Visit (HOSPITAL_COMMUNITY): Payer: Self-pay | Admitting: Obstetrics & Gynecology

## 2020-10-28 ENCOUNTER — Ambulatory Visit (HOSPITAL_COMMUNITY)
Admission: RE | Admit: 2020-10-28 | Discharge: 2020-10-28 | Disposition: A | Discharge status: Home / Self Care | Payer: BC Managed Care – PPO | Source: Ambulatory Visit | Attending: Obstetrics & Gynecology | Admitting: Obstetrics & Gynecology

## 2020-10-28 DIAGNOSIS — R1030 Lower abdominal pain, unspecified: Secondary | ICD-10-CM | POA: Insufficient documentation

## 2020-10-29 ENCOUNTER — Encounter (HOSPITAL_COMMUNITY): Payer: Self-pay

## 2020-10-29 ENCOUNTER — Emergency Department (HOSPITAL_COMMUNITY)
Admission: EM | Admit: 2020-10-29 | Discharge: 2020-10-29 | Disposition: A | Discharge status: Home / Self Care | Payer: BC Managed Care – PPO | Attending: Emergency Medicine | Admitting: Emergency Medicine

## 2020-10-29 ENCOUNTER — Emergency Department (HOSPITAL_COMMUNITY): Payer: BC Managed Care – PPO

## 2020-10-29 DIAGNOSIS — Z7982 Long term (current) use of aspirin: Secondary | ICD-10-CM | POA: Insufficient documentation

## 2020-10-29 DIAGNOSIS — W228XXA Striking against or struck by other objects, initial encounter: Secondary | ICD-10-CM | POA: Diagnosis not present

## 2020-10-29 DIAGNOSIS — M79671 Pain in right foot: Secondary | ICD-10-CM

## 2020-10-29 DIAGNOSIS — J45909 Unspecified asthma, uncomplicated: Secondary | ICD-10-CM | POA: Insufficient documentation

## 2020-10-29 DIAGNOSIS — S99921A Unspecified injury of right foot, initial encounter: Secondary | ICD-10-CM | POA: Diagnosis present

## 2020-10-29 DIAGNOSIS — I10 Essential (primary) hypertension: Secondary | ICD-10-CM | POA: Insufficient documentation

## 2020-10-29 DIAGNOSIS — Z79899 Other long term (current) drug therapy: Secondary | ICD-10-CM | POA: Diagnosis not present

## 2020-10-29 DIAGNOSIS — S90121A Contusion of right lesser toe(s) without damage to nail, initial encounter: Secondary | ICD-10-CM | POA: Diagnosis not present

## 2020-10-29 NOTE — ED Triage Notes (Signed)
Pt presents with c/o right foot injury. Pt reports she ran her foot into a door.

## 2020-10-29 NOTE — ED Provider Notes (Signed)
[facility]   CSN: [id number] Arrival date & time: 10/29/20  0008     History Chief Complaint  Patient presents with   Foot Injury    Darnelle MaffucciValerie Robertson Hsiung is a 59 y.o. female.  The history is provided by the patient and medical records.    59 year old female with history of seasonal allergies, asthma, fibromyalgia, GERD, hypertension, hyperlipidemia, presenting to the ED with right foot pain.  States she tried to open a door that was stuck, door actually went over her right foot.  She has had ongoing swelling and pain to dorsal right foot since this occurred on Tuesday, 3 days ago.  Pain worse with weightbearing and ambulation.  She has tried ice and elevate at home but has had some progressive swelling.  Past Medical History:  Diagnosis Date   Allergy    Asthma    Chest pain 11/2009   overnight hospitalization   Constipation    Dr. 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 Loreta AveMann; normal   WISDOM TOOTH EXTRACTION       OB History   No obstetric history on file.     Family History  Problem Relation Age of Onset   Cancer Mother        stage III, colon    Hypertension Mother    Dementia Mother    Heart disease Mother        bradycardia   Cancer Father        prostate   Hypertension Father    Hyperlipidemia Father    Heart disease Father [id number]       MI    Hyperlipidemia Sister    Hypertension Sister    Fibromyalgia Sister    Arthritis Brother        RA   Hypertension Brother    Hyperlipidemia Brother    Fibromyalgia Brother    Hypertension Brother    Hyperlipidemia Brother    Healthy Daughter    Healthy Son    Lupus Niece     Social History   Tobacco Use   Smoking status: Never Smoker   Smokeless tobacco: Never Used  Building services engineerVaping Use   Vaping Use: Never used  Substance Use Topics   Alcohol use: No    Alcohol/week: 0.0 standard drinks   Drug use: No    Home Medications Prior to Admission medications   Medication Sig Start Date End Date Taking? Authorizing Provider  acetaminophen (TYLENOL) 500 MG tablet Take 1,500 mg by mouth every 6 (six) hours as needed. Reported on 05/01/2016    [provider]  albuterol (VENTOLIN HFA) 108 (90 Base) MCG/ACT inhaler USE 2 PUFFS EVERY 4 TO 6 HOURS  AS NEEDED 05/24/20   Donita Brooks, MD  ALPRAZolam Prudy Feeler) 0.5 MG tablet TAKE 1 TABLET (0.5 MG TOTAL) BY MOUTH 3 (THREE) TIMES DAILY AS NEEDED FOR ANXIETY. 09/06/20   Donita Brooks, MD  aspirin 81 MG EC tablet 1 tablet po qOD Patient taking differently: Take 81 mg by mouth daily.  07/05/12   Tysinger, Kermit Balo, PA-C  atorvastatin (LIPITOR) 20 MG tablet Take 1 tablet (20 mg total) by mouth daily. 05/26/20   Donita Brooks, MD  diclofenac sodium (VOLTAREN) 1 % GEL Apply 2 g topically 4 (four) times daily. 03/09/17   [place], Velna Hatchet, MD  eletriptan (RELPAX) 40 MG tablet TAKE 1 TAB AT ONSET OF HEADACHE.MAY REPEAT IN 2HRS IF HEADACHE PERSISTS 09/09/18   Donita Brooks, MD  escitalopram (LEXAPRO) 10 MG tablet TAKE 1 TABLET BY MOUTH EVERY DAY 06/18/20   Donita Brooks, MD  estradiol (VIVELLE-DOT) 0.1 MG/24HR patch  APPLY 1 NEW PATCH TWICE WEEKLY 04/22/18   [provider]  hydrochlorothiazide (MICROZIDE) 12.5 MG capsule TAKE 1 CAPSULE BY MOUTH EVERY DAY 04/01/20   Donita Brooks, MD  ibuprofen (ADVIL,MOTRIN) 600 MG tablet Take 1 tablet (600 mg total) by mouth every 6 (six) hours as needed. 10/16/15   Mady Gemma, PA-C  linaclotide (LINZESS) 72 MCG capsule Take 72 mcg by mouth daily before breakfast.    [provider]  lubiprostone (AMITIZA) 24 MCG capsule Take 1 capsule (24 mcg total) by mouth 2 (two) times daily with a meal. 08/26/20   Pickard, Priscille Heidelberg, MD  Misc Natural Products (TART CHERRY ADVANCED PO) Take by mouth.    [provider]  Multiple Vitamins-Minerals (MULTIVITAMIN WITH MINERALS) tablet Take 1 tablet by mouth daily.    [provider]  Omega-3 1000 MG CAPS Take by mouth.    [provider]  phentermine (ADIPEX-P) 37.5 MG tablet phentermine 37.5 mg tablet  TAKE 1 TABLET EVERY DAY BY ORAL ROUTE IN THE MORNING.    [provider]  promethazine (PHENERGAN) 12.5 MG tablet Take 1 tablet (12.5 mg total) by mouth every 8 (eight) hours as needed for nausea or vomiting. 01/29/17   [place], Velna Hatchet, MD  topiramate (TOPAMAX) 25 MG tablet TAKE 2 TABLETS (50 MG TOTAL) BY MOUTH 2 (TWO) TIMES DAILY. 02/18/18   Donita Brooks, MD  TURMERIC PO Take by mouth.    [provider]    Allergies    Aspirin, Codeine, Cymbalta [duloxetine hcl], and Simvastatin  Review of Systems   Review of Systems  Musculoskeletal: Positive for arthralgias.  All other systems reviewed and are negative.   Physical Exam Updated Vital Signs BP (!) 146/81 (BP Location: Right Arm)    Pulse 79    Temp 98 F (36.7 C) (Oral)    Resp 18    SpO2 99%   Physical Exam Vitals and nursing note reviewed.  Constitutional:      Appearance: She is well-developed and well-nourished.  HENT:     Head: Normocephalic and atraumatic.     Mouth/Throat:     Mouth:  Oropharynx is clear and moist.  Eyes:     Extraocular Movements: EOM normal.     Conjunctiva/sclera: Conjunctivae normal.     Pupils: Pupils are equal, round, and reactive to light.  Cardiovascular:     Rate and Rhythm: Normal rate and regular rhythm.     Heart sounds: Normal heart sounds.  Pulmonary:     Effort: Pulmonary effort is  normal.     Breath sounds: Normal breath sounds. No stridor. No wheezing.  Abdominal:     General: Bowel sounds are normal.     Palpations: Abdomen is soft.     Tenderness: There is no abdominal tenderness. There is no rebound.  Musculoskeletal:        General: Normal range of motion.     Cervical back: Normal range of motion.     Comments: Right distal dorsal foot with swelling extending into the third through fifth toes, there is some bruising along the fourth toe but no bony deformities, DP pulse intact, moving toes normally, good distal sensation and cap refill  Skin:    General: Skin is warm and dry.  Neurological:     Mental Status: She is alert and oriented to person, place, and time.  Psychiatric:        Mood and Affect: Mood and affect normal.     ED Results / Procedures / Treatments   Labs (all labs ordered are listed, but only abnormal results are displayed) Labs Reviewed - No data to display  EKG None  Radiology US PELVIS (TRANSABDOMINAL ONLY)  Result Date: 10/28/2020 CLINICAL DATA:  Lower abdominal pain, hysterectomy. EXAM: TRANSABDOMINAL ULTRASOUND OF PELVIS TECHNIQUE: Transabdominal ultrasound examination of the pelvis was performed including evaluation of the uterus, ovaries, adnexal regions, and pelvic cul-de-sac. Per ultrasound technologist note: A transvaginal ultrasound was not performed as the patient was unable to wait for a chaperon for the vaginal ultrasound part. COMPARISON:  ultrasound abdomen 04/13/2017, CT abdomen pelvis report without images. FINDINGS: Uterus Status post hysterectomy. Right ovary Not visualized.   Possibly surgically absent. Left ovary Not visualized.  Possibly surgically absent. Other findings:  No abnormal free fluid. IMPRESSION: Status post hysterectomy. Bilateral ovaries are not visualized on this transabdominal ultrasound. Please note that a transvaginal ultrasound was NOT performed. Electronically Signed   By: Tish Frederickson M.D.   On: 10/28/2020 23:01   DG Foot Complete Right  Result Date: 10/29/2020 CLINICAL DATA:  Kicked door, injured foot. Swelling 3rd through 5th metatarsal area EXAM: RIGHT FOOT COMPLETE - 3+ VIEW COMPARISON:  None. FINDINGS: There is no evidence of fracture or dislocation. There is no evidence of arthropathy or other focal bone abnormality. Soft tissues are unremarkable. IMPRESSION: Negative. Electronically Signed   By: Charlett Nose M.D.   On: 10/29/2020 01:25    Procedures Procedures (including critical care time)  Medications Ordered in ED Medications - No data to display  ED Course  I have reviewed the triage vital signs and the nursing notes.  Pertinent labs & imaging results that were available during my care of the patient were reviewed by me and considered in my medical decision making (see chart for details).    MDM Rules/Calculators/A&P  59 year old female here with right foot injury after door went over the foot.  Has some swelling and bruising noted to right dorsal foot but there is no bony deformity.  Her foot is neurovascularly intact.  She remains ambulatory.  X-ray today is negative.  Encouraged to continue ice and elevation, anti-inflammatories.  Recommended Ace wrap for some compression to see if this will help reduce swelling.  She can follow-up with PCP.  She may return here for any new or acute changes.  Final Clinical Impression(s) / ED Diagnoses Final diagnoses:  Foot pain, right    Rx / DC Orders ED Discharge Orders    None       Garlon Hatchet,  PA-C 10/29/20 0151    Ward, Layla Maw, DO 10/29/20 [id number]

## 2020-10-29 NOTE — Discharge Instructions (Addendum)
X-ray today was negative. Continue tylenol and/or motrin for pain. Continue to ice/elevate.  Ace wrap may help keep the swelling down. Follow-up with your doctor if ongoing issues. Return here for new concerns.

## 2020-11-02 ENCOUNTER — Other Ambulatory Visit (HOSPITAL_COMMUNITY): Payer: Self-pay | Admitting: Obstetrics & Gynecology

## 2020-11-02 DIAGNOSIS — R1030 Lower abdominal pain, unspecified: Secondary | ICD-10-CM

## 2020-11-04 ENCOUNTER — Other Ambulatory Visit: Payer: Self-pay | Admitting: Family Medicine

## 2020-11-08 ENCOUNTER — Ambulatory Visit (HOSPITAL_COMMUNITY): Admission: RE | Admit: 2020-11-08 | Payer: BC Managed Care – PPO | Source: Ambulatory Visit

## 2020-11-08 ENCOUNTER — Encounter (HOSPITAL_COMMUNITY): Payer: Self-pay

## 2020-11-09 NOTE — Telephone Encounter (Signed)
Ok to refill??  Last office visit 08/26/2020.  Last refill 09/06/2020.

## 2020-11-17 ENCOUNTER — Ambulatory Visit (HOSPITAL_COMMUNITY)
Admission: RE | Admit: 2020-11-17 | Discharge: 2020-11-17 | Disposition: A | Discharge status: Home / Self Care | Payer: BC Managed Care – PPO | Source: Ambulatory Visit | Attending: Obstetrics & Gynecology | Admitting: Obstetrics & Gynecology

## 2020-11-17 ENCOUNTER — Other Ambulatory Visit: Payer: Self-pay

## 2020-11-17 DIAGNOSIS — R1030 Lower abdominal pain, unspecified: Secondary | ICD-10-CM | POA: Diagnosis present

## 2020-12-24 ENCOUNTER — Ambulatory Visit: Payer: BC Managed Care – PPO | Admitting: Family Medicine

## 2020-12-27 ENCOUNTER — Ambulatory Visit
Admission: RE | Admit: 2020-12-27 | Discharge: 2020-12-27 | Disposition: A | Discharge status: Home / Self Care | Payer: BC Managed Care – PPO | Source: Ambulatory Visit | Attending: Family Medicine | Admitting: Family Medicine

## 2020-12-27 ENCOUNTER — Encounter: Payer: Self-pay | Admitting: Family Medicine

## 2020-12-27 ENCOUNTER — Other Ambulatory Visit: Payer: Self-pay

## 2020-12-27 ENCOUNTER — Ambulatory Visit (INDEPENDENT_AMBULATORY_CARE_PROVIDER_SITE_OTHER): Payer: BC Managed Care – PPO | Admitting: Family Medicine

## 2020-12-27 VITALS — BP 140/88 | HR 98 | Temp 97.9°F | Ht 66.0 in | Wt 156.0 lb

## 2020-12-27 DIAGNOSIS — R109 Unspecified abdominal pain: Secondary | ICD-10-CM

## 2020-12-27 DIAGNOSIS — M549 Dorsalgia, unspecified: Secondary | ICD-10-CM | POA: Diagnosis not present

## 2020-12-27 DIAGNOSIS — R0781 Pleurodynia: Secondary | ICD-10-CM

## 2020-12-27 LAB — COMPLETE METABOLIC PANEL WITH GFR
AG Ratio: 1.1 (calc) (ref 1.0–2.5)
ALT: 38 U/L — ABNORMAL HIGH (ref 6–29)
AST: 26 U/L (ref 10–35)
Albumin: 3.7 g/dL (ref 3.6–5.1)
Alkaline phosphatase (APISO): 74 U/L (ref 37–153)
BUN: 15 mg/dL (ref 7–25)
CO2: 27 mmol/L (ref 20–32)
Calcium: 9.2 mg/dL (ref 8.6–10.4)
Chloride: 109 mmol/L (ref 98–110)
Creat: 0.65 mg/dL (ref 0.50–1.05)
GFR, Est African American: 114 mL/min/{1.73_m2} (ref >=60)
GFR, Est Non African American: 99 mL/min/{1.73_m2} (ref >=60)
Globulin: 3.4 g/dL (calc) (ref 1.9–3.7)
Glucose, Bld: 84 mg/dL (ref 65–99)
Potassium: 3.9 mmol/L (ref 3.5–5.3)
Sodium: 141 mmol/L (ref 135–146)
Total Bilirubin: 0.3 mg/dL (ref 0.2–1.2)
Total Protein: 7.1 g/dL (ref 6.1–8.1)

## 2020-12-27 LAB — CBC WITH DIFFERENTIAL/PLATELET
Absolute Monocytes: 372 cells/uL (ref 200–950)
Basophils Absolute: 30 cells/uL (ref 0–200)
Basophils Relative: 0.8 %
Eosinophils Absolute: 160 cells/uL (ref 15–500)
Eosinophils Relative: 4.2 %
HCT: 34 % — ABNORMAL LOW (ref 35.0–45.0)
Hemoglobin: 11.6 g/dL — ABNORMAL LOW (ref 11.7–15.5)
Lymphs Abs: 1683 cells/uL (ref 850–3900)
MCH: 29.8 pg (ref 27.0–33.0)
MCHC: 34.1 g/dL (ref 32.0–36.0)
MCV: 87.4 fL (ref 80.0–100.0)
MPV: 11.4 fL (ref 7.5–12.5)
Monocytes Relative: 9.8 %
Neutro Abs: 1554 cells/uL (ref 1500–7800)
Neutrophils Relative %: 40.9 %
Platelets: 156 10*3/uL (ref 140–400)
RBC: 3.89 10*6/uL (ref 3.80–5.10)
RDW: 12.6 % (ref 11.0–15.0)
Total Lymphocyte: 44.3 %
WBC: 3.8 10*3/uL (ref 3.8–10.8)

## 2020-12-27 LAB — MICROSCOPIC MESSAGE

## 2020-12-27 LAB — URINALYSIS, ROUTINE W REFLEX MICROSCOPIC
Bacteria, UA: NONE SEEN /HPF
Bilirubin Urine: NEGATIVE
Glucose, UA: NEGATIVE
Hyaline Cast: NONE SEEN /LPF
Ketones, ur: NEGATIVE
Leukocytes,Ua: NEGATIVE
Nitrite: NEGATIVE
Protein, ur: NEGATIVE
Specific Gravity, Urine: 1.026 (ref 1.001–1.03)
WBC, UA: NONE SEEN /HPF (ref 0–5)
pH: 6 (ref 5.0–8.0)

## 2020-12-27 NOTE — Progress Notes (Signed)
Subjective:    Patient ID: Sherry Daniels, female    DOB: [birth year], 60 y.o.   MRN: [id number]  05/24/20 Since I last saw the patient, she has retired.  However she also recently lost her father.  Therefore she is still having to use the Xanax on a nightly basis to help her sleep.  She does not recollect why she stopped Lexapro.  Her sister takes Lexapro.  She does not recall any serious side effects.  She does not remember whether the medication helped her.  At the present time she is only been using Xanax on a daily basis to help her sleep and control the anxiety.  Her blood pressure today is outstanding at 110/80.  She is not taking Klonopin.  The medication list is incorrect.  She did recently see her gynecologist to perform nonfasting lab work.  Apparently her blood sugar was prediabetic and her LDL cholesterol was very high.  She would like to repeat that today to see what her fasting levels are.  She has recently started exercising and is back to running 3 miles a day.  She is also reduce the carbohydrates in her diet.  Diabetes does run in her family as her brother is a prediabetic.  She also has a history of exercise-induced asthma and would like a refill on her albuterol that she uses sparingly for the occasional attack.  At that time,my plan was: I will be glad to refill the patient's albuterol along with her Xanax.  However I explained to the patient that I was concerned that if she uses the medication on a daily basis her body can develop a dependency to it.  Therefore I feel that if we use an SSRI such as Lexapro, we may better manage it so that she not need the other medication is often.  She is willing to try this again.  Her blood pressure today is outstanding.  I would like to repeat her fasting lab work to reassess her blood sugar and her cholesterol.  Recommended less than 45 g of carbohydrates per meal and also 30 minutes of aerobic exercise daily.  Otherwise the patient is  doing well with no concerns  07/08/20 Wt Readings from Last 3 Encounters:  08/26/20 152 lb (68.9 kg)  07/13/20 157 lb 9.6 oz (71.5 kg)  07/08/20 154 lb (69.9 kg)  Patient has lost approximately 9 pounds since her last visit.  She states that the Lexapro is helping.  I recommended a cholesterol pill to help manage her hyperlipidemia as a suspect that this was contributing to her fatty liver disease.  However I wanted to recheck her liver function test 6 weeks after starting the statin.  She is not fasting today and therefore I cannot check her fasting lipid panel however I can certainly follow-up on her liver function test.  The patient recently had a colonoscopy.  Afterwards she reports significant bloating, nausea, and abdominal cramps.  She also has some tenderness to palpation in the right upper quadrant.  She states that she had x-rays in urgent care and also a negative Covid test.  Per her report there was increased air under the diaphragm and in her intestines on the x-ray.  I question if the patient may have had an ileus after her colonoscopy.  She is only have 1 bowel movement.  Today she has sluggish bowel sounds.  She has no guarding or rebound however she is tender to palpation in the right upper quadrant.  We are also due to follow-up on her thrombocytopenia.  She is being seen by rheumatology for mixed connective tissue disorder yet to be determined.  However she does have a history of a butterfly rash as well as a positive ANA.  Therefore lupus is on the differential diagnosis.  Also would potentially be idiopathic thrombocytopenia purpura as well as thrombocytopenia related to underlying liver inflammation.  At that time, my plan was: Patient's abdominal pain is improving 1 week out from her colonoscopy.  She denies any further fevers.  I have recommended that she take a stimulant laxative if she has only had 1 bowel movement in the last week.  I recommended that she push Gatorade to avoid  dehydration.  Meanwhile hold both blood pressure medicines hydrochlorothiazide and amlodipine until systolic blood pressures greater than 140 as she appears dehydrated.  Meanwhile check liver function test to ensure that the statin medication is not aggravating her fatty liver disease.  Also check a CBC to monitor her thrombocytopenia.  Patient has an appointment in the next month to see her rheumatologist to determine the type of connective tissue disorder that she may have.  Differential diagnosis for her low platelets includes autoimmune disease such as lupus, ITP, or mild thrombocytopenia related to liver dysfunction.  Is also possible that her low platelet count was a spurious one-time finding.  Therefore I want to repeat it today  08/26/20 Patient has a history of IBS and chronic constipation.  She states that Linzess will work for a few days and then she will not have a bowel movement for 3 to 4 days and then it will work again.  Therefore she does not feel that the Linzess is effective.  Her gastroenterologist, Dr. Collene Mares, recently gave her [place].  She took 1 dose of it.  She then developed nausea.  She began to become diaphoretic.  She began to develop crampy abdominal pain.  The pain became so bad that she called EMS.  When EMS arrived, they stated that her blood pressure was dropping.  She was orthostatic.  When she lied down the symptoms improved however she continued to have crampy abdominal pain.  She refused to go to the emergency room.  Afterward she began to defecate and her pain resolved.  I suspect that she had a vasovagal reaction either due to pain or possibly due to straining with defecation causing her blood pressure to drop.  However the event seems to be triggered on constipation.  Patient is trying to get a second opinion with a different gastroenterologist.  She would like to try different medication for chronic constipation.  At that time, my plan was: We will trial Amitiza 24 mcg  p.o. twice daily to see if this will help with her chronic constipation.  She is already tried and failed Dulcolax, Linzess, Motegrity.  Patient is arranging her own second opinion with a different gastroenterologist  12/27/20 Patient is a very pleasant 60 year old African-American female who presents today with right flank pain.  She estimates that is been going on for about a month or 2.  The pain is constantly there.  She feels like it is around the ribs in the axillary line.  She describes it as an aching pain.  It does not radiate.  It does not move to other areas.  There are no definite exacerbating or alleviating factors.  She denies any pain with movement.  Rolling over in bed does not intensify the pain.  Coughing does not  intensify the pain.  She denies any true pleurisy.  She does state that she has some mild shortness of breath however she denies any hemoptysis or sharp pain in that area when she takes a deep breath in.  She denies any gross hematuria or dysuria or frequency or urgency.  We did perform a urinalysis today that shows trace amount of blood but otherwise no nitrates and no leukocyte esterase.  She does have occasional vaginal discharge however she has a hysterectomy.  She recently saw gynecologist and her gynecologic exam was normal.  To her knowledge there are no ovarian masses.  She does have a history of fatty liver disease however I do not believe that fatty liver disease would cause pain.  She also has a history of fibromyalgia and was recently diagnosed with Sjogren's syndrome.  To my knowledge there is no history of autoimmune hepatitis.  She denies any fevers or chills.  There is no nausea or vomiting or diarrhea.  She does occasionally have constipation.  On exam today, there is no visible rash in the back.  There is no erythema or swelling that I can palpate or see with the neck and I.  The pain seems to be centered around the right 10th rib in the axillary line.  The patient  feels like she can feel a lump on that rib.  I do not appreciate a lump or any palpable abnormality today however I do localize the pain to that area.  There is no shingles-like rash in that area.  She denies any radiating nervelike pain in that area again the pain is constant and therefore I think biliary colic is unlikely.  Past Medical History:  Diagnosis Date  . Allergy   . Asthma   . Chest pain 11/2009   overnight hospitalization  . Constipation    Dr. Collene Mares  . Fibromyalgia   . GERD (gastroesophageal reflux disease)   . Hyperlipidemia   . Hypertension   . Migraine   . Nonalcoholic fatty liver disease    Past Surgical History:  Procedure Laterality Date  . ABDOMINAL HYSTERECTOMY     total  . COLONOSCOPY  09/15/2009   Dr. Collene Mares; normal  . WISDOM TOOTH EXTRACTION     Current Outpatient Medications on File Prior to Visit  Medication Sig Dispense Refill  . acetaminophen (TYLENOL) 500 MG tablet Take 1,500 mg by mouth every 6 (six) hours as needed. Reported on 05/01/2016    . albuterol (VENTOLIN HFA) 108 (90 Base) MCG/ACT inhaler USE 2 PUFFS EVERY 4 TO 6 HOURS AS NEEDED 18 g 2  . ALPRAZolam (XANAX) 0.5 MG tablet TAKE 1 TABLET (0.5 MG TOTAL) BY MOUTH 3 (THREE) TIMES DAILY AS NEEDED FOR ANXIETY. 30 tablet 0  . aspirin 81 MG EC tablet 1 tablet po qOD (Patient taking differently: Take 81 mg by mouth daily. ) 30 tablet 12  . atorvastatin (LIPITOR) 20 MG tablet Take 1 tablet (20 mg total) by mouth daily. 90 tablet 2  . diclofenac sodium (VOLTAREN) 1 % GEL Apply 2 g topically 4 (four) times daily. 100 g 3  . eletriptan (RELPAX) 40 MG tablet TAKE 1 TAB AT ONSET OF HEADACHE.MAY REPEAT IN 2HRS IF HEADACHE PERSISTS 10 tablet 2  . escitalopram (LEXAPRO) 10 MG tablet TAKE 1 TABLET BY MOUTH EVERY DAY 90 tablet 2  . estradiol (VIVELLE-DOT) 0.1 MG/24HR patch APPLY 1 NEW PATCH TWICE WEEKLY  3  . hydrochlorothiazide (MICROZIDE) 12.5 MG capsule TAKE 1 CAPSULE BY MOUTH  EVERY DAY 90 capsule 1  . ibuprofen  (ADVIL,MOTRIN) 600 MG tablet Take 1 tablet (600 mg total) by mouth every 6 (six) hours as needed. 30 tablet 0  . linaclotide (LINZESS) 72 MCG capsule Take 72 mcg by mouth daily before breakfast.    . lubiprostone (AMITIZA) 24 MCG capsule Take 1 capsule (24 mcg total) by mouth 2 (two) times daily with a meal. 60 capsule 1  . Misc Natural Products (TART CHERRY ADVANCED PO) Take by mouth.    . Multiple Vitamins-Minerals (MULTIVITAMIN WITH MINERALS) tablet Take 1 tablet by mouth daily.    . Omega-3 1000 MG CAPS Take by mouth.    . phentermine (ADIPEX-P) 37.5 MG tablet phentermine 37.5 mg tablet  TAKE 1 TABLET EVERY DAY BY ORAL ROUTE IN THE MORNING.    . promethazine (PHENERGAN) 12.5 MG tablet Take 1 tablet (12.5 mg total) by mouth every 8 (eight) hours as needed for nausea or vomiting. 20 tablet 0  . topiramate (TOPAMAX) 25 MG tablet TAKE 2 TABLETS (50 MG TOTAL) BY MOUTH 2 (TWO) TIMES DAILY. 120 tablet 3  . TURMERIC PO Take by mouth.     No current facility-administered medications on file prior to visit.   Allergies  Allergen Reactions  . Aspirin Nausea Only  . Codeine Nausea Only  . Cymbalta [Duloxetine Hcl]     Felt like it worsened anxiety and fibromyalgia  . Simvastatin Other (See Comments)    dizziness   Social History   Socioeconomic History  . Marital status: Married    Spouse name: Not on file  . Number of children: Not on file  . Years of education: Not on file  . Highest education level: Not on file  Occupational History  . 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 Smoking status: Never Smoker  . Smokeless tobacco: Never Used  Vaping Use  . Vaping Use: Never used  Substance and Sexual Activity  . Alcohol use: No    Alcohol/week: 0.0 standard drinks  . Drug use: No  . Sexual activity: Yes    Birth control/protection: Surgical  Other Topics Concern  . Not on file  Social History Narrative   Married, 1  daughter and 1 son, pentecostal, exercise most days, walking, biking, running.    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      Objective:   Physical Exam Vitals reviewed.  Constitutional:      General: She is not in acute distress.    Appearance: She is well-developed. She is not diaphoretic.  Cardiovascular:     Rate and Rhythm: Normal rate and regular rhythm.     Heart sounds: Normal heart sounds.  Pulmonary:     Effort: Pulmonary effort is normal. No respiratory distress.     Breath sounds: Normal breath sounds.  Abdominal:     General: Abdomen is flat. There is no distension. There are no signs of injury.     Tenderness: There is abdominal tenderness in the right lower quadrant and left lower quadrant.    Neurological:  Mental Status: She is alert and oriented to person, place, and time.     Cranial Nerves: No cranial nerve deficit.     Motor: No abnormal muscle tone.     Coordination: Coordination normal.        Assessment & Plan:  Right-sided back pain, unspecified back location, unspecified chronicity - Plan: Urinalysis, Routine w reflex microscopic  Rib pain on right side - Plan: DG Ribs Unilateral Right, CBC with Differential/Platelet, COMPLETE METABOLIC PANEL WITH GFR  Right lateral abdominal pain   Urinalysis shows trace amount of blood but is otherwise unremarkable.  I do not feel that this trace amount of blood is related to the pain that she is having.  The symptoms do not sound like a kidney stone although this is certainly on the differential if it was a small stone that is failing to pass.  Renal cancer is unlikely.   Symptoms do not sound like a bladder infection or kidney infection.  I also do not think that the pain is related to fatty liver disease.  The symptoms are too constant to represent biliary colic in my opinion.  They also do not sound intestinal in nature.  Therefore I favor a musculoskeletal cause.  Begin by obtaining an x-ray of the ribs on the right side.  Also check a CBC to look for leukocytosis, check an alkaline phosphatase to look for increased bone turnover, check a total protein level to evaluate for elevated total protein that would suggest something such as multiple myeloma.  Check a CMP to monitor liver function test.  If x-ray and labs are normal, I would favor a muscle etiology of her pain.  However if the pain intensifies, I would recommend a CAT scan to evaluate for the source of the hematuria is a small kidney stone could potentially cause a right flank pain that persists

## 2020-12-28 NOTE — Progress Notes (Deleted)
Office Visit Note  Patient: Sherry Daniels             Date of Birth: January 02, 1961           MRN: [id number]             PCP: Donita Brooks, MD Referring: Donita Brooks, MD Visit Date: 01/11/2021 Occupation: @GUAROCC @  Subjective:  No chief complaint on file.   History of Present Illness: Sherry Daniels is a 60 y.o. female ***   Activities of Daily Living:  Patient reports morning stiffness for *** {minute/hour:19697}.   Patient {ACTIONS;DENIES/REPORTS:21021675::"Denies"} nocturnal pain.  Difficulty dressing/grooming: {ACTIONS;DENIES/REPORTS:21021675::"Denies"} Difficulty climbing stairs: {ACTIONS;DENIES/REPORTS:21021675::"Denies"} Difficulty getting out of chair: {ACTIONS;DENIES/REPORTS:21021675::"Denies"} Difficulty using hands for taps, buttons, cutlery, and/or writing: {ACTIONS;DENIES/REPORTS:21021675::"Denies"}  No Rheumatology ROS completed.   PMFS History:  Patient Active Problem List   Diagnosis Date Noted  . Sicca complex (HCC) 02/18/2018  . History of migraine 02/18/2018  . History of pleurisy 02/18/2018  . 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 Migraines 01/29/2017  . Anemia 11/24/2011  . Headache(784.0) 11/24/2011  . Nonalcoholic fatty liver disease 11/08/2011  . Essential hypertension, benign 10/12/2011  . Hyperlipidemia 10/12/2011  . GERD (gastroesophageal reflux disease) 10/12/2011  . Fibromyalgia 10/12/2011  . Polyarthralgia 10/12/2011  . Allergy     Past Medical History:  Diagnosis Date  . Allergy   . Asthma   . Chest pain 11/2009   overnight hospitalization  . Constipation    Dr. 12/2009  . Fibromyalgia   . GERD (gastroesophageal reflux disease)   . Hyperlipidemia   . Hypertension   . Migraine   . Nonalcoholic fatty liver disease     Family History  Problem Relation Age of Onset  . Cancer Mother        stage III, colon  . Hypertension Mother   . Dementia Mother   . Heart disease Mother        bradycardia  .  Cancer Father        prostate  . Hypertension Father   . Hyperlipidemia Father   . Heart disease Father 33       MI   . Hyperlipidemia Sister   . Hypertension Sister   . Fibromyalgia Sister   . Arthritis Brother        RA  . Hypertension Brother   . Hyperlipidemia Brother   . Fibromyalgia Brother   . Hypertension Brother   . Hyperlipidemia Brother   . Healthy Daughter   . Healthy Son   . Lupus Niece    Past Surgical History:  Procedure Laterality Date  . ABDOMINAL HYSTERECTOMY     total  . COLONOSCOPY  09/15/2009   Dr. 13/01/2009; normal  . WISDOM TOOTH EXTRACTION     Social History   Social History Narrative   Married, 1 daughter and 1 son, pentecostal, exercise most days, walking, biking, running.    [address] for [employer], academic region of [employer] History  Administered Date(s) Administered  . PFIZER(Purple Top)SARS-COV-2 Vaccination 03/22/2020, 04/12/2020     Objective: Vital Signs: There were no vitals taken for this visit.   Physical Exam   Musculoskeletal Exam: ***  CDAI Exam: CDAI Score: -- Patient Global: --; Provider Global: -- Swollen: --; Tender: -- Joint Exam 01/11/2021   No joint exam has been documented for this visit   There is currently no information documented on the homunculus. Go to the Rheumatology activity and complete the homunculus joint exam.  Investigation:  No additional findings.  Imaging: DG Ribs Unilateral Right  Result Date: 12/28/2020 CLINICAL DATA:  Right lower posterior rib pain EXAM: RIGHT RIBS - 2 VIEW COMPARISON:  None. FINDINGS: Radio-opaque marker has been placed on the skin at the site of patient concern. No evidence for an acute displaced right-sided rib fracture. No evidence for right pneumothorax or pleural effusion. IMPRESSION: Negative. Electronically Signed   By: Kennith Center M.D.   On: 12/28/2020 14:55    Recent Labs: Lab Results  Component Value Date   WBC 3.8 12/27/2020   HGB 11.6 (L) 12/27/2020    PLT 156 12/27/2020   NA 141 12/27/2020   K 3.9 12/27/2020   CL 109 12/27/2020   CO2 27 12/27/2020   GLUCOSE 84 12/27/2020   BUN 15 12/27/2020   CREATININE 0.65 12/27/2020   BILITOT 0.3 12/27/2020   ALKPHOS 66 03/29/2017   AST 26 12/27/2020   ALT 38 (H) 12/27/2020   PROT 7.1 12/27/2020   ALBUMIN 3.6 03/29/2017   CALCIUM 9.2 12/27/2020   GFRAA 114 12/27/2020    Speciality Comments: No specialty comments available.  Procedures:  No procedures performed Allergies: Aspirin, Codeine, Cymbalta [duloxetine hcl], and Simvastatin   Assessment / Plan:     Visit Diagnoses: No diagnosis found.  Orders: No orders of the defined types were placed in this encounter.  No orders of the defined types were placed in this encounter.   Face-to-face time spent with patient was *** minutes. Greater than 50% of time was spent in counseling and coordination of care.  Follow-Up Instructions: No follow-ups on file.   Ellen Henri, CMA  Note - This record has been created using Animal nutritionist.  Chart creation errors have been sought, but may not always  have been located. Such creation errors do not reflect on  the standard of medical care.

## 2020-12-30 ENCOUNTER — Other Ambulatory Visit: Payer: Self-pay | Admitting: *Deleted

## 2020-12-30 MED ORDER — TIZANIDINE HCL 4 MG PO TABS: 4.0000 mg | ORAL_TABLET | Freq: Three times a day (TID) | ORAL | 0 refills | Status: DC | PRN

## 2021-01-11 ENCOUNTER — Ambulatory Visit: Payer: BC Managed Care – PPO | Admitting: Rheumatology

## 2021-01-11 DIAGNOSIS — M3501 Sicca syndrome with keratoconjunctivitis: Secondary | ICD-10-CM

## 2021-01-11 DIAGNOSIS — Z8709 Personal history of other diseases of the respiratory system: Secondary | ICD-10-CM

## 2021-01-11 DIAGNOSIS — M797 Fibromyalgia: Secondary | ICD-10-CM

## 2021-01-11 DIAGNOSIS — M2241 Chondromalacia patellae, right knee: Secondary | ICD-10-CM

## 2021-01-11 DIAGNOSIS — Z8669 Personal history of other diseases of the nervous system and sense organs: Secondary | ICD-10-CM

## 2021-01-11 DIAGNOSIS — K219 Gastro-esophageal reflux disease without esophagitis: Secondary | ICD-10-CM

## 2021-01-11 DIAGNOSIS — I10 Essential (primary) hypertension: Secondary | ICD-10-CM

## 2021-01-11 DIAGNOSIS — M2550 Pain in unspecified joint: Secondary | ICD-10-CM

## 2021-01-11 DIAGNOSIS — K760 Fatty (change of) liver, not elsewhere classified: Secondary | ICD-10-CM

## 2021-01-11 DIAGNOSIS — E7800 Pure hypercholesterolemia, unspecified: Secondary | ICD-10-CM

## 2021-04-17 ENCOUNTER — Other Ambulatory Visit: Payer: Self-pay | Admitting: Family Medicine

## 2021-04-18 NOTE — Telephone Encounter (Signed)
Ok to refill??  Last office visit 12/27/2020.  Last refill 11/09/2020.

## 2021-04-19 NOTE — Progress Notes (Signed)
Office Visit Note  Patient: Sherry Daniels             Date of Birth: Apr 11, 1961           MRN: [id number]             PCP: Donita Brooks, MD Referring: Donita Brooks, MD Visit Date: 05/03/2021 Occupation: @GUAROCC @  Subjective:  Other (Patient reports recent flares of rashes on face, arms, behind knees)   History of Present Illness: Sherry Daniels is a 60 y.o. female with history of sicca symptoms.  She continues to have dry eyes and dry mouth symptoms.  She also has frequent rashes on her arms and lower extremities.  She has noticed some dark areas under her eyes.  She has been seen by an ophthalmologist and was given some eyedrops which has been helpful.  She continues to have discomfort in her knee joints.  She also complains of fatigue and generalized pain from fibromyalgia.  Activities of Daily Living:  Patient reports morning stiffness for 30-60 minutes.   Patient Reports nocturnal pain.  Difficulty dressing/grooming: Denies Difficulty climbing stairs: Denies Difficulty getting out of chair: Reports Difficulty using hands for taps, buttons, cutlery, and/or writing: Denies  Review of Systems  Constitutional:  Positive for fatigue.  HENT:  Positive for mouth sores, mouth dryness and nose dryness.   Eyes:  Positive for pain, itching and dryness.  Respiratory:  Positive for shortness of breath and difficulty breathing.   Cardiovascular:  Positive for palpitations. Negative for chest pain.  Gastrointestinal:  Positive for constipation and diarrhea. Negative for blood in stool.  Endocrine: Negative for increased urination.  Genitourinary:  Positive for painful urination. Negative for difficulty urinating.  Musculoskeletal:  Positive for joint pain, joint pain, joint swelling, myalgias, morning stiffness, muscle tenderness and myalgias.  Skin:  Positive for rash. Negative for color change.  Allergic/Immunologic: Positive for susceptible to infections.   Neurological:  Positive for numbness and headaches. Negative for dizziness and memory loss.  Hematological:  Positive for bruising/bleeding tendency.  Psychiatric/Behavioral:  Negative for confusion.    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 12/2009   Fibromyalgia    GERD (gastroesophageal reflux disease)    Hyperlipidemia    Hypertension    Migraine    Nonalcoholic fatty liver disease     Family History  Problem Relation Age of Onset   Cancer Mother        stage III, colon   Hypertension Mother    Dementia Mother    Heart disease Mother        bradycardia   Cancer Father        prostate   Hypertension Father    Hyperlipidemia Father    Heart disease Father 81       MI    Hyperlipidemia Sister  Hypertension Sister    Fibromyalgia Sister    Arthritis Brother        RA   Hypertension Brother    Hyperlipidemia Brother    Fibromyalgia Brother    Hypertension Brother    Hyperlipidemia Brother    Healthy Daughter    Healthy Son    Lupus Niece    Past Surgical History:  Procedure Laterality Date   ABDOMINAL HYSTERECTOMY     total   COLONOSCOPY  09/15/2009   Dr. Loreta Ave; normal   WISDOM TOOTH EXTRACTION     Social History   Social History Narrative   Married, 1 daughter and 1 son, pentec[employer]al, exercise most days, walking, biking, running.    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  Constitutional:      Appearance: She is well-developed.  HENT:     Head: Normocephalic and atraumatic.  Eyes:     Conjunctiva/sclera: Conjunctivae normal.  Cardiovascular:     Rate and Rhythm: Normal rate and regular rhythm.     Heart sounds: Normal heart sounds.  Pulmonary:     Effort: Pulmonary effort is normal.     Breath sounds: Normal breath sounds.  Abdominal:     General: Bowel sounds are normal.     Palpations: Abdomen is soft.  Musculoskeletal:     Cervical back: Normal range of motion.  Lymphadenopathy:     Cervical: No cervical adenopathy.  Skin:    General: Skin is warm and dry.     Capillary Refill: Capillary refill takes less than 2 seconds.     Comments: She had dry skin between her shoulder blades consistent with eczema.  No rash was noted.  She has hyperpigmentation under her eyes.  No rash was noted.  Neurological:     Mental Status: She is alert and oriented to person, place, and time.  Psychiatric:        Behavior: Behavior normal.     Musculoskeletal Exam: C-spine thoracic and lumbar spine were in good range of motion.  Shoulder joints, elbow joints, wrist joints, MCPs PIPs and DIPs with good range of motion with no synovitis.  Hip joints, knee joints, ankles, MTPs and PIPs with good range of motion with no synovitis.  CDAI Exam: CDAI Score: -- Patient Global: --; Provider Global: -- Swollen: --; Tender: -- Joint Exam 05/03/2021   No joint exam has been documented for this visit   There is currently no information documented on the  homunculus. Go to the Rheumatology activity and complete the homunculus joint exam.  Investigation: No additional findings.  Imaging: No results found.  Recent Labs: Lab Results  Component Value Date   WBC 3.8 12/27/2020   HGB 11.6 (L) 12/27/2020   PLT 156 12/27/2020   NA 141 12/27/2020   K 3.9 12/27/2020   CL 109 12/27/2020   CO2 27 12/27/2020   GLUCOSE 84 12/27/2020   BUN 15 12/27/2020   CREATININE 0.65 12/27/2020   BILITOT 0.3 12/27/2020   ALKPHOS 66 03/29/2017   AST 26 12/27/2020   ALT 38 (  H) 12/27/2020   PROT 7.1 12/27/2020   ALBUMIN 3.6 03/29/2017   CALCIUM 9.2 12/27/2020   GFRAA 114 12/27/2020    Speciality Comments: No specialty comments available.  Procedures:  No procedures performed Allergies: Aspirin, Codeine, Cymbalta [duloxetine hcl], and Simvastatin   Assessment / Plan:     Visit Diagnoses: Sicca complex (HCC) - ANA, SSA, SSB antibodies were all negative, RF negative.  Complete ENA panel was negative.  She initially had low titer ANA, C1q positive.  She also gives history of sicca symptoms.  I detailed discussion with the patient.  Based on the new criteria she does not meet the diagnosis of Sjogren's.  Sjogren's can be confirmed with the lip biopsy or parotid ultrasound.  Her symptoms are not that severe at this point.  Some of the medications could be contributing to sicca's complex.  I will check autoimmune antibodies again today.- Plan: CBC with Differential/Platelet, ANA, Anti-DNA antibody, double-stranded, C3 and C4, Sedimentation rate  Chondromalacia of both patellae-she continues to have some discomfort in her knee joints.  Elevated liver enzymes -her LFTs were elevated before.  We will check labs today.  Plan: COMPLETE METABOLIC PANEL WITH GFR  Fibromyalgia-she has generalized pain and discomfort from fibromyalgia.  She has positive tender points.  Essential hypertension-patient's blood pressure is elevated.  She states she did not take her blood  pressure medication today.  We had detailed discussion regarding the risk of hypertension.  She also complains of palpitations.  I will refer her to cardiology for evaluation.  She was advised to take medications on a regular basis and monitor her blood pressure.  History of migraine  Nonalcoholic fatty liver disease  Pure hypercholesterolemia  Gastroesophageal reflux disease without esophagitis  History of pleurisy - 2013   Orders: Orders Placed This Encounter  Procedures   CBC with Differential/Platelet   COMPLETE METABOLIC PANEL WITH GFR   ANA   Anti-DNA antibody, double-stranded   C3 and C4   Sedimentation rate    No orders of the defined types were placed in this encounter.    Follow-Up Instructions: Return in about 5 months (around 10/03/2021) for sicca.   Pollyann Savoy, MD  Note - This record has been created using Animal nutrition[employer].  Chart creation errors have been sought, but may not always  have been located. Such creation errors do not reflect on  the standard of medical care.

## 2021-04-19 NOTE — Telephone Encounter (Signed)
PDMP reviewed.  Refill given in place of PCP who is out of office. 

## 2021-05-03 ENCOUNTER — Ambulatory Visit: Payer: BC Managed Care – PPO | Admitting: Rheumatology

## 2021-05-03 ENCOUNTER — Other Ambulatory Visit: Payer: Self-pay

## 2021-05-03 ENCOUNTER — Encounter: Payer: Self-pay | Admitting: Rheumatology

## 2021-05-03 VITALS — BP 160/84 | HR 65 | Resp 13 | Ht 66.0 in | Wt 163.6 lb

## 2021-05-03 DIAGNOSIS — M3500 Sicca syndrome, unspecified: Secondary | ICD-10-CM | POA: Diagnosis not present

## 2021-05-03 DIAGNOSIS — M797 Fibromyalgia: Secondary | ICD-10-CM

## 2021-05-03 DIAGNOSIS — K219 Gastro-esophageal reflux disease without esophagitis: Secondary | ICD-10-CM

## 2021-05-03 DIAGNOSIS — M2550 Pain in unspecified joint: Secondary | ICD-10-CM

## 2021-05-03 DIAGNOSIS — E7800 Pure hypercholesterolemia, unspecified: Secondary | ICD-10-CM

## 2021-05-03 DIAGNOSIS — Z8709 Personal history of other diseases of the respiratory system: Secondary | ICD-10-CM

## 2021-05-03 DIAGNOSIS — K760 Fatty (change of) liver, not elsewhere classified: Secondary | ICD-10-CM

## 2021-05-03 DIAGNOSIS — M3501 Sicca syndrome with keratoconjunctivitis: Secondary | ICD-10-CM

## 2021-05-03 DIAGNOSIS — M2241 Chondromalacia patellae, right knee: Secondary | ICD-10-CM | POA: Diagnosis not present

## 2021-05-03 DIAGNOSIS — Z8669 Personal history of other diseases of the nervous system and sense organs: Secondary | ICD-10-CM

## 2021-05-03 DIAGNOSIS — I10 Essential (primary) hypertension: Secondary | ICD-10-CM

## 2021-05-03 DIAGNOSIS — R748 Abnormal levels of other serum enzymes: Secondary | ICD-10-CM

## 2021-05-03 DIAGNOSIS — M2242 Chondromalacia patellae, left knee: Secondary | ICD-10-CM

## 2021-05-06 LAB — ANTI-NUCLEAR AB-TITER (ANA TITER)
ANA TITER: 1:80 {titer} — ABNORMAL HIGH
ANA Titer 1: 1:320 {titer} — ABNORMAL HIGH

## 2021-05-06 LAB — COMPLETE METABOLIC PANEL WITH GFR
AG Ratio: 1.1 (calc) (ref 1.0–2.5)
ALT: 24 U/L (ref 6–29)
AST: 22 U/L (ref 10–35)
Albumin: 3.8 g/dL (ref 3.6–5.1)
Alkaline phosphatase (APISO): 79 U/L (ref 37–153)
BUN: 14 mg/dL (ref 7–25)
CO2: 28 mmol/L (ref 20–32)
Calcium: 9.2 mg/dL (ref 8.6–10.4)
Chloride: 104 mmol/L (ref 98–110)
Creat: 0.58 mg/dL (ref 0.50–1.05)
GFR, Est African American: 119 mL/min/{1.73_m2} (ref >=60)
GFR, Est Non African American: 102 mL/min/{1.73_m2} (ref >=60)
Globulin: 3.5 g/dL (calc) (ref 1.9–3.7)
Glucose, Bld: 91 mg/dL (ref 65–99)
Potassium: 4.5 mmol/L (ref 3.5–5.3)
Sodium: 139 mmol/L (ref 135–146)
Total Bilirubin: 0.2 mg/dL (ref 0.2–1.2)
Total Protein: 7.3 g/dL (ref 6.1–8.1)

## 2021-05-06 LAB — CBC WITH DIFFERENTIAL/PLATELET
Absolute Monocytes: 326 cells/uL (ref 200–950)
Basophils Absolute: 38 cells/uL (ref 0–200)
Basophils Relative: 0.8 %
Eosinophils Absolute: 317 cells/uL (ref 15–500)
Eosinophils Relative: 6.6 %
HCT: 37.4 % (ref 35.0–45.0)
Hemoglobin: 12 g/dL (ref 11.7–15.5)
Lymphs Abs: 1685 cells/uL (ref 850–3900)
MCH: 28.3 pg (ref 27.0–33.0)
MCHC: 32.1 g/dL (ref 32.0–36.0)
MCV: 88.2 fL (ref 80.0–100.0)
MPV: 12.3 fL (ref 7.5–12.5)
Monocytes Relative: 6.8 %
Neutro Abs: 2434 cells/uL (ref 1500–7800)
Neutrophils Relative %: 50.7 %
Platelets: 111 10*3/uL — ABNORMAL LOW (ref 140–400)
RBC: 4.24 10*6/uL (ref 3.80–5.10)
RDW: 13.1 % (ref 11.0–15.0)
Total Lymphocyte: 35.1 %
WBC: 4.8 10*3/uL (ref 3.8–10.8)

## 2021-05-06 LAB — C3 AND C4
C3 Complement: 144 mg/dL (ref 83–193)
C4 Complement: 25 mg/dL (ref 15–57)

## 2021-05-06 LAB — ANA: Anti Nuclear Antibody (ANA): POSITIVE — AB

## 2021-05-06 LAB — ANTI-DNA ANTIBODY, DOUBLE-STRANDED: ds DNA Ab: <1 IU/mL

## 2021-05-06 LAB — SEDIMENTATION RATE: Sed Rate: 22 mm/h (ref 0–30)

## 2021-05-06 NOTE — Progress Notes (Signed)
CBC is normal, CMP is normal, ANA is low titer positive, double-stranded DNA is negative, complements normal, sed rate is normal.  Labs do not indicate an active disease.  No change in treatment advised.

## 2021-06-16 ENCOUNTER — Other Ambulatory Visit: Payer: Self-pay | Admitting: Family Medicine

## 2021-06-16 DIAGNOSIS — I10 Essential (primary) hypertension: Secondary | ICD-10-CM

## 2021-06-30 ENCOUNTER — Ambulatory Visit: Payer: BC Managed Care – PPO | Admitting: Cardiovascular Disease

## 2021-07-26 ENCOUNTER — Other Ambulatory Visit: Payer: Self-pay | Admitting: Obstetrics & Gynecology

## 2021-07-26 DIAGNOSIS — R928 Other abnormal and inconclusive findings on diagnostic imaging of breast: Secondary | ICD-10-CM

## 2021-07-28 ENCOUNTER — Other Ambulatory Visit: Payer: Self-pay | Admitting: Otolaryngology

## 2021-07-28 DIAGNOSIS — J329 Chronic sinusitis, unspecified: Secondary | ICD-10-CM

## 2021-08-16 ENCOUNTER — Ambulatory Visit
Admission: RE | Admit: 2021-08-16 | Discharge: 2021-08-16 | Disposition: A | Discharge status: Home / Self Care | Payer: BC Managed Care – PPO | Source: Ambulatory Visit | Attending: Obstetrics & Gynecology | Admitting: Obstetrics & Gynecology

## 2021-08-16 ENCOUNTER — Other Ambulatory Visit: Payer: Self-pay

## 2021-08-16 DIAGNOSIS — R928 Other abnormal and inconclusive findings on diagnostic imaging of breast: Secondary | ICD-10-CM

## 2021-09-04 NOTE — Progress Notes (Signed)
Cardiology Office Note:   Date:  09/05/2021  NAME:  Sherry Daniels    MRN: [id number] DOB:  July 04, 1961   PCP:  Donita Brooks, MD  Cardiologist:  None  Electrophysiologist:  None   Referring MD: Pollyann Savoy, MD   Chief Complaint  Patient presents with   Shortness of Breath   Palpitations    History of Present Illness:   Sherry Daniels is a 60 y.o. female with a hx of sicca, HTN, HLD, fibromyalgia who is being seen today for the evaluation of palpitations at the request of Sherry Ferrier T, MD. she reports for the last 3 to 4 months has had episodes of chest tightness, palpitations and shortness of breath.  Chest tightness can occur with exertion or with rest.  She also reports activity can get winded.  She reports this is not normal for her.  She also reports episodes of palpitations.  They can occur 2-3 times per week.  They can occur at night or with activity.  She reports they can last up to 1 minute.  Symptoms can occur together.  She has no evidence of congestive heart failure.  Her EKG is normal in office today.  Blood pressure was elevated when she saw her rheumatologist.  She reports she has had difficulties with fluctuations in blood pressure.  BP 138/94.  She takes Norvasc and HCTZ.  She does have fatty liver disease.  She is on aspirin and statin.  Needs repeat lipid profile.  Most recent LDL 175.  HDL 78.  She does have a very strong family history of heart disease.  Father had heart disease in his 68s.  Also maternal grandfather in his 70s.  She personally has never had heart attack or stroke.  Main CVD risk factors include hypertension and fatty liver disease.  She does not smoke.  No alcohol or drug use is reported.  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.  She has 2 children.  Overall seems to be doing well.  Symptoms are bothersome and she is concerned about her heart.  She does have a desire to go on weight loss medications.  I  informed her this is okay.  Problem List Sicca complex HTN HLD -Daniels chol 269, HDL 78, LDL 175, TG 67 Fibromyalgia IBS  Past Medical History: Past Medical History:  Diagnosis Date   Allergy    Asthma    Chest pain 11/2009   overnight hospitalization   Constipation    Dr. Loreta Ave   Fibromyalgia    GERD (gastroesophageal reflux disease)    Hyperlipidemia    Hypertension    Migraine    Nonalcoholic fatty liver disease     Past Surgical History: Past Surgical History:  Procedure Laterality Date   ABDOMINAL HYSTERECTOMY     total   COLONOSCOPY  09/15/2009   Dr. Loreta Ave; normal   WISDOM TOOTH EXTRACTION      Current Medications: Current Meds  Medication Sig   acetaminophen (TYLENOL) 500 MG tablet Take 1,500 mg by mouth every 6 (six) hours as needed. Reported on 05/01/2016   albuterol (VENTOLIN HFA) 108 (90 Base) MCG/ACT inhaler USE 2 PUFFS EVERY 4 TO 6 HOURS AS NEEDED   amLODipine (NORVASC) 5 MG tablet TAKE 1 TABLET BY MOUTH EVERY DAY   aspirin 81 MG EC tablet 1 tablet po qOD (Patient taking differently: Take 81 mg by mouth daily.)   atorvastatin (LIPITOR) 20 MG tablet Take 1 tablet (20 mg total)  by mouth daily.   diclofenac sodium (VOLTAREN) 1 % GEL Apply 2 g topically 4 (four) times daily.   escitalopram (LEXAPRO) 10 MG tablet TAKE 1 TABLET BY MOUTH EVERY DAY   hydrochlorothiazide (MICROZIDE) 12.5 MG capsule TAKE 1 CAPSULE BY MOUTH EVERY DAY   ibuprofen (ADVIL,MOTRIN) 600 MG tablet Take 1 tablet (600 mg total) by mouth every 6 (six) hours as needed.   linaclotide (LINZESS) 72 MCG capsule Take 72 mcg by mouth daily before breakfast.   metoprolol tartrate (LOPRESSOR) 100 MG tablet Take 1 tablet by mouth once for procedure.   Misc Natural Products (TART CHERRY ADVANCED PO) Take by mouth.   Multiple Vitamins-Minerals (MULTIVITAMIN WITH MINERALS) tablet Take 1 tablet by mouth daily.   Omega-3 1000 MG CAPS Take by mouth.   promethazine (PHENERGAN) 12.5 MG tablet Take 1 tablet (12.5  mg total) by mouth every 8 (eight) hours as needed for nausea or vomiting.   topiramate (TOPAMAX) 25 MG tablet TAKE 2 TABLETS (50 MG TOTAL) BY MOUTH 2 (TWO) TIMES DAILY.   TURMERIC PO Take by mouth.   [DISCONTINUED] ALPRAZolam (XANAX) 0.5 MG tablet TAKE 1 TABLET (0.5 MG TOTAL) BY MOUTH 3 (THREE) TIMES DAILY AS NEEDED FOR ANXIETY.   [DISCONTINUED] estradiol (VIVELLE-DOT) 0.1 MG/24HR patch APPLY 1 NEW PATCH TWICE WEEKLY     Allergies:    Aspirin, Codeine, Cymbalta [duloxetine hcl], and Simvastatin   Social History: Social History   Socioeconomic History   Marital status: Married    Spouse name: Not on file   Number of children: 2   Years of education: Not on file   Highest education level: Not on file  Occupational History   Occupation: supervise principles in school system    Employer: [employer]  Tobacco Use   Smoking status: Never   Smokeless tobacco: Never  Vaping Use   Vaping Use: Never used  Substance and Sexual Activity   Alcohol use: No    Alcohol/week: 0.0 standard drinks   Drug use: No   Sexual activity: Yes    Birth control/protection: Surgical  Other Topics Concern   Not on file  Social History Narrative   Married, 1 daughter and 1 son, pentecostal, exercise most days, walking, biking, running.    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 [employer]   Social Determinants of Corporate investment banker Strain: Not on file  Food Insecurity: Not on file  Transportation Needs: Not on file  Physical Activity: Not on file  Stress: Not on file  Social Connections: Not on file     Family History: The patient's family history includes Arthritis in her brother; Cancer in her father and mother; Dementia in her mother; Fibromyalgia in her brother and sister; Healthy in her daughter and son; Heart disease in her mother; Heart disease (age of onset: 78) in her father; Hyperlipidemia in her brother, brother, father, and sister; Hypertension in her brother, brother, father,  mother, and sister; Lupus in her niece; Stroke in her mother.  ROS:   All other ROS reviewed and negative. Pertinent positives noted in the HPI.     EKGs/Labs/Other Studies Reviewed:   The following studies were personally reviewed by me today:  EKG:  EKG is ordered today.  The ekg ordered today demonstrates normal sinus rhythm heart rate 68, no acute ischemic changes or evidence of infarction, and was personally reviewed by me.   Recent Labs: 05/03/2021: ALT 24; BUN 14; Creat 0.58; Hemoglobin 12.0; Platelets 111; Potassium 4.5; Sodium  139   Recent Lipid Panel    Component Value Date/Time   CHOL 269 (H) 05/24/2020 0941   TRIG 67 05/24/2020 0941   HDL 78 05/24/2020 0941   CHOLHDL 3.4 05/24/2020 0941   VLDL 18 03/29/2017 0821   LDLCALC 175 (H) 05/24/2020 0941    Physical Exam:   VS:  BP (!) 138/94 (BP Location: Left Arm, Patient Position: Sitting, Cuff Size: Normal)   Pulse 68   Ht 5\' 6"  (1.676 m)   Wt 166 lb (75.3 kg)   BMI 26.79 kg/m    Wt Readings from Last 3 Encounters:  09/05/21 166 lb (75.3 kg)  05/03/21 163 lb 9.6 oz (74.2 kg)  12/27/20 156 lb (70.8 kg)    General: Well nourished, well developed, in no acute distress Head: Atraumatic, normal size  Eyes: PEERLA, EOMI  Neck: Supple, no JVD Endocrine: No thryomegaly Cardiac: Normal S1, S2; RRR; no murmurs, rubs, or gallops Lungs: Clear to auscultation bilaterally, no wheezing, rhonchi or rales  Abd: Soft, nontender, no hepatomegaly  Ext: No edema, pulses 2+ Musculoskeletal: No deformities, BUE and BLE strength normal and equal Skin: Warm and dry, no rashes   Neuro: Alert and oriented to person, place, time, and situation, CNII-XII grossly intact, no focal deficits  Psych: Normal mood and affect   ASSESSMENT:   Ronneisha Jett is a 60 y.o. female who presents for the following: 1. Palpitations   2. SOB (shortness of breath) on exertion   3. Precordial pain   4. Mixed hyperlipidemia   5. Primary  hypertension     PLAN:   1. Palpitations -She is having episodes of palpitations.  Can occur with stress.  Lasted 1 minute.  Describes rapid heartbeat sensation.  EKG is normal.  Cardiovascular examination is normal.  We will exclude arrhythmia with a 7-day Zio patch.  We will also check a TSH.  Need to make sure there is no change in thyroid function.  2. SOB (shortness of breath) on exertion 3. Precordial pain -Constellation of symptoms including shortness of breath and exertional chest tightness.  Chest tightness can occur at rest.  This is possible cardiac chest pain.  CVD risk factors include hypertension and fatty liver disease as well as hyperlipidemia.  She also has a very strong family history.  She has no evidence of congestive heart failure on exam.  We will proceed with coronary CTA.  This will exclude infective CAD.  This will also get a good look at the heart architecture.  She will take 100 mg of metoprolol tartrate to heart before the scan.  This will further guide preventive therapies as well.  4. Mixed hyperlipidemia -Recheck lipid profile today.  Most recent LDL 175.  If she ends up having nonobstructive disease will need this lower.  5. Primary hypertension -Blood pressure acceptable today.  Has had wide fluctuations.  I want her to start checking her blood pressure twice per day.  We will continue with amlodipine and HCTZ.  May need to fine-tune medications based on blood pressure fluctuation.  Disposition: Return in about 4 months (around 01/06/2022).  Medication Adjustments/Labs and Tests Ordered: Current medicines are reviewed at length with the patient today.  Concerns regarding medicines are outlined above.  Orders Placed This Encounter  Procedures   CT CORONARY MORPH W/CTA COR W/SCORE W/CA W/CM &/OR WO/CM   Basic metabolic panel   Lipid panel   TSH   LONG TERM MONITOR (3-14 DAYS)   EKG 12-Lead  Meds ordered this encounter  Medications   metoprolol tartrate  (LOPRESSOR) 100 MG tablet    Sig: Take 1 tablet by mouth once for procedure.    Dispense:  1 tablet    Refill:  0     Patient Instructions  Medication Instructions:  Take Metoprolol 100 mg two hours before CT scan when scheduled.   *If you need a refill on your cardiac medications before your next appointment, please call your pharmacy*   Lab Work: BMET, LIPID, TSH today   If you have labs (blood work) drawn today and your tests are completely normal, you will receive your results only by: MyChart Message (if you have MyChart) OR A paper copy in the mail If you have any lab test that is abnormal or we need to change your treatment, we will call you to review the results.   Testing/Procedures: Your physician has requested that you have cardiac CT. Cardiac computed tomography (CT) is a painless test that uses an x-ray machine to take clear, detailed pictures of your heart. For further information please visit [email or web address] follow instruction sheet as given.  ZIO XT- Long Term Monitor Instructions  Your physician has requested you wear a ZIO patch monitor for 7 days.  This is a single patch monitor. [employer] supplies one patch monitor per enrollment. Additional stickers are not available. Please do not apply patch if you will be having a Nuclear Stress Test,  Echocardiogram, Cardiac CT, MRI, or Chest Xray during the period you would be wearing the  monitor. The patch cannot be worn during these tests. You cannot remove and re-apply the  ZIO XT patch monitor.  Your ZIO patch monitor will be mailed 3 day USPS to your address on file. It may take 3-5 days  to receive your monitor after you have been enrolled.  Once you have received your monitor, please review the enclosed instructions. Your monitor  has already been registered assigning a specific monitor serial # to you.  Billing and Patient Assistance Program Information  We have supplied [employer] with any of your  insurance information on file for billing purposes. [employer] offers a sliding scale Patient Assistance Program for patients that do not have  insurance, or whose insurance does not completely cover the cost of the ZIO monitor.  You must apply for the Patient Assistance Program to qualify for this discounted rate.  To apply, please call [employer] at [phone], select option 4, select option 2, ask to apply for  Patient Assistance Program. Sherry Daniels will ask your household income, and how many people  are in your household. They will quote your out-of-pocket cost based on that information.  [employer] will also be able to set up a 23-month, interest-free payment plan if needed.  Applying the monitor   Shave hair from upper left chest.  Hold abrader disc by orange tab. Rub abrader in 40 strokes over the upper left chest as  indicated in your monitor instructions.  Clean area with 4 enclosed alcohol pads. Let dry.  Apply patch as indicated in monitor instructions. Patch will be placed under collarbone on left  side of chest with arrow pointing upward.  Rub patch adhesive wings for 2 minutes. Remove white label marked "1". Remove the white  label marked "2". Rub patch adhesive wings for 2 additional minutes.  While looking in a mirror, press and release button in center of patch. A small green light will  flash 3-4 times. This will be your only indicator  that the monitor has been turned on.  Do not shower for the first 24 hours. You may shower after the first 24 hours.  Press the button if you feel a symptom. You will hear a small click. Record Date, Time and  Symptom in the Patient Logbook.  When you are ready to remove the patch, follow instructions on the last 2 pages of Patient  Logbook. Stick patch monitor onto the last page of Patient Logbook.  Place Patient Logbook in the blue and white box. Use locking tab on box and tape box closed  securely. The blue and white box has prepaid postage on  it. Please place it in the mailbox as  soon as possible. Your physician should have your test results approximately 7 days after the  monitor has been mailed back to [facility].  Call [facility] Customer Care at [phone] if you have questions regarding  your ZIO XT patch monitor. Call them immediately if you see an orange light blinking on your  monitor.  If your monitor falls off in less than 4 days, contact our Monitor department at [phone].  If your monitor becomes loose or falls off after 4 days call [employer] at [phone] for  suggestions on securing your monitor    Follow-Up: At [facility], you and your health needs are our priority.  As part of our continuing mission to provide you with exceptional heart care, we have created designated Provider Care Teams.  These Care Teams include your primary Cardiologist (physician) and Advanced Practice Providers (APPs -  Physician Assistants and Nurse Practitioners) who all work together to provide you with the care you need, when you need it.  We recommend signing up for the patient portal called "MyChart".  Sign up information is provided on this After Visit Summary.  MyChart is used to connect with patients for Virtual Visits (Telemedicine).  Patients are able to view lab/test results, encounter notes, upcoming appointments, etc.  Non-urgent messages can be sent to your provider as well.   To learn more about what you can do with MyChart, go to [email or web address].    Your next appointment:   4 month(s)  The format for your next appointment:   In Person  Provider:   Lennie Odor, MD   Other Instructions Use BP log- take twice daily.     Your cardiac CT will be scheduled at one of the below locations:   [facility] [address] [place], [place] [address] [phone]  If scheduled at [facility], please arrive at the [facility] main entrance (entrance A) of [facility] 30 minutes prior to test start time. You can use the FREE valet parking offered at the main entrance (encouraged to control the heart rate for the test) Proceed to the [facility] Radiology Department (first floor) to check-in and test prep.   Please follow these instructions carefully (unless otherwise directed):   On the Night Before the Test: Be sure to Drink plenty of water. Do not consume any caffeinated/decaffeinated beverages or chocolate 12 hours prior to your test. Do not take any antihistamines 12 hours prior to your test.  On the Day of the Test: Drink plenty of water until 1 hour prior to the test. Do not eat any food 4 hours prior to the test. You may take your regular medications prior to the test.  Take metoprolol (Lopressor) two hours prior to test. HOLD Furosemide/Hydrochlorothiazide morning of the test. FEMALES- please wear  underwire-free bra if available, avoid dresses & tight clothing       After the Test: Drink plenty of water. After receiving IV contrast, you may experience a mild flushed feeling. This is normal. On occasion, you may experience a mild rash up to 24 hours after the test. This is not dangerous. If this occurs, you can take Benadryl 25 mg and increase your fluid intake. If you experience trouble breathing, this can be serious. If it is severe call 911 IMMEDIATELY. If it is mild, please call our office. If you take any of these medications: Glipizide/Metformin, Avandament, Glucavance, please do not take 48 hours after completing test unless otherwise instructed.  Please allow 2-4 weeks for scheduling of routine cardiac CTs. Some insurance companies require a pre-authorization which may delay scheduling of this test.   For non-scheduling related questions, please contact the cardiac imaging nurse navigator should you have any questions/concerns: Sherry Daniels, Cardiac Imaging Nurse Navigator Sherry Daniels, Cardiac Imaging Nurse  Navigator [facility] Direct Office Dial: [phone]   For scheduling needs, including cancellations and rescheduling, please call [place], [phone].    Signed, Lenna Gilford. Flora Lipps, MD, [facility]  [facility]  [address] [place], [place] [address] [phone]  09/05/2021 9:57 AM

## 2021-09-05 ENCOUNTER — Ambulatory Visit: Payer: BC Managed Care – PPO | Admitting: Cardiovascular Disease

## 2021-09-05 ENCOUNTER — Encounter: Payer: Self-pay | Admitting: Cardiovascular Disease

## 2021-09-05 ENCOUNTER — Ambulatory Visit (INDEPENDENT_AMBULATORY_CARE_PROVIDER_SITE_OTHER): Payer: BC Managed Care – PPO

## 2021-09-05 ENCOUNTER — Other Ambulatory Visit: Payer: Self-pay

## 2021-09-05 VITALS — BP 138/94 | HR 68 | Ht 66.0 in | Wt 166.0 lb

## 2021-09-05 DIAGNOSIS — R072 Precordial pain: Secondary | ICD-10-CM

## 2021-09-05 DIAGNOSIS — I10 Essential (primary) hypertension: Secondary | ICD-10-CM

## 2021-09-05 DIAGNOSIS — R0602 Shortness of breath: Secondary | ICD-10-CM

## 2021-09-05 DIAGNOSIS — R002 Palpitations: Secondary | ICD-10-CM

## 2021-09-05 DIAGNOSIS — E782 Mixed hyperlipidemia: Secondary | ICD-10-CM

## 2021-09-05 LAB — BASIC METABOLIC PANEL
BUN/Creatinine Ratio: 19 (ref 9–23)
BUN: 12 mg/dL (ref 6–24)
CO2: 23 mmol/L (ref 20–29)
Calcium: 9.4 mg/dL (ref 8.7–10.2)
Chloride: 104 mmol/L (ref 96–106)
Creatinine, Ser: 0.64 mg/dL (ref 0.57–1.00)
Glucose: 92 mg/dL (ref 70–99)
Potassium: 3.9 mmol/L (ref 3.5–5.2)
Sodium: 140 mmol/L (ref 134–144)
eGFR: 102 mL/min/{1.73_m2} (ref >59)

## 2021-09-05 LAB — LIPID PANEL
Chol/HDL Ratio: 2.7 ratio (ref 0.0–4.4)
Cholesterol, Total: 246 mg/dL — ABNORMAL HIGH (ref 100–199)
HDL: 92 mg/dL (ref >39)
LDL Chol Calc (NIH): 141 mg/dL — ABNORMAL HIGH (ref 0–99)
Triglycerides: 75 mg/dL (ref 0–149)
VLDL Cholesterol Cal: 13 mg/dL (ref 5–40)

## 2021-09-05 LAB — TSH: TSH: 0.935 u[IU]/mL (ref 0.450–4.500)

## 2021-09-05 MED ORDER — METOPROLOL TARTRATE 100 MG PO TABS: ORAL_TABLET | ORAL | 0 refills | Status: DC

## 2021-09-05 NOTE — Patient Instructions (Addendum)
Medication Instructions:  Take Metoprolol 100 mg two hours before CT scan when scheduled.   *If you need a refill on your cardiac medications before your next appointment, please call your pharmacy*   Lab Work: BMET, LIPID, TSH today   If you have labs (blood work) drawn today and your tests are completely normal, you will receive your results only by: MyChart Message (if you have MyChart) OR A paper copy in the mail If you have any lab test that is abnormal or we need to change your treatment, we will call you to review the results.   Testing/Procedures: Your physician has requested that you have cardiac CT. Cardiac computed tomography (CT) is a painless test that uses an x-ray machine to take clear, detailed pictures of your heart. For further information please visit [email or web address] Please follow instruction sheet as given.  ZIO XT- Long Term Monitor Instructions  Your physician has requested you wear a ZIO patch monitor for 7 days.  This is a single patch monitor. [employer] supplies one patch monitor per enrollment. Additional stickers are not available. Please do not apply patch if you will be having a Nuclear Stress Test,  Echocardiogram, Cardiac CT, MRI, or Chest Xray during the period you would be wearing the  monitor. The patch cannot be worn during these tests. You cannot remove and re-apply the  ZIO XT patch monitor.  Your ZIO patch monitor will be mailed 3 day USPS to your address on file. It may take 3-5 days  to receive your monitor after you have been enrolled.  Once you have received your monitor, please review the enclosed instructions. Your monitor  has already been registered assigning a specific monitor serial # to you.  Billing and Patient Assistance Program Information  We have supplied [employer] with any of your insurance information on file for billing purposes. [employer] offers a sliding scale Patient Assistance Program for patients that do not have   insurance, or whose insurance does not completely cover the cost of the ZIO monitor.  You must apply for the Patient Assistance Program to qualify for this discounted rate.  To apply, please call [employer] at [phone], select option 4, select option 2, ask to apply for  Patient Assistance Program. Meredeth Ide will ask your household income, and how many people  are in your household. They will quote your out-of-pocket cost based on that information.  [employer] will also be able to set up a 55-month, interest-free payment plan if needed.  Applying the monitor   Shave hair from upper left chest.  Hold abrader disc by orange tab. Rub abrader in 40 strokes over the upper left chest as  indicated in your monitor instructions.  Clean area with 4 enclosed alcohol pads. Let dry.  Apply patch as indicated in monitor instructions. Patch will be placed under collarbone on left  side of chest with arrow pointing upward.  Rub patch adhesive wings for 2 minutes. Remove white label marked "1". Remove the white  label marked "2". Rub patch adhesive wings for 2 additional minutes.  While looking in a mirror, press and release button in center of patch. A small green light will  flash 3-4 times. This will be your only indicator that the monitor has been turned on.  Do not shower for the first 24 hours. You may shower after the first 24 hours.  Press the button if you feel a symptom. You will hear a small click. Record Date, Time and  Symptom in the  Patient Logbook.  When you are ready to remove the patch, follow instructions on the last 2 pages of Patient  Logbook. Stick patch monitor onto the last page of Patient Logbook.  Place Patient Logbook in the blue and white box. Use locking tab on box and tape box closed  securely. The blue and white box has prepaid postage on it. Please place it in the mailbox as  soon as possible. Your physician should have your test results approximately 7 days after the  monitor  has been mailed back to [facility].  Call [facility] Customer Care at [phone] if you have questions regarding  your ZIO XT patch monitor. Call them immediately if you see an orange light blinking on your  monitor.  If your monitor falls off in less than 4 days, contact our Monitor department at [phone].  If your monitor becomes loose or falls off after 4 days call [employer] at [phone] for  suggestions on securing your monitor    Follow-Up: At [facility], you and your health needs are our priority.  As part of our continuing mission to provide you with exceptional heart care, we have created designated Provider Care Teams.  These Care Teams include your primary Cardiologist (physician) and Advanced Practice Providers (APPs -  Physician Assistants and Nurse Practitioners) who all work together to provide you with the care you need, when you need it.  We recommend signing up for the patient portal called "MyChart".  Sign up information is provided on this After Visit Summary.  MyChart is used to connect with patients for Virtual Visits (Telemedicine).  Patients are able to view lab/test results, encounter notes, upcoming appointments, etc.  Non-urgent messages can be sent to your provider as well.   To learn more about what you can do with MyChart, go to [email or web address].    Your next appointment:   4 month(s)  The format for your next appointment:   In Person  Provider:   Lennie Odor, MD   Other Instructions Use BP log- take twice daily.     Your cardiac CT will be scheduled at one of the below locations:   [facility] [address] [place], [place] [address] [phone]  If scheduled at [facility], please arrive at the [facility] main entrance (entrance A) of [facility] 30 minutes prior to test start time. You can use the FREE valet parking offered at the main entrance (encouraged to control the heart rate  for the test) Proceed to the [facility] Radiology Department (first floor) to check-in and test prep.   Please follow these instructions carefully (unless otherwise directed):   On the Night Before the Test: Be sure to Drink plenty of water. Do not consume any caffeinated/decaffeinated beverages or chocolate 12 hours prior to your test. Do not take any antihistamines 12 hours prior to your test.  On the Day of the Test: Drink plenty of water until 1 hour prior to the test. Do not eat any food 4 hours prior to the test. You may take your regular medications prior to the test.  Take metoprolol (Lopressor) two hours prior to test. HOLD Furosemide/Hydrochlorothiazide morning of the test. FEMALES- please wear underwire-free bra if available, avoid dresses & tight clothing       After the Test: Drink plenty of water. After receiving IV contrast, you may experience a mild flushed feeling. This is normal. On occasion, you may experience a mild rash up to 24  hours after the test. This is not dangerous. If this occurs, you can take Benadryl 25 mg and increase your fluid intake. If you experience trouble breathing, this can be serious. If it is severe call 911 IMMEDIATELY. If it is mild, please call our office. If you take any of these medications: Glipizide/Metformin, Avandament, Glucavance, please do not take 48 hours after completing test unless otherwise instructed.  Please allow 2-4 weeks for scheduling of routine cardiac CTs. Some insurance companies require a pre-authorization which may delay scheduling of this test.   For non-scheduling related questions, please contact the cardiac imaging nurse navigator should you have any questions/concerns: Rockwell Alexandria, Cardiac Imaging Nurse Navigator Larey Brick, Cardiac Imaging Nurse Navigator [facility] Direct Office Dial: [phone]   For scheduling needs, including cancellations and rescheduling, please call  [place], [phone].

## 2021-09-05 NOTE — Progress Notes (Unsigned)
Patient enrolled for [employer] to mail a 7 day ZIO XT monitor to her address on file. 

## 2021-09-09 DIAGNOSIS — R002 Palpitations: Secondary | ICD-10-CM

## 2021-09-15 ENCOUNTER — Telehealth (HOSPITAL_COMMUNITY): Payer: Self-pay | Admitting: *Deleted

## 2021-09-15 NOTE — Telephone Encounter (Signed)
Reaching out to patient to offer assistance regarding upcoming cardiac imaging study; pt verbalizes understanding of appt date/time, parking situation and where to check in, pre-test NPO status and medications ordered, and verified current allergies; name and call back number provided for further questions should they arise  Larey Brick RN Navigator Cardiac Imaging [facility] [phone] office [phone] cell  Patient to take 100mg  metoprolol tartrate two hours prior to cardiac CT scan.  She reports claustrophobia but will states she will be fine if a wet washcloth is put over her eyes.

## 2021-09-16 ENCOUNTER — Other Ambulatory Visit: Payer: Self-pay

## 2021-09-16 ENCOUNTER — Ambulatory Visit (HOSPITAL_COMMUNITY)
Admission: RE | Admit: 2021-09-16 | Discharge: 2021-09-16 | Disposition: A | Discharge status: Home / Self Care | Payer: BC Managed Care – PPO | Source: Ambulatory Visit | Attending: Cardiovascular Disease | Admitting: Cardiovascular Disease

## 2021-09-16 DIAGNOSIS — R072 Precordial pain: Secondary | ICD-10-CM | POA: Insufficient documentation

## 2021-09-16 MED ORDER — NITROGLYCERIN 0.4 MG SL SUBL
0.8000 mg | SUBLINGUAL_TABLET | Freq: Once | SUBLINGUAL | Status: AC
  Administered 2021-09-16: 0.8 mg via SUBLINGUAL

## 2021-09-16 MED ORDER — IOHEXOL 350 MG/ML SOLN
100.0000 mL | Freq: Once | INTRAVENOUS | Status: AC | PRN
  Administered 2021-09-16: 95 mL via INTRAVENOUS

## 2021-09-16 NOTE — Progress Notes (Signed)
Ct scan completed. IV infiltrated, PA at bedside assessing. Area slightly swollen. No redness. Pt denies any pain at site. BP low, pt states she feels dizzy. Will bring to nurse station for observation

## 2021-09-16 NOTE — Progress Notes (Signed)
Pt states she feels a little better. Blood pressure remains low. Explained to pt I can start another IV to give here IV fluids or we can wait some time to see if after drinking she feels better. Pt states she wants to wait on the IV.

## 2021-09-16 NOTE — Progress Notes (Signed)
  Evaluation after Contrast Extravasation  Patient seen and examined immediately after contrast extravasation while in CT scanner 1.  Exam: There is moderate swelling at the right aC area.  There is no erythema. There is no discoloration. There are no blisters. There are no signs of decreased perfusion of the skin.  It is is slightly cool to touch.  The patient has full ROM in fingers.    Per contrast extravasation protocol, I have instructed the patient to keep an ice pack on the area for 20-60 minutes at a time for about 48 hours.   Keep arm elevated as much as possible.   The patient understands to call the radiology department if there is: - increase in pain or swelling - changed or altered sensation - ulceration or blistering - increasing redness - warmth or increasing firmness - decreased tissue perfusion as noted by decreased capillary refill or discoloration of skin - decreased pulses peripheral to site   Alene Mires NP 09/16/2021 10:14 AM

## 2021-09-16 NOTE — Progress Notes (Signed)
Pt states she feels much better and is ready to go home. Blood pressure slightly better. Pt able to ambulate without difficulty and stating she wants to go. Walked pt to elevator with no difficulty. D/C'd home in no distress.

## 2021-09-16 NOTE — Progress Notes (Signed)
Pts blood pressure remains low. States she feels like she may pass out. Placed on stretcher. Drinking coffee and coke. Ice pack applied to IV site.

## 2021-09-22 ENCOUNTER — Other Ambulatory Visit: Payer: BC Managed Care – PPO

## 2021-09-30 ENCOUNTER — Other Ambulatory Visit: Payer: Self-pay

## 2021-09-30 ENCOUNTER — Encounter: Payer: Self-pay | Admitting: Nurse Practitioner

## 2021-09-30 ENCOUNTER — Ambulatory Visit: Payer: BC Managed Care – PPO | Admitting: Nurse Practitioner

## 2021-09-30 VITALS — BP 142/82 | HR 72 | Temp 97.4°F | Ht 66.0 in | Wt 163.0 lb

## 2021-09-30 DIAGNOSIS — G43711 Chronic migraine without aura, intractable, with status migrainosus: Secondary | ICD-10-CM | POA: Diagnosis not present

## 2021-09-30 MED ORDER — PROMETHAZINE HCL 12.5 MG PO TABS: 12.5000 mg | ORAL_TABLET | Freq: Three times a day (TID) | ORAL | 0 refills | Status: AC | PRN

## 2021-09-30 MED ORDER — KETOROLAC TROMETHAMINE 60 MG/2ML IM SOLN
60.0000 mg | Freq: Once | INTRAMUSCULAR | Status: AC
  Administered 2021-09-30: 60 mg via INTRAMUSCULAR

## 2021-09-30 MED ORDER — TOPIRAMATE 50 MG PO TABS: 50.0000 mg | ORAL_TABLET | Freq: Two times a day (BID) | ORAL | 2 refills | Status: DC

## 2021-09-30 NOTE — Progress Notes (Signed)
Subjective:    Patient ID: Sherry Daniels, female    DOB: 1960/11/27, 60 y.o.   MRN: [id number]  HPI: Sherry Daniels is a 60 y.o. female presenting for migraine.  Chief Complaint  Patient presents with   Headache    Migraine x 3 days    MIGRAINE Patient is sitting in a dark room upon my entrance.  She is currently working as a Freight forwarder.  She has had a severe headache for a number of days.  She reports she gets these a few times per year, if she does not get some quick enough for they do not respond to medication, she comes our office for a shot.  Her headache is similar to her previous headaches.  She is requesting shot so she can return to work today.   Duration: days Onset: sudden Severity: severe  Frequency: intermittent Location: bilateral temples, eyes, neck Headache duration: 3 days Radiation:  yes Headache status at time of visit: current headache Treatments attempted: rest, ice, heat, APAP, ibuprofen, aleve", excedrine, and topamax   Aura: yes Nausea:  yes Vomiting: no Photophobia:  yes Phonophobia:  no Effect on social functioning:  no Numbers of missed days of school/work each month: 0 Confusion:  no Gait disturbance/ataxia:  no Behavioral changes:  no Fevers:  no   Allergies  Allergen Reactions   Aspirin Nausea Only   Codeine Nausea Only   Cymbalta [Duloxetine Hcl]     Felt like it worsened anxiety and fibromyalgia   Simvastatin Other (See Comments)    dizziness    Outpatient Encounter Medications as of 09/30/2021  Medication Sig   acetaminophen (TYLENOL) 500 MG tablet Take 1,500 mg by mouth every 6 (six) hours as needed. Reported on 05/01/2016   albuterol (VENTOLIN HFA) 108 (90 Base) MCG/ACT inhaler USE 2 PUFFS EVERY 4 TO 6 HOURS AS NEEDED   amLODipine (NORVASC) 5 MG tablet TAKE 1 TABLET BY MOUTH EVERY DAY   aspirin 81 MG EC tablet 1 tablet po qOD (Patient taking differently: Take 81 mg by mouth daily.)   atorvastatin  (LIPITOR) 20 MG tablet Take 1 tablet (20 mg total) by mouth daily.   diclofenac sodium (VOLTAREN) 1 % GEL Apply 2 g topically 4 (four) times daily.   escitalopram (LEXAPRO) 10 MG tablet TAKE 1 TABLET BY MOUTH EVERY DAY   hydrochlorothiazide (MICROZIDE) 12.5 MG capsule TAKE 1 CAPSULE BY MOUTH EVERY DAY   ibuprofen (ADVIL,MOTRIN) 600 MG tablet Take 1 tablet (600 mg total) by mouth every 6 (six) hours as needed.   linaclotide (LINZESS) 72 MCG capsule Take 72 mcg by mouth daily before breakfast.   metoprolol tartrate (LOPRESSOR) 100 MG tablet Take 1 tablet by mouth once for procedure.   Misc Natural Products (TART CHERRY ADVANCED PO) Take by mouth.   Multiple Vitamins-Minerals (MULTIVITAMIN WITH MINERALS) tablet Take 1 tablet by mouth daily.   Omega-3 1000 MG CAPS Take by mouth.   promethazine (PHENERGAN) 12.5 MG tablet Take 1 tablet (12.5 mg total) by mouth every 8 (eight) hours as needed for nausea or vomiting.   topiramate (TOPAMAX) 25 MG tablet TAKE 2 TABLETS (50 MG TOTAL) BY MOUTH 2 (TWO) TIMES DAILY.   TURMERIC PO Take by mouth.   No facility-administered encounter medications on file as of 09/30/2021.    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 Loreta Ave   Fibromyalgia    GERD (gastroesophageal reflux disease)    Hyperlipidemia    Hypertension    Migraine    Nonalcoholic fatty liver disease      Relevant past medical, surgical, family and social history reviewed and updated as indicated. Interim medical history since our last visit reviewed.  Review of Systems Per HPI unless specifically indicated above     Objective:    BP (!) 142/82   Pulse 72   Temp (!) 97.4 F (36.3 C)   Ht 5\' 6"  (1.676 m)   Wt 163 lb (73.9 kg)   SpO2 98%   BMI 26.31 kg/m   Wt Readings from Last 3 Encounters:  09/30/21 163 lb (73.9 kg)  09/05/21 166 lb (75.3 kg)  05/03/21 163 lb 9.6 oz (74.2 kg)    Physical Exam Vitals and nursing note reviewed.  Constitutional:      General: She is not in acute distress.    Appearance: She is well-developed. She is not toxic-appearing.  HENT:     Head: Normocephalic and atraumatic.     Mouth/Throat:     Mouth: Mucous membranes are moist.     Pharynx: Oropharynx is clear.  Eyes:     General: No scleral icterus.    Extraocular Movements: Extraocular movements intact.     Right eye: Normal extraocular motion and no nystagmus.     Left eye: Normal extraocular motion and no nystagmus.     Pupils: Pupils are equal, round, and reactive to light.  Cardiovascular:     Rate and Rhythm: Normal rate and regular rhythm.  Pulmonary:     Effort: Pulmonary effort is normal. No respiratory distress.  Musculoskeletal:     Cervical back: Normal range of motion and neck supple. No rigidity.  Skin:    General: Skin is warm and dry.     Capillary Refill: Capillary refill takes less than 2 seconds.     Coloration: Skin is not pale.     Findings: No erythema or rash.  Neurological:     Mental Status: She is alert and oriented to person, place, and time.     Sensory: No sensory deficit.     Coordination: Coordination normal.     Gait: Gait normal.  Psychiatric:        Mood and Affect: Mood normal. Mood is not anxious.        Speech: Speech normal.        Behavior: Behavior normal. Behavior is not agitated.      Assessment & Plan:  1. Intractable chronic  migraine without aura and with status migrainosus Acute.  No red flag signs or symptoms today in history or on examination.  We will give 60 mg of IM Toradol today in office.  Refill given for Phenergan-uses sparingly if she becomes nauseous or vomits from migraine.  Refill of Topamax 50 mg sent into pharmacy -patient has not been taking twice daily, but plans to start to prevent future migraines.  Follow-up with no improvement.  - promethazine (PHENERGAN) 12.5 MG tablet; Take 1 tablet (12.5 mg total) by mouth every 8 (eight) hours as needed for nausea or vomiting.  Dispense: 20 tablet; Refill: 0 - topiramate (TOPAMAX) 50 MG tablet; Take 1 tablet (50 mg total) by mouth 2 (two) times daily.  Dispense: 180 tablet; Refill: 2 - ketorolac (TORADOL) injection 60 mg    Follow up plan: No follow-ups on file.

## 2021-10-13 ENCOUNTER — Other Ambulatory Visit: Payer: BC Managed Care – PPO

## 2021-10-25 ENCOUNTER — Other Ambulatory Visit: Payer: Self-pay | Admitting: Family Medicine

## 2021-10-25 DIAGNOSIS — I10 Essential (primary) hypertension: Secondary | ICD-10-CM

## 2021-11-15 ENCOUNTER — Telehealth: Payer: Self-pay

## 2021-11-15 NOTE — Telephone Encounter (Signed)
Appt made for tomorrow [id number]

## 2021-11-15 NOTE — Telephone Encounter (Signed)
NA and no VM for pt - needs appt to be seen in office per Dr Tanya Nones.

## 2021-11-15 NOTE — Telephone Encounter (Signed)
Pt called - she is aware no breathing treatments in office currently due to no NEG pressure rooms/ covid.   She does have a NEB machine and supplies at home - but no meds to put in it.  She has a rescue inhaler - albuterol hfa that she is using frequently.  Can neb med be sent to her [facility]?

## 2021-11-15 NOTE — Telephone Encounter (Signed)
Pt called in stating that she is recently getting over covid but is still having some breathing issues. Pt states that she would like to know if she would benefit from getting a breathing treatment. Pt would also like to know if a breathing treatment is something that her pcp could call in to pharmacy. Please advise.  Cb#: [phone]

## 2021-11-16 ENCOUNTER — Ambulatory Visit
Admission: RE | Admit: 2021-11-16 | Discharge: 2021-11-16 | Disposition: A | Discharge status: Home / Self Care | Payer: BC Managed Care – PPO | Source: Ambulatory Visit | Attending: Nurse Practitioner | Admitting: Nurse Practitioner

## 2021-11-16 ENCOUNTER — Encounter: Payer: Self-pay | Admitting: Nurse Practitioner

## 2021-11-16 ENCOUNTER — Other Ambulatory Visit: Payer: Self-pay

## 2021-11-16 ENCOUNTER — Ambulatory Visit (INDEPENDENT_AMBULATORY_CARE_PROVIDER_SITE_OTHER): Payer: BC Managed Care – PPO | Admitting: Nurse Practitioner

## 2021-11-16 VITALS — BP 136/84 | HR 75 | Ht 66.0 in | Wt 163.0 lb

## 2021-11-16 DIAGNOSIS — R0602 Shortness of breath: Secondary | ICD-10-CM

## 2021-11-16 DIAGNOSIS — Z8709 Personal history of other diseases of the respiratory system: Secondary | ICD-10-CM

## 2021-11-16 MED ORDER — PREDNISONE 20 MG PO TABS: 40.0000 mg | ORAL_TABLET | Freq: Every day | ORAL | 0 refills | Status: DC

## 2021-11-16 MED ORDER — ALBUTEROL SULFATE (2.5 MG/3ML) 0.083% IN NEBU: 2.5000 mg | INHALATION_SOLUTION | Freq: Four times a day (QID) | RESPIRATORY_TRACT | 1 refills | Status: AC | PRN

## 2021-11-16 MED ORDER — ALBUTEROL SULFATE (2.5 MG/3ML) 0.083% IN NEBU
2.5000 mg | INHALATION_SOLUTION | Freq: Once | RESPIRATORY_TRACT | Status: AC
  Administered 2021-11-16: 2.5 mg via RESPIRATORY_TRACT

## 2021-11-16 NOTE — Progress Notes (Signed)
Subjective:    Patient ID: Darnelle Maffucci, female    DOB: 03-18-1961, 61 y.o.   MRN: [id number]  HPI: Vonnie Spagnolo is a 61 y.o. female presenting for shortness of breath.  Chief Complaint  Patient presents with   Shortness of Breath    Positive covid 10/28/2021, having to use inhaler more frequent   SHORTNESS OF BREATH Diagnosed with COVID-19 about 3-4 weeks ago.   Reports since that time, she is using rescue inhaler more often than she is used to.  Reports she had asthma "on and off" as a child.  She sings and is unable to sing right now because she feels she cannot take a deep breath.  Also reports shortness of breath when she is talking normally. Duration: weeks Onset: sudden Description of breathing discomfort: feels like she has to take a lot of breaths Severity: moderate Episode duration: less than 1 minutes Frequency: multiple times daily Related to exertion: at times Cough: yes; congestion is gone Chest tightness: yes Wheezing: no Fevers: low grade yesterday 99.9 Chest pain: no Palpitations: no  Nausea: no Diaphoresis: no Deconditioning: no Status: stable Aggravating factors: singing, talking a lot, activity Alleviating factors: hot cups of coffee, tea, rest, albuterol Treatments attempted: albuterol  Allergies  Allergen Reactions   Aspirin Nausea Only   Codeine Nausea Only   Cymbalta [Duloxetine Hcl]     Felt like it worsened anxiety and fibromyalgia   Simvastatin Other (See Comments)    dizziness    Outpatient Encounter Medications as of 11/16/2021  Medication Sig   albuterol (PROVENTIL) (2.5 MG/3ML) 0.083% nebulizer solution Take 3 mLs (2.5 mg total) by nebulization every 6 (six) hours as needed for wheezing or shortness of breath.   albuterol (VENTOLIN HFA) 108 (90 Base) MCG/ACT inhaler USE 2 PUFFS EVERY 4 TO 6 HOURS AS NEEDED   amLODipine (NORVASC) 5 MG tablet TAKE 1 TABLET BY MOUTH EVERY DAY   aspirin 81 MG EC tablet 1  tablet po qOD (Patient taking differently: Take 81 mg by mouth daily.)   escitalopram (LEXAPRO) 10 MG tablet TAKE 1 TABLET BY MOUTH EVERY DAY   estradiol (ESTRACE) 1 MG tablet Take 1 mg by mouth daily.   hydrochlorothiazide (MICROZIDE) 12.5 MG capsule TAKE 1 CAPSULE BY MOUTH EVERY DAY   ibuprofen (ADVIL,MOTRIN) 600 MG tablet Take 1 tablet (600 mg total) by mouth every 6 (six) hours as needed.   Misc Natural Products (TART CHERRY ADVANCED PO) Take by mouth.   Multiple Vitamins-Minerals (MULTIVITAMIN WITH MINERALS) tablet Take 1 tablet by mouth daily.   Omega-3 1000 MG CAPS Take by mouth.   predniSONE (DELTASONE) 20 MG tablet Take 2 tablets (40 mg total) by mouth daily with breakfast.   promethazine (PHENERGAN) 12.5 MG tablet Take 1 tablet (12.5 mg total) by mouth every 8 (eight) hours as needed for nausea or vomiting.   TURMERIC PO Take by mouth.   acetaminophen (TYLENOL) 500 MG tablet Take 1,500 mg by mouth every 6 (six) hours as needed. Reported on 05/01/2016   linaclotide (LINZESS) 72 MCG capsule Take 72 mcg by mouth daily before breakfast. (Patient not taking: Reported on 11/16/2021)   topiramate (TOPAMAX) 50 MG tablet Take 1 tablet (50 mg total) by mouth 2 (two) times daily.   [DISCONTINUED] atorvastatin (LIPITOR) 20 MG tablet Take 1 tablet (20 mg total) by mouth daily.   [DISCONTINUED] diclofenac sodium (VOLTAREN) 1 % GEL Apply 2 g topically 4 (four) times daily.   [DISCONTINUED] metoprolol tartrate (  LOPRESSOR) 100 MG tablet Take 1 tablet by mouth once for procedure.   [EXPIRED] albuterol (PROVENTIL) (2.5 MG/3ML) 0.083% nebulizer solution 2.5 mg    No facility-administered encounter medications on file as of 11/16/2021.    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 Loreta Ave   Fibromyalgia    GERD (gastroesophageal reflux disease)    Hyperlipidemia    Hypertension    Migraine    Nonalcoholic fatty liver disease     Relevant past medical, surgical, family and social history reviewed and updated as indicated. Interim medical history since our last visit reviewed.  Review of Systems Per HPI unless specifically indicated above     Objective:    BP 136/84    Pulse 75    Ht 5\' 6"  (1.676 m)    Wt 163 lb (73.9 kg)    SpO2 96%    BMI 26.31 kg/m   Wt Readings from Last 3 Encounters:  11/16/21 163 lb (73.9 kg)  09/30/21 163 lb (73.9 kg)  09/05/21 166 lb (75.3 kg)    Physical Exam Vitals and nursing note reviewed.  Constitutional:      General: She is not in acute distress.    Appearance: She is well-developed. She is not toxic-appearing.  HENT:     Head: Normocephalic and atraumatic.  Cardiovascular:     Rate and Rhythm: Normal rate and regular rhythm.  Pulmonary:     Effort: Pulmonary effort is normal.     Breath sounds: Examination of the right-middle field reveals decreased breath sounds. Examination of the left-middle field reveals decreased breath sounds. Examination of the right-lower field reveals decreased breath sounds. Examination of the left-lower field reveals decreased breath sounds. Decreased breath sounds present. No wheezing, rhonchi or rales.  Skin:    General: Skin is warm and dry.     Coloration: Skin is not cyanotic or pale.     Findings: No erythema.  Neurological:     Mental Status: She is alert and oriented to  person, place, and time.  Psychiatric:        Mood and Affect: Mood is anxious.        Behavior: Behavior normal.      Assessment & Plan:  1. Shortness of breath Acute.  S/p COVID about 3-4 weeks ago.  Will check chest x-ray to ensure no pneumonia, however some concern given continued low grade fevers and cough with congestion.  Start prednisone burst given history of asthma and will send in prescription for SABA nebulizer.  We discussed not using both inhaler and nebulizer and she is to only use every 4-6 hours as needed.  Return to clinic if symptoms persist despite treatment.   - DG Chest 2 View; Future - albuterol (PROVENTIL) (2.5 MG/3ML) 0.083% nebulizer solution 2.5 mg - predniSONE (DELTASONE) 20 MG tablet; Take 2 tablets (40 mg total) by mouth  daily with breakfast.  Dispense: 10 tablet; Refill: 0 - albuterol (PROVENTIL) (2.5 MG/3ML) 0.083% nebulizer solution; Take 3 mLs (2.5 mg total) by nebulization every 6 (six) hours as needed for wheezing or shortness of breath.  Dispense: 150 mL; Refill: 1  2. History of asthma     Follow up plan: Return if symptoms worsen or fail to improve.

## 2021-12-30 ENCOUNTER — Encounter: Payer: BC Managed Care – PPO | Admitting: Family Medicine

## 2022-01-01 NOTE — Progress Notes (Unsigned)
Cardiology Office Note:   Date:  01/01/2022  NAME:  Sherry Daniels    MRN: [id number] DOB:  05-19-1961   PCP:  Susy Frizzle, MD  Cardiologist:  None  Electrophysiologist:  None   Referring MD: Susy Frizzle, MD   No chief complaint on file. ***  History of Present Illness:   Sherry Daniels is a 61 y.o. female with a hx of hypertension, hyperlipidemia who presents for follow-up.  Evaluated in October 2022 for palpitations.  Also reported chest tightness and shortness of breath.  Coronary CTA was normal.  Monitor was normal.  She presents for follow-up.  Problem List Sicca complex HTN HLD -T chol 269, HDL 78, LDL 175, TG 67 Fibromyalgia IBS  Past Medical History: Past Medical History:  Diagnosis Date   Allergy    Asthma    Chest pain 11/2009   overnight hospitalization   Constipation    Dr. Collene Mares   Fibromyalgia    GERD (gastroesophageal reflux disease)    Hyperlipidemia    Hypertension    Migraine    Nonalcoholic fatty liver disease     Past Surgical History: Past Surgical History:  Procedure Laterality Date   ABDOMINAL HYSTERECTOMY     total   COLONOSCOPY  09/15/2009   Dr. Collene Mares; normal   WISDOM TOOTH EXTRACTION      Current Medications: No outpatient medications have been marked as taking for the 01/04/22 encounter (Appointment) with Geralynn Rile, MD.     Allergies:    Aspirin, Codeine, Cymbalta [duloxetine hcl], and Simvastatin   Social History: Social History   Socioeconomic History   Marital status: Married    Spouse name: Not on file   Number of children: 2   Years of education: Not on file   Highest education level: Not on file  Occupational History   Occupation: supervise principles in school system    Employer: [employer]  Tobacco Use   Smoking status: Never   Smokeless tobacco: Never  Vaping Use   Vaping Use: Never used  Substance and Sexual Activity   Alcohol use: No    Alcohol/week: 0.0  standard drinks   Drug use: No   Sexual activity: Yes    Birth control/protection: Surgical  Other Topics Concern   Not on file  Social History Narrative   Married, 1 daughter and 1 son, pentecostal, exercise most days, walking, biking, running.    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 [employer]   Social Determinants of Radio broadcast assistant Strain: Not on file  Food Insecurity: Not on file  Transportation Needs: Not on file  Physical Activity: Not on file  Stress: Not on file  Social Connections: Not on file     Family History: The patient's ***family history includes Arthritis in her brother; Cancer in her father and mother; Dementia in her mother; Fibromyalgia in her brother and sister; Healthy in her daughter and son; Heart disease in her mother; Heart disease (age of onset: 28) in her father; Hyperlipidemia in her brother, brother, father, and sister; Hypertension in her brother, brother, father, mother, and sister; Lupus in her niece; Stroke in her mother.  ROS:   All other ROS reviewed and negative. Pertinent positives noted in the HPI.     EKGs/Labs/Other Studies Reviewed:   The following studies were personally reviewed by me today:  EKG:  EKG is *** ordered today.  The ekg ordered today demonstrates ***, and was personally reviewed by me.  CCTA 09/16/2021 Normal  CAC score 0   Zio 10/12/2021 Impression: 1. No arrhythmias detected.  2. Rare ectopy.   Recent Labs: 05/03/2021: ALT 24; Hemoglobin 12.0; Platelets 111 09/05/2021: BUN 12; Creatinine, Ser 0.64; Potassium 3.9; Sodium 140; TSH 0.935   Recent Lipid Panel    Component Value Date/Time   CHOL 246 (H) 09/05/2021 0840   TRIG 75 09/05/2021 0840   HDL 92 09/05/2021 0840   CHOLHDL 2.7 09/05/2021 0840   CHOLHDL 3.4 05/24/2020 0941   VLDL 18 03/29/2017 0821   LDLCALC 141 (H) 09/05/2021 0840   LDLCALC 175 (H) 05/24/2020 0941    Physical Exam:   VS:  There were no vitals taken for this visit.   Wt  Readings from Last 3 Encounters:  11/16/21 163 lb (73.9 kg)  09/30/21 163 lb (73.9 kg)  09/05/21 166 lb (75.3 kg)    General: Well nourished, well developed, in no acute distress Head: Atraumatic, normal size  Eyes: PEERLA, EOMI  Neck: Supple, no JVD Endocrine: No thryomegaly Cardiac: Normal S1, S2; RRR; no murmurs, rubs, or gallops Lungs: Clear to auscultation bilaterally, no wheezing, rhonchi or rales  Abd: Soft, nontender, no hepatomegaly  Ext: No edema, pulses 2+ Musculoskeletal: No deformities, BUE and BLE strength normal and equal Skin: Warm and dry, no rashes   Neuro: Alert and oriented to person, place, time, and situation, CNII-XII grossly intact, no focal deficits  Psych: Normal mood and affect   ASSESSMENT:   Sherry Daniels is a 61 y.o. female who presents for the following: No diagnosis found.  PLAN:   There are no diagnoses linked to this encounter.  {Are you ordering a CV Procedure (e.g. stress test, cath, DCCV, TEE, etc)?   Press F2        :[id number]  Disposition: No follow-ups on file.  Medication Adjustments/Labs and Tests Ordered: Current medicines are reviewed at length with the patient today.  Concerns regarding medicines are outlined above.  No orders of the defined types were placed in this encounter.  No orders of the defined types were placed in this encounter.   There are no Patient Instructions on file for this visit.   Time Spent with Patient: I have spent a total of *** minutes with patient reviewing hospital notes, telemetry, EKGs, labs and examining the patient as well as establishing an assessment and plan that was discussed with the patient.  > 50% of time was spent in direct patient care.  Signed, Addison Naegeli. Audie Box, MD, [facility]  [address], [place], [place] [address] [phone]  01/01/2022 3:47 PM

## 2022-01-04 ENCOUNTER — Ambulatory Visit: Payer: BC Managed Care – PPO | Admitting: Cardiovascular Disease

## 2022-01-04 DIAGNOSIS — R0602 Shortness of breath: Secondary | ICD-10-CM

## 2022-01-04 DIAGNOSIS — E782 Mixed hyperlipidemia: Secondary | ICD-10-CM

## 2022-01-04 DIAGNOSIS — I10 Essential (primary) hypertension: Secondary | ICD-10-CM

## 2022-06-15 ENCOUNTER — Ambulatory Visit (HOSPITAL_COMMUNITY)
Admission: EM | Admit: 2022-06-15 | Discharge: 2022-06-15 | Disposition: A | Discharge status: Home / Self Care | Payer: BC Managed Care – PPO | Attending: Internal Medicine | Admitting: Internal Medicine

## 2022-06-15 ENCOUNTER — Encounter (HOSPITAL_COMMUNITY): Payer: Self-pay | Admitting: Emergency Medicine

## 2022-06-15 DIAGNOSIS — R140 Abdominal distension (gaseous): Secondary | ICD-10-CM | POA: Diagnosis present

## 2022-06-15 DIAGNOSIS — R609 Edema, unspecified: Secondary | ICD-10-CM | POA: Diagnosis present

## 2022-06-15 LAB — CBC WITH DIFFERENTIAL/PLATELET
Abs Immature Granulocytes: 0.01 10*3/uL (ref 0.00–0.07)
Basophils Absolute: 0 10*3/uL (ref 0.0–0.1)
Basophils Relative: 1 %
Eosinophils Absolute: 0.2 10*3/uL (ref 0.0–0.5)
Eosinophils Relative: 4 %
HCT: 36.4 % (ref 36.0–46.0)
Hemoglobin: 12.3 g/dL (ref 12.0–15.0)
Immature Granulocytes: 0 %
Lymphocytes Relative: 42 %
Lymphs Abs: 2.2 10*3/uL (ref 0.7–4.0)
MCH: 29.9 pg (ref 26.0–34.0)
MCHC: 33.8 g/dL (ref 30.0–36.0)
MCV: 88.3 fL (ref 80.0–100.0)
Monocytes Absolute: 0.4 10*3/uL (ref 0.1–1.0)
Monocytes Relative: 7 %
Neutro Abs: 2.4 10*3/uL (ref 1.7–7.7)
Neutrophils Relative %: 46 %
Platelets: 266 10*3/uL (ref 150–400)
RBC: 4.12 MIL/uL (ref 3.87–5.11)
RDW: 13.2 % (ref 11.5–15.5)
WBC: 5.2 10*3/uL (ref 4.0–10.5)
nRBC: 0 % (ref 0.0–0.2)

## 2022-06-15 LAB — LIPASE, BLOOD: Lipase: 28 U/L (ref 11–51)

## 2022-06-15 LAB — COMPREHENSIVE METABOLIC PANEL
ALT: 29 U/L (ref 0–44)
AST: 26 U/L (ref 15–41)
Albumin: 3.6 g/dL (ref 3.5–5.0)
Alkaline Phosphatase: 78 U/L (ref 38–126)
Anion gap: 8 (ref 5–15)
BUN: 12 mg/dL (ref 6–20)
CO2: 29 mmol/L (ref 22–32)
Calcium: 9.6 mg/dL (ref 8.9–10.3)
Chloride: 101 mmol/L (ref 98–111)
Creatinine, Ser: 0.64 mg/dL (ref 0.44–1.00)
GFR, Estimated: >60 mL/min (ref >60)
Glucose, Bld: 101 mg/dL — ABNORMAL HIGH (ref 70–99)
Potassium: 3.6 mmol/L (ref 3.5–5.1)
Sodium: 138 mmol/L (ref 135–145)
Total Bilirubin: 0.4 mg/dL (ref 0.3–1.2)
Total Protein: 7.5 g/dL (ref 6.5–8.1)

## 2022-06-15 LAB — BRAIN NATRIURETIC PEPTIDE: B Natriuretic Peptide: 7.1 pg/mL (ref 0.0–100.0)

## 2022-06-15 MED ORDER — FUROSEMIDE 20 MG PO TABS: 20.0000 mg | ORAL_TABLET | Freq: Every day | ORAL | 0 refills | Status: DC

## 2022-06-15 NOTE — Discharge Instructions (Addendum)
As we discussed today, I have checked several labs to try to get a better understanding of what might be causing your swelling.  We will follow-up as we get the results and decide any further testing based on this.  I am also going to prescribe you Lasix which is a fluid pill.  You can take this in addition to your regular medications.  I want you to take 1 pill in the morning.  If you still feel like you have swelling in the evening then you can take another.  Please call Dr. Tonita Phoenix office tomorrow to schedule follow-up.  You will need to go directly to ER should you develop any shortness of breath or worsening swelling.

## 2022-06-15 NOTE — ED Triage Notes (Signed)
Pt reports past few weeks having fluid retention in abdomen and legs/feet. Reports that unable to wear some clothes in past 3 weeks due to swelling. Noticed this started July 10th. Reports was on a diet when changes started.   Reports tingling in feet and mild tightness in chest.  Reports having constipation and urinating little less than normal.

## 2022-06-15 NOTE — ED Provider Notes (Signed)
[facility]    CSN: [id number] Arrival date & time: 06/15/22  1749      History   Chief Complaint Chief Complaint  Patient presents with   Edema    HPI Sherry Daniels is a 61 y.o. female with past medical history of asthma, fatty liver disease, hypertension, hyperlipidemia presents to urgent care today with complaints of swelling. Starting noticing increased swelling on 05/21/22 when she could not zip clothes up that she wore week prior. Felt like that resolved, but started again 2-3 weeks ago.  Uncertain if any weight gain as she has not weighed herself.  Feels like abd is distended.  She also reports swelling bilaterally to lower extremities up to thighs.  She denies any abdominal pain, N/V/D, melena, hematochezia.  Having BMs, but feels a bit constipated with hard stool. Has been using enemas and suppositories about twice weekly for the past month. Denies any CP, but feels "uncomfortable in chest area". Noticed mild sob when walking from car to house today. Denies recent fever, chills. Has hx of fatty liver disease, but states labs have been stable over the past few years.  Taking HCTZ and amlodipine. No new medications    Past Medical History:  Diagnosis Date   Allergy    Asthma    Chest pain 11/2009   overnight hospitalization   Constipation    Dr. 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 Loreta Ave; normal   WISDOM TOOTH EXTRACTION      OB History   No obstetric history on file.      Home Medications    Prior to Admission medications   Medication Sig Start Date End Date Taking? Authorizing Provider  furosemide (LASIX) 20 MG tablet Take 1 tablet (20 mg total) by mouth daily for 14 days. 1 to 2 tablets daily as needed for swelling 06/15/22 06/29/22 Yes Rolla Etienne, NP  acetaminophen (TYLENOL) 500 MG tablet Take 1,500 mg by mouth every 6 (six) hours as needed. Reported on 05/01/2016    [provider]  albuterol (PROVENTIL) (2.5 MG/3ML) 0.083% nebulizer solution Take 3 mLs (2.5 mg total) by nebulization every 6 (six) hours as needed for wheezing or shortness of breath. 11/16/21   Valentino Nose, NP  albuterol (VENTOLIN HFA) 108 (90 Base) MCG/ACT inhaler USE 2 PUFFS EVERY 4 TO 6 HOURS AS NEEDED 05/24/20   Donita Brooks, MD  amLODipine (NORVASC) 5 MG tablet TAKE 1 TABLET BY MOUTH EVERY DAY 06/16/21   Donita Brooks, MD  aspirin 81 MG EC tablet 1 tablet po qOD Patient taking differently: Take 81 mg by mouth daily. 07/05/12   Tysinger, Kermit Balo, PA-C  escitalopram (LEXAPRO) 10 MG tablet  TAKE 1 TABLET BY MOUTH EVERY DAY 06/18/20   Donita Brooks, MD  estradiol (ESTRACE) 0.1 MG/GM vaginal cream Place 1 g vaginally 3 (three) times a week. 12/27/21   [provider]  estradiol (ESTRACE) 1 MG tablet Take 1 mg by mouth daily.    [provider]  hydrochlorothiazide (MICROZIDE) 12.5 MG capsule TAKE 1 CAPSULE BY MOUTH EVERY DAY 10/25/21   Donita Brooks, MD  ibuprofen (ADVIL,MOTRIN) 600 MG tablet Take 1 tablet (600 mg total) by mouth every 6 (six) hours as needed. 10/16/15   Mady Gemma, PA-C  linaclotide (LINZESS) 72 MCG capsule Take 72 mcg by mouth daily before breakfast. Patient not  taking: Reported on 11/16/2021    [provider]  Misc Natural Products (TART CHERRY ADVANCED PO) Take by mouth.    [provider]  Multiple Vitamins-Minerals (MULTIVITAMIN WITH MINERALS) tablet Take 1 tablet by mouth daily.    [provider]  Omega-3 1000 MG CAPS Take by mouth.    [provider]  promethazine (PHENERGAN) 12.5 MG tablet Take 1 tablet (12.5 mg total) by mouth every 8 (eight) hours as needed for nausea or vomiting. 09/30/21   Valentino Nose, NP  topiramate (TOPAMAX) 50 MG tablet Take 1 tablet (50 mg total) by mouth 2 (two) times daily. 09/30/21   Valentino Nose, NP  TURMERIC PO Take by mouth.    [provider]    Family History Family History  Problem Relation Age of Onset   Cancer Mother        stage III, colon   Hypertension Mother    Dementia Mother    Heart disease Mother        bradycardia   Stroke Mother    Cancer Father        prostate   Hypertension Father    Hyperlipidemia Father    Heart disease Father 5       MI    Hyperlipidemia Sister    Hypertension Sister    Fibromyalgia Sister    Arthritis Brother        RA   Hypertension Brother    Hyperlipidemia Brother    Fibromyalgia Brother    Hypertension Brother    Hyperlipidemia Brother    Healthy Daughter    Healthy Son    Lupus Niece     Social History Social History   Tobacco Use   Smoking status: Never   Smokeless tobacco: Never  Vaping Use   Vaping Use: Never used  Substance Use Topics   Alcohol use: No    Alcohol/week: 0.0 standard drinks of alcohol   Drug use: No     Allergies   Aspirin, Codeine, Cymbalta [duloxetine hcl], and Simvastatin   Review of Systems As stated in HPI otherwise negative   Physical Exam Triage Vital Signs ED Triage Vitals  Enc Vitals Group     BP 06/15/22 1806 (!) 167/81     Pulse Rate 06/15/22 1806 65     Resp 06/15/22 1806 16     Temp 06/15/22 1806 97.7 F (36.5 C)     Temp Source  06/15/22 1806 Oral     SpO2 06/15/22 1806 100 %     Weight --      Height --      Head Circumference --      Peak Flow --      Pain Score 06/15/22 1803 0     Pain Loc --  Pain Edu? --      Excl. in GC? --    No data found.  Updated Vital Signs BP (!) 167/81 (BP Location: Right Arm)   Pulse 65   Temp 97.7 F (36.5 C) (Oral)   Resp 16   SpO2 100%   Visual Acuity Right Eye Distance:   Left Eye Distance:   Bilateral Distance:    Right Eye Near:   Left Eye Near:    Bilateral Near:     Physical Exam Constitutional:      General: She is not in acute distress.    Appearance: Normal appearance. She is not ill-appearing or toxic-appearing.  HENT:     Mouth/Throat:     Mouth: Mucous membranes are moist.     Pharynx: Oropharynx is clear.  Eyes:     General: No scleral icterus.    Extraocular Movements: Extraocular movements intact.     Conjunctiva/sclera: Conjunctivae normal.     Pupils: Pupils are equal, round, and reactive to light.  Cardiovascular:     Rate and Rhythm: Normal rate and regular rhythm.     Heart sounds: No murmur heard.    No friction rub. No gallop.     Comments: Trace pretibial edema bilateral lower extremities Pulmonary:     Effort: Pulmonary effort is normal.     Breath sounds: Normal breath sounds. No wheezing, rhonchi or rales.  Abdominal:     General: Bowel sounds are normal. There is distension.     Palpations: Abdomen is soft. There is no mass.     Tenderness: There is no abdominal tenderness. There is no right CVA tenderness, left CVA tenderness, guarding or rebound.  Musculoskeletal:     Cervical back: Normal range of motion and neck supple.  Skin:    General: Skin is warm and dry.     Coloration: Skin is not jaundiced.     Findings: No rash.  Neurological:     General: No focal deficit present.     Mental Status: She is alert and oriented to person, place, and time.  Psychiatric:        Mood and Affect: Mood normal.         Behavior: Behavior normal.      UC Treatments / Results  Labs (all labs ordered are listed, but only abnormal results are displayed) Labs Reviewed  COMPREHENSIVE METABOLIC PANEL - Abnormal; Notable for the following components:      Result Value   Glucose, Bld 101 (*)    All other components within normal limits  CBC WITH DIFFERENTIAL/PLATELET  BRAIN NATRIURETIC PEPTIDE  LIPASE, BLOOD    EKG NSR at 68 bpm, no T wave inversion, no evidence of ischemia  Radiology No results found.  Procedures Procedures (including critical care time)  Medications Ordered in UC Medications - No data to display  Initial Impression / Assessment and Plan / UC Course  I have reviewed the triage vital signs and the nursing notes.  Pertinent labs & imaging results that were available during my care of the patient were reviewed by me and considered in my medical decision making (see chart for details).   Abdominal distention -Symptoms ongoing x1 month with inability to fit in any of her close.  Abdomen is quite protuberant on exam though soft and nonsurgical.  Distention does not appear to be ascites on exam.  Upon phone call with patient after visit, patient does admit to recent constipation with frequent enema and suppository use over  the past month.  She has long history of constipation and has followed with GI in the past.  She has stopped taking her MiraLAX -No evidence of infectious, hepatic or cardiac etiology based on exam and lab results. -Start MiraLAX 17g twice daily -Patient instructed to follow-up with PCP.  If symptoms persistent with MiraLAX treatment consider abdominal x-ray  LE swelling -Patient reports coincided with onset of abdominal distention.  Swelling actually seems quite mild on exam -BNP normal -Follow-up phone call with patient.  Okay to take dose of Lasix to see if this helps improve her swelling though would hold off on taking daily for now.  Would work on solving above  issue to see if this helps improve symptoms     Final Clinical Impressions(s) / UC Diagnoses   Final diagnoses:  Fluid retention  Abdominal distention     Discharge Instructions      As we discussed today, I have checked several labs to try to get a better understanding of what might be causing your swelling.  We will follow-up as we get the results and decide any further testing based on this.  I am also going to prescribe you Lasix which is a fluid pill.  You can take this in addition to your regular medications.  I want you to take 1 pill in the morning.  If you still feel like you have swelling in the evening then you can take another.  Please call Dr. Tonita Phoenix office tomorrow to schedule follow-up.  You will need to go directly to ER should you develop any shortness of breath or worsening swelling.     ED Prescriptions     Medication Sig Dispense Auth. Provider   furosemide (LASIX) 20 MG tablet Take 1 tablet (20 mg total) by mouth daily for 14 days. 1 to 2 tablets daily as needed for swelling 14 tablet Rolla Etienne, NP      PDMP not reviewed this encounter.   Rolla Etienne, NP 06/15/22 2124

## 2022-07-27 ENCOUNTER — Other Ambulatory Visit: Payer: Self-pay | Admitting: Family Medicine

## 2022-07-27 NOTE — Telephone Encounter (Addendum)
Patient called, no answer, recording call could not be completed at this time try call again later. Patient is past due for an appointment, will need scheduling if she returns the call. Will refill 30 day courtesy refill, appt needed.    Requested Prescriptions  Pending Prescriptions Disp Refills   amLODipine (NORVASC) 5 MG tablet [Pharmacy Med Name: AMLODIPINE BESYLATE 5 MG TAB] 90 tablet 3    Sig: TAKE 1 TABLET BY MOUTH EVERY DAY     Cardiovascular: Calcium Channel Blockers 2 Failed - 07/27/2022 12:41 AM      Failed - Last BP in normal range    BP Readings from Last 1 Encounters:  06/15/22 (!) 167/81         Failed - Valid encounter within last 6 months    Recent Outpatient Visits           8 months ago Shortness of breath   [facility] Cathlean Marseilles A, NP   10 months ago Intractable chronic migraine without aura and with status migrainosus   [facility] Valentino Nose, NP   1 year ago Right-sided back pain, unspecified back location, unspecified chronicity   [facility] Pickard, Priscille Heidelberg, MD   1 year ago Irritable bowel syndrome with constipation   [facility] Donita Brooks, MD   2 years ago Fatty liver disease, nonalcoholic   [facility] Pickard, Priscille Heidelberg, MD              Passed - Last Heart Rate in normal range    Pulse Readings from Last 1 Encounters:  06/15/22 65

## 2022-07-29 LAB — HM MAMMOGRAPHY

## 2022-08-15 ENCOUNTER — Other Ambulatory Visit: Payer: Self-pay

## 2022-08-15 ENCOUNTER — Telehealth: Payer: Self-pay

## 2022-08-15 DIAGNOSIS — I10 Essential (primary) hypertension: Secondary | ICD-10-CM

## 2022-08-15 MED ORDER — AMLODIPINE BESYLATE 5 MG PO TABS: 5.0000 mg | ORAL_TABLET | Freq: Every day | ORAL | 3 refills | Status: DC

## 2022-08-15 NOTE — Telephone Encounter (Signed)
Pharmacy faxed a refill request for amLODipine (NORVASC) 5 MG tablet [[id number]]    Order Details Dose: 5 mg Route: Oral Frequency: Daily  Dispense Quantity: 30 tablet Refills: 0        Sig: Take 1 tablet (5 mg total) by mouth daily. OFFICE VISIT NEEDED FOR ADDITIONAL REFILLS       Start Date: 07/27/22 End Date: --  Written Date: 07/27/22 Expiration Date: 07/27/23  Original Order:  amLODipine

## 2022-11-30 ENCOUNTER — Encounter: Payer: Self-pay | Admitting: Family Medicine

## 2022-12-25 IMAGING — US US TRANSVAGINAL NON-OB
1 series · 14 of 18 positions shown · non-contrast
Comparison: 10/28/2020

CLINICAL DATA: Lower abdominal pain, prior total hysterectomy

EXAM:
ULTRASOUND PELVIS TRANSVAGINAL
TECHNIQUE: Transvaginal ultrasound examination of the pelvis was performed
including evaluation of the uterus, ovaries, adnexal regions, and
pelvic cul-de-sac.

[Series 1: us transvaginal non-ob · 14 of 18 slices shown]
[im 1/18]
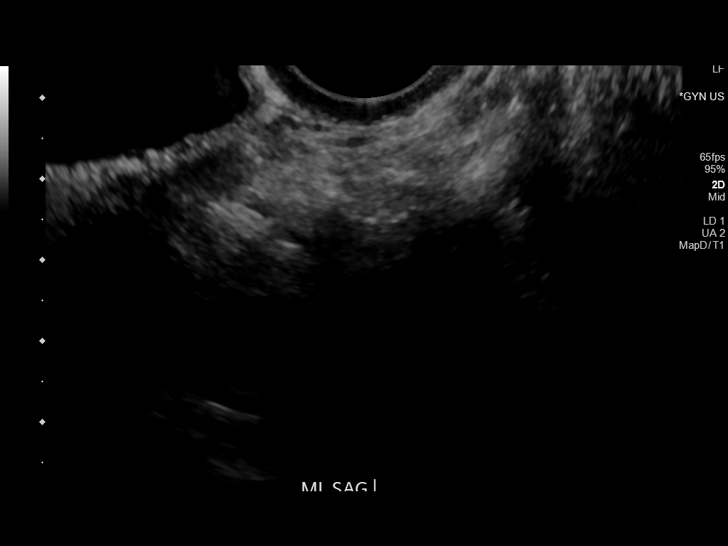
[im 2/18]
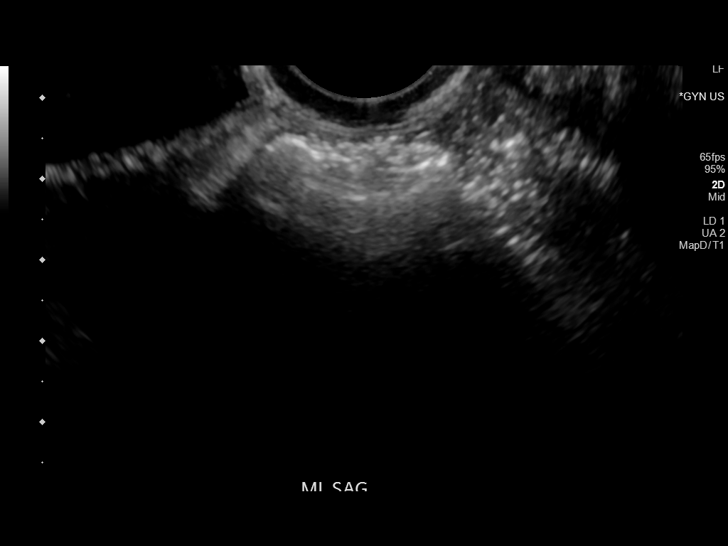
[im 4/18]
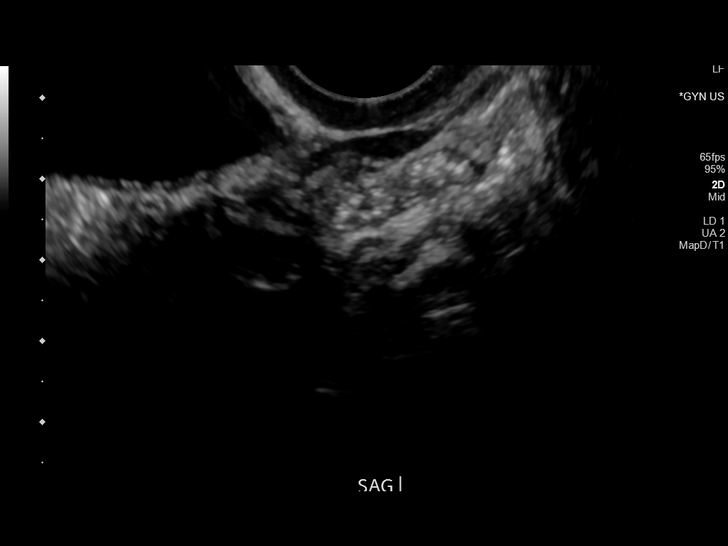
[im 5/18]
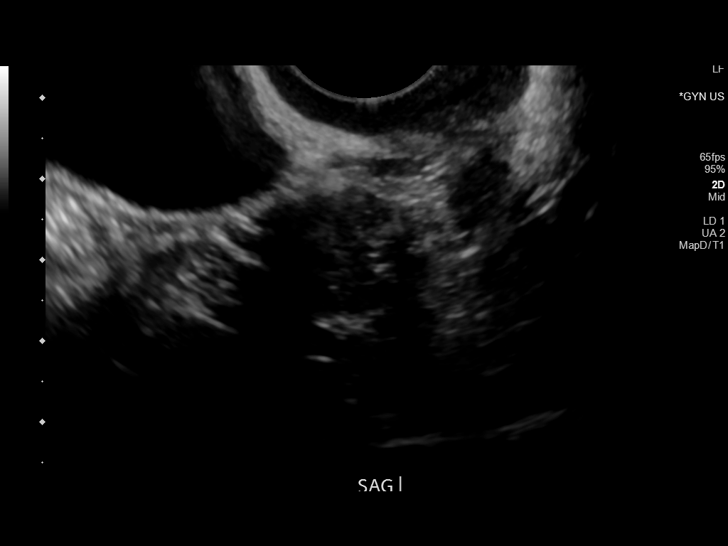
[im 6/18]
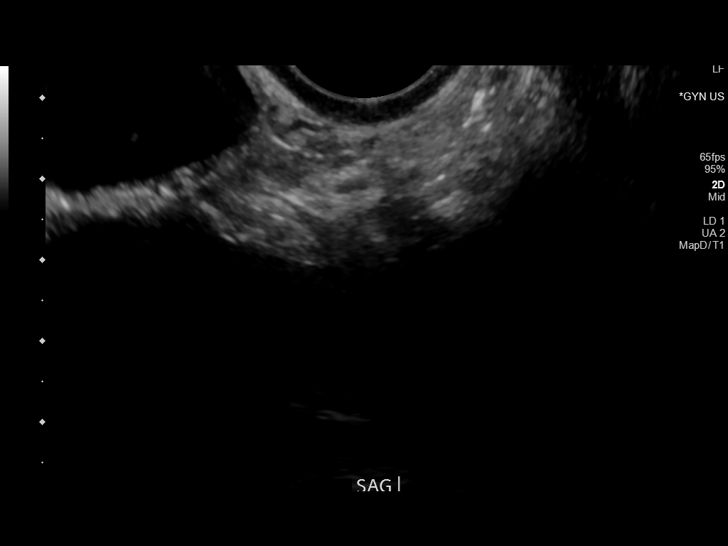
[im 8/18]
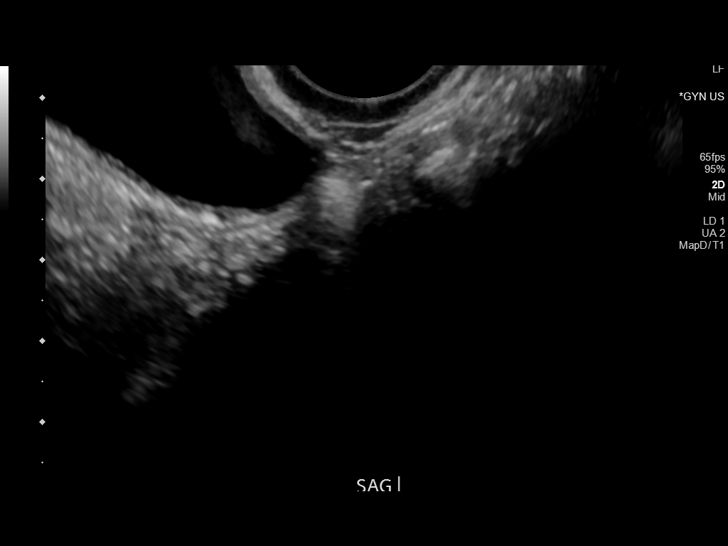
[im 9/18]
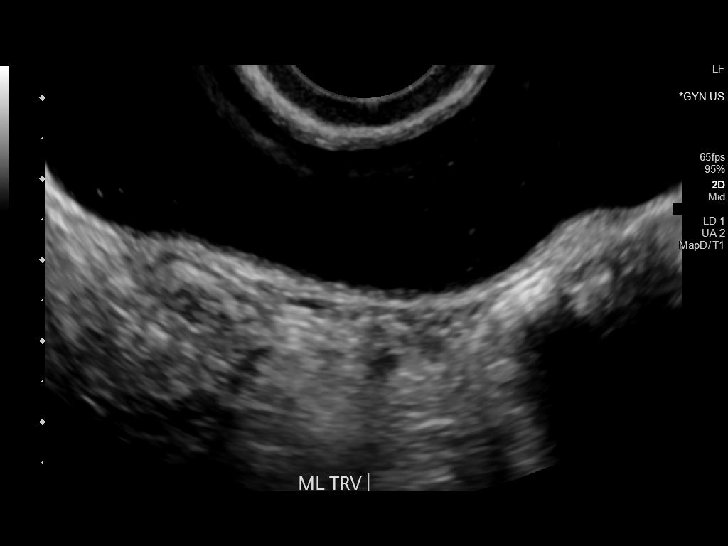
[im 10/18]
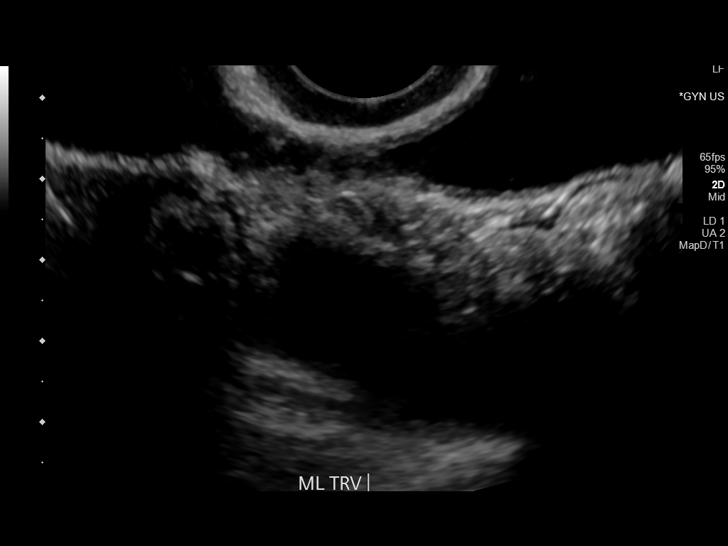
[im 11/18]
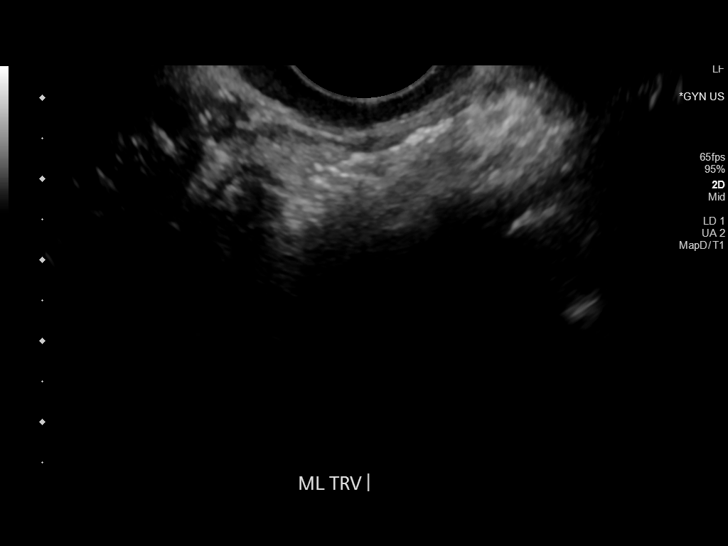
[im 13/18]
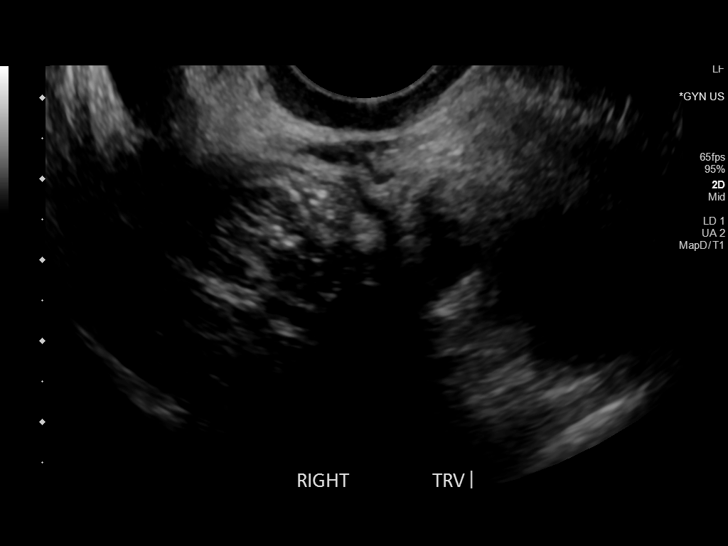
[im 14/18]
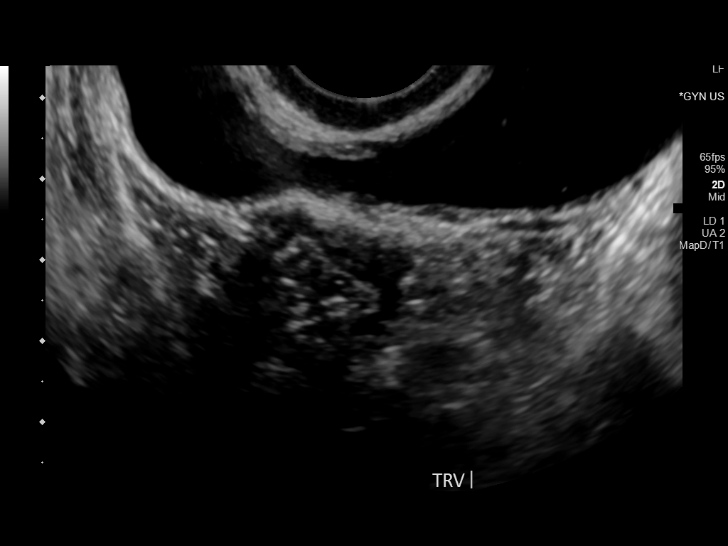
[im 15/18]
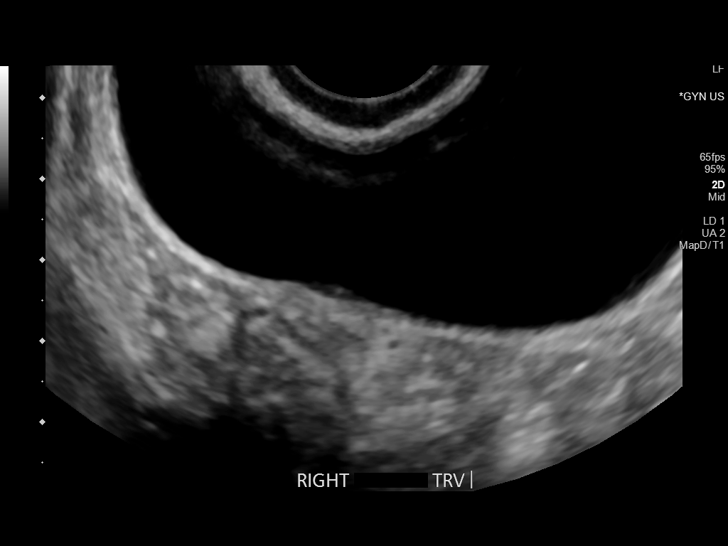
[im 17/18]
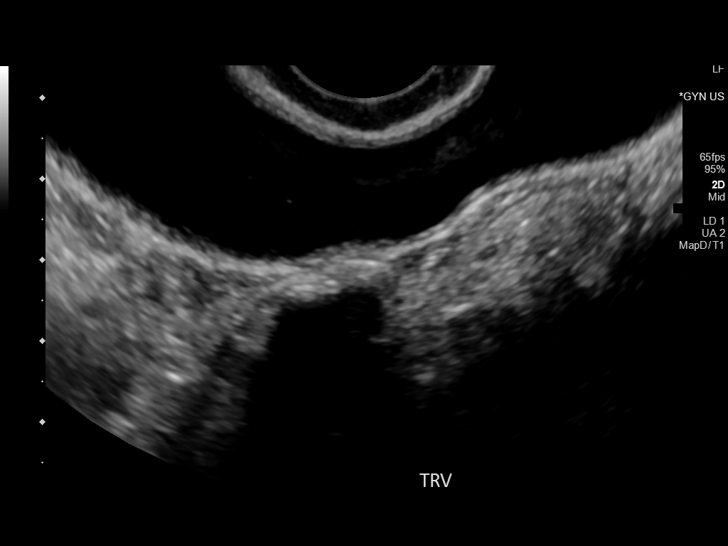
[im 18/18]
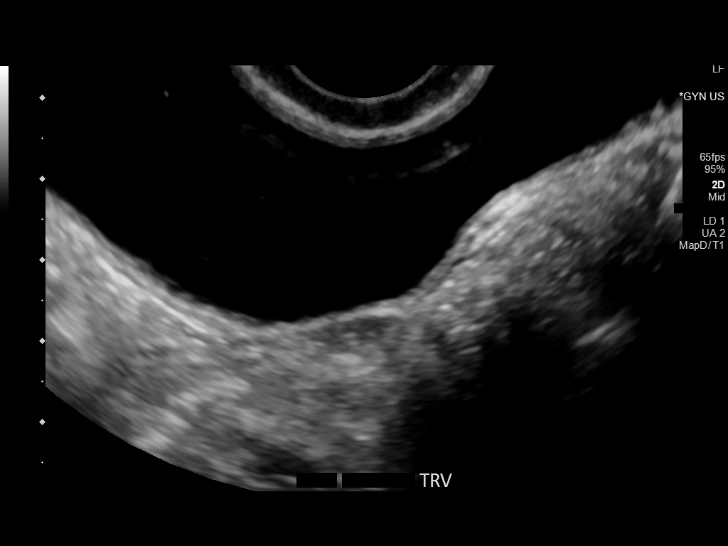

[14 of 18 positions shown; findings below may reference images not displayed]

FINDINGS: Uterus

Surgically absent

Endometrium

N/A

Right ovary

Not visualized, reportedly surgically absent

Left ovary

Not visualized, reportedly surgically absent

Other findings: No free pelvic fluid. No adnexal masses. Visualized
portion of urinary bladder unremarkable.
IMPRESSION: Post hysterectomy and reported BILATERAL oophorectomy.

No pelvic sonographic abnormalities identified.

## 2023-01-25 ENCOUNTER — Other Ambulatory Visit: Payer: Self-pay | Admitting: Family Medicine

## 2023-01-25 DIAGNOSIS — I10 Essential (primary) hypertension: Secondary | ICD-10-CM

## 2023-01-25 NOTE — Telephone Encounter (Signed)
Prescription Request  01/25/2023  LOV: Visit date not found  What is the name of the medication or equipment? amLODipine (NORVASC) 5 MG tablet   Have you contacted your pharmacy to request a refill? Yes   Which pharmacy would you like this sent to?  [id number] [place], [place] [id number] [id number]: [phone]: [phone]   Patient notified that their request is being sent to the clinical staff for review and that they should receive a response within 2 business days.   Please advise at [place]

## 2023-01-25 NOTE — Telephone Encounter (Signed)
Unable to refill per protocol, appointment needed.   Requested Prescriptions  Pending Prescriptions Disp Refills   amLODipine (NORVASC) 5 MG tablet 30 tablet 3    Sig: Take 1 tablet (5 mg total) by mouth daily. OFFICE VISIT NEEDED FOR ADDITIONAL REFILLS     Cardiovascular: Calcium Channel Blockers 2 Failed - 01/25/2023  9:43 AM      Failed - Last BP in normal range    BP Readings from Last 1 Encounters:  06/15/22 (!) 167/81         Failed - Valid encounter within last 6 months    Recent Outpatient Visits           1 year ago Shortness of breath   [place] Eulogio Bear, NP   1 year ago Intractable chronic migraine without aura and with status migrainosus   [place] Noemi Chapel A, NP   2 years ago Right-sided back pain, unspecified back location, unspecified chronicity   [place] Cammie Mcgee, MD   2 years ago Irritable bowel syndrome with constipation   [place] [facility] Schaumann, Cammie Mcgee, MD   2 years ago Fatty liver disease, nonalcoholic   [place] Pickard, Cammie Mcgee, MD              Passed - Last Heart Rate in normal range    Pulse Readings from Last 1 Encounters:  06/15/22 65

## 2023-02-11 ENCOUNTER — Emergency Department (HOSPITAL_COMMUNITY): Payer: BC Managed Care – PPO

## 2023-02-11 ENCOUNTER — Other Ambulatory Visit: Payer: Self-pay

## 2023-02-11 ENCOUNTER — Emergency Department (HOSPITAL_COMMUNITY)
Admission: EM | Admit: 2023-02-11 | Discharge: 2023-02-11 | Disposition: A | Discharge status: Home / Self Care | Payer: BC Managed Care – PPO | Attending: Emergency Medicine | Admitting: Emergency Medicine

## 2023-02-11 DIAGNOSIS — I10 Essential (primary) hypertension: Secondary | ICD-10-CM | POA: Diagnosis not present

## 2023-02-11 DIAGNOSIS — R079 Chest pain, unspecified: Secondary | ICD-10-CM

## 2023-02-11 DIAGNOSIS — R0789 Other chest pain: Secondary | ICD-10-CM | POA: Insufficient documentation

## 2023-02-11 DIAGNOSIS — R030 Elevated blood-pressure reading, without diagnosis of hypertension: Secondary | ICD-10-CM | POA: Diagnosis not present

## 2023-02-11 DIAGNOSIS — J45909 Unspecified asthma, uncomplicated: Secondary | ICD-10-CM | POA: Insufficient documentation

## 2023-02-11 DIAGNOSIS — Z7982 Long term (current) use of aspirin: Secondary | ICD-10-CM | POA: Insufficient documentation

## 2023-02-11 DIAGNOSIS — R0602 Shortness of breath: Secondary | ICD-10-CM | POA: Insufficient documentation

## 2023-02-11 DIAGNOSIS — Z79899 Other long term (current) drug therapy: Secondary | ICD-10-CM | POA: Insufficient documentation

## 2023-02-11 LAB — BASIC METABOLIC PANEL
Anion gap: 10 (ref 5–15)
BUN: 14 mg/dL (ref 6–20)
CO2: 27 mmol/L (ref 22–32)
Calcium: 9.5 mg/dL (ref 8.9–10.3)
Chloride: 103 mmol/L (ref 98–111)
Creatinine, Ser: 0.84 mg/dL (ref 0.44–1.00)
GFR, Estimated: >60 mL/min (ref >60)
Glucose, Bld: 118 mg/dL — ABNORMAL HIGH (ref 70–99)
Potassium: 3.2 mmol/L — ABNORMAL LOW (ref 3.5–5.1)
Sodium: 140 mmol/L (ref 135–145)

## 2023-02-11 LAB — CBC
HCT: 38.4 % (ref 36.0–46.0)
Hemoglobin: 12.1 g/dL (ref 12.0–15.0)
MCH: 28.9 pg (ref 26.0–34.0)
MCHC: 31.5 g/dL (ref 30.0–36.0)
MCV: 91.6 fL (ref 80.0–100.0)
Platelets: 253 10*3/uL (ref 150–400)
RBC: 4.19 MIL/uL (ref 3.87–5.11)
RDW: 13.7 % (ref 11.5–15.5)
WBC: 4.6 10*3/uL (ref 4.0–10.5)
nRBC: 0 % (ref 0.0–0.2)

## 2023-02-11 LAB — TROPONIN I (HIGH SENSITIVITY)
Troponin I (High Sensitivity): 6 ng/L (ref <18)
Troponin I (High Sensitivity): 5 ng/L (ref <18)

## 2023-02-11 LAB — BRAIN NATRIURETIC PEPTIDE: B Natriuretic Peptide: 9.3 pg/mL (ref 0.0–100.0)

## 2023-02-11 NOTE — ED Provider Triage Note (Signed)
Emergency Medicine Provider Triage Evaluation Note  Sherry Daniels , a 62 y.o. female  was evaluated in triage.  Pt complains of substernal chest pressure x 2 days.  Has associated nausea, diaphoresis, shortness of breath. Took bayer ASA last night. Notes her chest is noticed at rest. Denies exertional or chest pain worse with movement. Denies PMHx of MI, DM, stents. Grandfather with MI in early 61s. Pt is on estradiol patches.  Pt denies recent travel, immobilization, surgery, anticoagulant use, malignancy, or PMHx of PE/DVT.    Review of Systems  Positive:  Negative:   Physical Exam  BP (!) 177/95 (BP Location: Right Arm)   Pulse 75   Temp 98.2 F (36.8 C) (Oral)   Resp 18   SpO2 100%  Gen:   Awake, no distress   Resp:  Normal effort  MSK:   Moves extremities without difficulty  Other:  No chest wall TTP.   Medical Decision Making  Medically screening exam initiated at 8:15 PM.  Appropriate orders placed.  Suann Eggleton Mateja was informed that the remainder of the evaluation will be completed by another provider, this initial triage assessment does not replace that evaluation, and the importance of remaining in the ED until their evaluation is complete.  8:19 PM - Discussed with RN that patient is in need of a room immediately. RN aware and working on room placement.    Ludwig Tugwell A, PA-C 02/11/23 2020

## 2023-02-11 NOTE — Discharge Instructions (Addendum)
It was a pleasure caring for you today in the emergency department.  Please return to the emergency department for any worsening or worrisome symptoms.  Please follow-up with your PCP in regards to your elevated blood pressure, may need to have your blood pressure medication regimen adjusted

## 2023-02-11 NOTE — ED Triage Notes (Signed)
Patient reports substernal chest pressure with mild SOB , nausea and diaphoresis onset 2 days ago .

## 2023-02-11 NOTE — ED Provider Notes (Signed)
[facility] Provider Note  CSN: [id number] Arrival date & time: 02/11/23 2006  Chief Complaint(s) Chest Pain  HPI Leaya Mccorkle is a 62 y.o. female with past medical history as below, significant for hypertension, asthma, fibromyalgia, GERD, HLD who presents to the ED with complaint of chest discomfort.  Patient reports over the past 2 days she has been having intermittent chest tightness described as a "bear hug."  Felt slightly diaphoretic while she was have the discomfort.  Checked her blood pressure is elevated.  Took extra dose of her blood pressure medication and the symptoms improved significantly.  Currently asymptomatic, no palpitations, no falls, no excessive stimulant use, no current dyspnea, nausea or diaphoresis.  Past Medical History Past Medical History:  Diagnosis Date   Allergy    Asthma    Chest pain 11/2009   overnight hospitalization   Constipation    Dr. 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 Authorizing Provider  acetaminophen (TYLENOL) 500 MG tablet Take 1,500 mg by mouth every 6 (six) hours as needed. Reported on 05/01/2016    [provider]  albuterol (PROVENTIL) (2.5 MG/3ML) 0.083% nebulizer solution  Take 3 mLs (2.5 mg total) by nebulization every 6 (six) hours as needed for wheezing or shortness of breath. 11/16/21   Eulogio Bear, NP  albuterol (VENTOLIN HFA) 108 (90 Base) MCG/ACT inhaler USE 2 PUFFS EVERY 4 TO 6 HOURS AS NEEDED 05/24/20   Susy Frizzle, MD  amLODipine (NORVASC) 5 MG tablet Take 1 tablet (5 mg total) by mouth daily. OFFICE VISIT NEEDED FOR ADDITIONAL REFILLS 08/15/22   Susy Frizzle, MD  aspirin 81 MG EC tablet 1 tablet po qOD Patient taking differently: Take 81 mg by mouth daily. 07/05/12   Tysinger, Camelia Eng, PA-C  escitalopram (LEXAPRO) 10 MG tablet TAKE 1 TABLET BY MOUTH EVERY DAY 06/18/20   Susy Frizzle, MD  estradiol (ESTRACE) 0.1 MG/GM vaginal cream Place 1 g vaginally 3 (three) times a week. 12/27/21   [provider]  estradiol (ESTRACE) 1 MG tablet Take 1 mg by mouth daily.    [provider]  hydrochlorothiazide (MICROZIDE) 12.5 MG capsule TAKE 1 CAPSULE BY MOUTH EVERY DAY 10/25/21   Susy Frizzle, MD  ibuprofen (ADVIL,MOTRIN) 600 MG tablet Take 1 tablet (600 mg total) by mouth every 6 (six) hours as needed. 10/16/15   Marella Chimes, PA-C  linaclotide (LINZESS) 72 MCG capsule Take 72 mcg by mouth daily before breakfast. Patient not taking: Reported on 11/16/2021    [provider]  Misc Natural Products (TART CHERRY ADVANCED PO) Take by mouth.    [provider]  Multiple Vitamins-Minerals (MULTIVITAMIN WITH MINERALS) tablet Take 1 tablet by mouth daily.    [provider]  Omega-3 1000 MG CAPS Take by mouth.    [provider]  promethazine (PHENERGAN) 12.5 MG tablet Take 1 tablet (12.5 mg total) by mouth every 8 (eight) hours as needed for nausea or vomiting. 09/30/21   Eulogio Bear, NP  topiramate (TOPAMAX) 50 MG tablet Take 1 tablet (50 mg total) by mouth 2 (two) times daily. 09/30/21   Eulogio Bear, NP  TURMERIC PO Take by mouth.    [provider]                                                                                                                                     Past Surgical History Past Surgical History:  Procedure Laterality Date   ABDOMINAL HYSTERECTOMY     total   COLONOSCOPY  09/15/2009   Dr. Collene Mares; normal   WISDOM TOOTH EXTRACTION     Family History Family History  Problem Relation Age of Onset   Cancer Mother        stage III, colon   Hypertension Mother    Dementia Mother    Heart disease Mother        bradycardia   Stroke Mother    Cancer Father        prostate   Hypertension Father    Hyperlipidemia Father    Heart disease Father 35       MI    Hyperlipidemia Sister    Hypertension Sister    Fibromyalgia Sister    Arthritis Brother        RA   Hypertension Brother    Hyperlipidemia Brother    Fibromyalgia Brother    Hypertension Brother    Hyperlipidemia Brother    Healthy Daughter    Healthy Son    Lupus Niece     Social History Social History   Tobacco Use   Smoking status: Never   Smokeless tobacco: Never  Vaping Use   Vaping Use: Never used  Substance Use Topics   Alcohol use: No    Alcohol/week: 0.0 standard drinks of alcohol   Drug use: No   Allergies Aspirin, Codeine, Cymbalta [duloxetine hcl], and Simvastatin  Review of Systems Review of Systems  Constitutional:  Negative for activity change and fever.  HENT:  Negative for facial swelling and trouble swallowing.   Eyes:  Negative for discharge and redness.  Respiratory:  Positive for chest tightness. Negative for cough and shortness of breath.   Cardiovascular:  Positive for chest pain. Negative for palpitations.  Gastrointestinal:  Negative for abdominal pain and nausea.  Genitourinary:  Negative for dysuria and flank pain.  Musculoskeletal:  Negative for back pain and gait problem.  Skin:  Negative for pallor and rash.  Neurological:  Negative for syncope and headaches.    Physical Exam Vital Signs  I have reviewed the  triage vital signs BP (!) 144/71   Pulse 65  Temp 98.2 F (36.8 C) (Oral)   Resp (!) 21   SpO2 100%  Physical Exam Vitals and nursing note reviewed.  Constitutional:      General: She is not in acute distress.    Appearance: Normal appearance. She is not ill-appearing.  HENT:     Head: Normocephalic and atraumatic.     Right Ear: External ear normal.     Left Ear: External ear normal.     Nose: Nose normal.     Mouth/Throat:     Mouth: Mucous membranes are moist.  Eyes:     General: No scleral icterus.       Right eye: No discharge.        Left eye: No discharge.  Cardiovascular:     Rate and Rhythm: Normal rate and regular rhythm.     Pulses: Normal pulses.     Heart sounds: Normal heart sounds.  Pulmonary:     Effort: Pulmonary effort is normal. No respiratory distress.     Breath sounds: Normal breath sounds.  Abdominal:     General: Abdomen is flat.     Tenderness: There is no abdominal tenderness.  Musculoskeletal:     Right lower leg: No edema.     Left lower leg: No edema.  Skin:    General: Skin is warm and dry.     Capillary Refill: Capillary refill takes less than 2 seconds.  Neurological:     Mental Status: She is alert.  Psychiatric:        Mood and Affect: Mood normal.        Behavior: Behavior normal.     ED Results and Treatments Labs (all labs ordered are listed, but only abnormal results are displayed) Labs Reviewed  BASIC METABOLIC PANEL - Abnormal; Notable for the following components:      Result Value   Potassium 3.2 (*)    Glucose, Bld 118 (*)    All other components within normal limits  CBC  BRAIN NATRIURETIC PEPTIDE  TROPONIN I (HIGH SENSITIVITY)  TROPONIN I (HIGH SENSITIVITY)                                                                                                                          Radiology DG Chest Port 1 View  Result Date: 02/11/2023 CLINICAL DATA:  Substernal chest pressure for 2 days EXAM: PORTABLE CHEST 1  VIEW COMPARISON:  11/16/2021 FINDINGS: The heart size and mediastinal contours are within normal limits. Both lungs are clear. The visualized skeletal structures are unremarkable. IMPRESSION: No active disease. Electronically Signed   By: Randa Ngo M.D.   On: 02/11/2023 21:23    Pertinent labs & imaging results that were available during my care of the patient were reviewed by me and considered in my medical decision making (see MDM for details).  Medications Ordered in ED Medications - No data to display  Procedures Procedures  (including critical care time)  Medical Decision Making / ED Course    Medical Decision Making:    Marlesa Chavanne is a 62 y.o. female  with past medical history as below, significant for hypertension, asthma, fibromyalgia, GERD, HLD who presents to the ED with complaint of chest discomfort.. The complaint involves an extensive differential diagnosis and also carries with it a high risk of complications and morbidity.  Serious etiology was considered. Ddx includes but is not limited to: Differential includes all life-threatening causes for chest pain. This includes but is not exclusive to acute coronary syndrome, aortic dissection, pulmonary embolism, cardiac tamponade, community-acquired pneumonia, pericarditis, musculoskeletal chest wall pain, etc.   Complete initial physical exam performed, notably the patient  was no acute distress, asymptomatic.    Reviewed and confirmed nursing documentation for past medical history, family history, social history.  Vital signs reviewed.      The patient's chest pain is not suggestive of pulmonary embolus, cardiac ischemia, aortic dissection, pericarditis, myocarditis, pulmonary embolism, pneumothorax, pneumonia, Zoster, or esophageal perforation, or other serious etiology.  Historically  not abrupt in onset, tearing or ripping, pulses symmetric. EKG nonspecific for ischemia/infarction. No dysrhythmias, brugada, WPW, prolonged QT noted.   Troponin negative x2. CXR reviewed. Labs without demonstration of acute pathology unless otherwise noted above.   She has a heart score 4, Trope negative, EKG stable, currently pain-free.  Symptoms could be associated with blood pressure, no evidence of endorgan damage or hypertensive emergency.  Advised cardiology and PCP follow-up  Given the extremely low risk of these diagnoses further testing and evaluation for these possibilities does not appear to be indicated at this time. Patient in no distress and overall condition improved here in the ED. Detailed discussions were had with the patient regarding current findings, and need for close f/u with PCP or on call doctor. The patient has been instructed to return immediately if the symptoms worsen in any way for re-evaluation. Patient verbalized understanding and is in agreement with current care plan. All questions answered prior to discharge.   Additional history obtained: -Additional history obtained from spouse -External records from outside source obtained and reviewed including: Chart review including previous notes, labs, imaging, consultation notes including primary care documentation, prior labs and imaging, home medications   Lab Tests: -I ordered, reviewed, and interpreted labs.   The pertinent results include:   Labs Reviewed  BASIC METABOLIC PANEL - Abnormal; Notable for the following components:      Result Value   Potassium 3.2 (*)    Glucose, Bld 118 (*)    All other components within normal limits  CBC  BRAIN NATRIURETIC PEPTIDE  TROPONIN I (HIGH SENSITIVITY)  TROPONIN I (HIGH SENSITIVITY)    Notable for trop neg, K low  EKG   EKG Interpretation  Date/Time:  Sunday February 11 2023 19:55:21 EDT Ventricular Rate:  73 PR Interval:  170 QRS Duration: 72 QT  Interval:  390 QTC Calculation: 429 R Axis:   -5 Text Interpretation: Normal sinus rhythm Possible Left atrial enlargement Borderline ECG When compared with ECG of 15-Jun-2022 19:10, PREVIOUS ECG IS PRESENT no stemi Confirmed by Wynona Dove ([id number]) on 02/11/2023 11:28:16 PM         Imaging Studies ordered: I ordered imaging studies including chest x-ray I independently visualized the following imaging with scope of interpretation limited to determining acute life threatening conditions related to emergency care; findings noted above, significant for stable I independently visualized and interpreted imaging.  I agree with the radiologist interpretation   Medicines ordered and prescription drug management: No orders of the defined types were placed in this encounter.   -I have reviewed the patients home medicines and have made adjustments as needed   Consultations Obtained: na   Cardiac Monitoring: The patient was maintained on a cardiac monitor.  I personally viewed and interpreted the cardiac monitored which showed an underlying rhythm of: NSR  Social Determinants of Health:  Diagnosis or treatment significantly limited by social determinants of health: na   Reevaluation: After the interventions noted above, I reevaluated the patient and found that they have resolved  Co morbidities that complicate the patient evaluation  Past Medical History:  Diagnosis Date   Allergy    Asthma    Chest pain 11/2009   overnight hospitalization   Constipation    Dr. Collene Mares   Fibromyalgia    GERD (gastroesophageal reflux disease)    Hyperlipidemia    Hypertension    Migraine    Nonalcoholic fatty liver disease       Dispostion: Disposition decision including need for hospitalization was considered, and patient discharged from emergency department.    Final Clinical Impression(s) / ED Diagnoses Final diagnoses:  Elevated blood pressure reading  Chest pain, unspecified type      This chart was dictated using voice recognition software.  Despite best efforts to proofread,  errors can occur which can change the documentation meaning.    Jeanell Sparrow, DO 02/12/23 2037

## 2023-02-13 ENCOUNTER — Telehealth: Payer: Self-pay

## 2023-02-13 NOTE — Transitions of Care (Post Inpatient/ED Visit) (Signed)
   02/13/2023  Name: Sherry Daniels MRN: [id number] DOB: Jan 17, 1961  Today's TOC FU Call Status: Today's TOC FU Call Status:: Successful TOC FU Call Competed TOC FU Call Complete Date: 02/13/23  Transition Care Management Follow-up Telephone Call Date of Discharge: 02/11/23 [facility]) Type of Discharge: Emergency Department Reason for ED Visit: Cardiac Conditions Cardiac Conditions Diagnosis:  (Elevated blood pressure reading  Chest pain, unspecified type) How have you been since you were released from the hospital?: Same Any questions or concerns?: Yes Patient Questions/Concerns:: blood pressure is still elevated at home Patient Questions/Concerns Addressed: Notified Provider of Patient Questions/Concerns  Items Reviewed: Did you receive and understand the discharge instructions provided?: Yes Medications obtained and verified?: Yes (Medications Reviewed) Any new allergies since your discharge?: No Dietary orders reviewed?: NA Do you have support at home?: Yes  Home Care and Equipment/Supplies: [place] Ordered?: NA Any new equipment or medical supplies ordered?: NA  Functional Questionnaire: Do you need assistance with bathing/showering or dressing?: No Do you need assistance with meal preparation?: No Do you need assistance with eating?: No Do you have difficulty maintaining continence: No Do you need assistance with getting out of bed/getting out of a chair/moving?: No Do you have difficulty managing or taking your medications?: No  Follow up appointments reviewed: PCP Follow-up appointment confirmed?: Yes Date of PCP follow-up appointment?: 02/19/23 Follow-up Provider: Dr. Cletus Gash [facility] Follow-up appointment confirmed?: Yes Date of Specialist follow-up appointment?: 02/23/23 Follow-Up Specialty Provider:: Coletta Memos, NP - Cardiology Do you need transportation to your follow-up appointment?:  No Do you understand care options if your condition(s) worsen?: Yes-patient verbalized understanding    Norton Blizzard, [place] ([place])  [place] [phone]

## 2023-02-19 ENCOUNTER — Inpatient Hospital Stay: Payer: BC Managed Care – PPO | Admitting: Family Medicine

## 2023-02-21 NOTE — Progress Notes (Signed)
Cardiology Clinic Note   Patient Name: Sherry Daniels Date of Encounter: 02/23/2023  Primary Care Provider:  Donita Brooks, MD Primary Cardiologist:  Reatha Harps, MD  Patient Profile    Sherry Daniels 62 year old female presents to the clinic today for follow-up evaluation of her elevated blood pressure and chest discomfort.  Past Medical History    Past Medical History:  Diagnosis Date   Allergy    Asthma    Chest pain 11/2009   overnight hospitalization   Constipation    Dr. Loreta Ave   Fibromyalgia    GERD (gastroesophageal reflux disease)    Hyperlipidemia    Hypertension    Migraine    Nonalcoholic fatty liver disease    Past Surgical History:  Procedure Laterality Date   ABDOMINAL HYSTERECTOMY     total   COLONOSCOPY  09/15/2009   Dr. Loreta Ave; normal   WISDOM TOOTH EXTRACTION      Allergies  Allergies  Allergen Reactions   Aspirin Nausea Only   Codeine Nausea Only   Cymbalta [Duloxetine Hcl]     Felt like it worsened anxiety and fibromyalgia   Simvastatin Other (See Comments)    dizziness    History of Present Illness    Sherry Daniels has a PMH of HTN, asthma, fibromyalgia, GERD, hyperlipidemia, and nonalcoholic fatty liver disease.  She was seen by Dr. Flora Lipps 09/05/2021.  During that time she reported that her maternal grandfather had heart disease in his 35s.  She denied tobacco alcohol and drug use.  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.  She reported chest tightness that could occur with exertion or with rest.  Coronary CTA was ordered and showed a coronary calcium score of 0.  She wore a cardiac event monitor which showed no arrhythmias and rare ectopy.  She presented to the emergency department on 02/11/2023 and was discharged the same day.  She noted chest discomfort.  She also reported that over the previous 2 days she had been having intermittent chest tightness and reported slight diaphoresis.   When she checked her blood pressure it was noted to be elevated.  She had taken an extra dose of her blood pressure medication and her symptoms improved.  At the time of evaluation she was asymptomatic, denied palpitations, recent falls, excessive stimulant use, dyspnea and nausea.  Her blood pressure was noted to be 144/71.  Her chest x-ray showed no active disease.  Her troponins were low and flat.  Her EKG showed normal sinus rhythm 73 bpm.  Her BMP and CBC were unremarkable.  She was instructed to follow-up with her PCP.  She presents to the clinic today for follow-up evaluation and states she has had some dietary indiscretion had increased family stress, and had stopped her exercise regimen.  We reviewed her emergency department visit and she expressed understanding.  We reviewed her previous coronary CTA.  I recommended low-salt diet and she plans to restart her walking 3 miles daily regimen.  She reports that her blood pressure at home is routinely in the 150s over 80s-100 range.  I will increase her amlodipine to 7.5 mg daily and increase HCTZ to 25 mg daily.  Will give the salty 6 diet she and order BMP for 2 weeks.  I will give her a blood pressure log and asked her to send 2-week blood pressure log in for review.  Will plan follow-up in 12 months.  Today she denies chest pain, shortness  of breath, lower extremity edema, fatigue, palpitations, melena, hematuria, hemoptysis, diaphoresis, weakness, presyncope, syncope, orthopnea, and PND.    Home Medications    Prior to Admission medications   Medication Sig Start Date End Date Taking? Authorizing Provider  acetaminophen (TYLENOL) 500 MG tablet Take 1,500 mg by mouth every 6 (six) hours as needed. Reported on 05/01/2016    [provider]  albuterol (PROVENTIL) (2.5 MG/3ML) 0.083% nebulizer solution Take 3 mLs (2.5 mg total) by nebulization every 6 (six) hours as needed for wheezing or shortness of breath. 11/16/21   Valentino Nose,  NP  albuterol (VENTOLIN HFA) 108 (90 Base) MCG/ACT inhaler USE 2 PUFFS EVERY 4 TO 6 HOURS AS NEEDED 05/24/20   Donita Brooks, MD  amLODipine (NORVASC) 5 MG tablet Take 1 tablet (5 mg total) by mouth daily. OFFICE VISIT NEEDED FOR ADDITIONAL REFILLS 08/15/22   Donita Brooks, MD  aspirin 81 MG EC tablet 1 tablet po qOD Patient taking differently: Take 81 mg by mouth daily. 07/05/12   Tysinger, Kermit Balo, PA-C  escitalopram (LEXAPRO) 10 MG tablet TAKE 1 TABLET BY MOUTH EVERY DAY 06/18/20   Donita Brooks, MD  estradiol (ESTRACE) 0.1 MG/GM vaginal cream Place 1 g vaginally 3 (three) times a week. 12/27/21   [provider]  estradiol (ESTRACE) 1 MG tablet Take 1 mg by mouth daily.    [provider]  hydrochlorothiazide (MICROZIDE) 12.5 MG capsule TAKE 1 CAPSULE BY MOUTH EVERY DAY 10/25/21   Donita Brooks, MD  ibuprofen (ADVIL,MOTRIN) 600 MG tablet Take 1 tablet (600 mg total) by mouth every 6 (six) hours as needed. 10/16/15   Mady Gemma, PA-C  linaclotide (LINZESS) 72 MCG capsule Take 72 mcg by mouth daily before breakfast. Patient not taking: Reported on 11/16/2021    [provider]  Misc Natural Products (TART CHERRY ADVANCED PO) Take by mouth.    [provider]  Multiple Vitamins-Minerals (MULTIVITAMIN WITH MINERALS) tablet Take 1 tablet by mouth daily.    [provider]  Omega-3 1000 MG CAPS Take by mouth.    [provider]  promethazine (PHENERGAN) 12.5 MG tablet Take 1 tablet (12.5 mg total) by mouth every 8 (eight) hours as needed for nausea or vomiting. 09/30/21   Valentino Nose, NP  topiramate (TOPAMAX) 50 MG tablet Take 1 tablet (50 mg total) by mouth 2 (two) times daily. 09/30/21   Valentino Nose, NP  TURMERIC PO Take by mouth.    [provider]    Family History    Family History  Problem Relation Age of Onset   Cancer Mother        stage III, colon   Hypertension Mother    Dementia Mother     Heart disease Mother        bradycardia   Stroke Mother    Cancer Father        prostate   Hypertension Father    Hyperlipidemia Father    Heart disease Father 68       MI    Hyperlipidemia Sister    Hypertension Sister    Fibromyalgia Sister    Arthritis Brother        RA   Hypertension Brother    Hyperlipidemia Brother    Fibromyalgia Brother    Hypertension Brother    Hyperlipidemia Brother    Healthy Daughter    Healthy Son    Lupus Niece    She indicated that  her mother is deceased. She indicated that her father is deceased. She indicated that her sister is alive. She indicated that both of her brothers are alive. She indicated that her daughter is alive. She indicated that her son is alive. She indicated that her niece is alive.  Social History    Social History   Socioeconomic History   Marital status: Married    Spouse name: Not on file   Number of children: 2   Years of education: Not on file   Highest education level: Not on file  Occupational History   Occupation: supervise principles in school system    Employer: [employer]  Tobacco Use   Smoking status: Never   Smokeless tobacco: Never  Vaping Use   Vaping Use: Never used  Substance and Sexual Activity   Alcohol use: No    Alcohol/week: 0.0 standard drinks of alcohol   Drug use: No   Sexual activity: Yes    Birth control/protection: Surgical  Other Topics Concern   Not on file  Social History Narrative   Married, 1 daughter and 1 son, pentecostal, exercise most days, walking, biking, running.    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  Cardiovascular:  No chest pain, dyspnea on exertion, edema, orthopnea, palpitations, paroxysmal nocturnal dyspnea. Dermatological: No rash, lesions/masses Respiratory: No cough, dyspnea Urologic: No hematuria, dysuria Abdominal:   No nausea, vomiting, diarrhea, bright red blood per rectum, melena, or hematemesis Neurologic:  No visual changes, wkns, changes in mental status. All other systems reviewed and are otherwise negative except as noted above.  Physical Exam    VS:  BP (!) 142/88   Pulse 69   Ht 5\' 6"  (1.676 m)   Wt 174 lb 9.6 oz (79.2 kg)   SpO2 95%   BMI 28.18 kg/m  , BMI Body mass index is 28.18 kg/m. GEN: Well nourished, well developed, in no acute distress. HEENT: normal. Neck: Supple, no JVD, carotid bruits, or masses. Cardiac: RRR, no murmurs, rubs, or gallops. No clubbing, cyanosis, edema.  Radials/DP/PT 2+ and equal bilaterally.  Respiratory:  Respirations regular and unlabored, clear to auscultation bilaterally. GI: Soft, nontender, nondistended, BS + x 4. MS: no deformity or atrophy. Skin: warm and dry, no rash. Neuro:  Strength and sensation are intact. Psych: Normal affect.  Accessory Clinical Findings    Recent Labs: 06/15/2022: ALT 29 02/11/2023: B Natriuretic Peptide 9.3; BUN 14; Creatinine, Ser 0.84; Hemoglobin 12.1; Platelets 253; Potassium 3.2; Sodium 140   Recent Lipid Panel    Component Value Date/Time   CHOL 246 (H) 09/05/2021 0840   TRIG 75 09/05/2021 0840   HDL 92 09/05/2021 0840   CHOLHDL 2.7 09/05/2021 0840   CHOLHDL 3.4 05/24/2020 0941   VLDL 18 03/29/2017 0821   LDLCALC 141 (H) 09/05/2021 0840   LDLCALC 175 (H) 05/24/2020 0941    HYPERTENSION CONTROL Vitals:   02/23/23 0812 02/23/23 0840  BP: (!) 158/80 (!) 142/88    The patient's blood pressure is elevated above target today.  In order to address the patient's elevated BP: Blood  pressure will be monitored at home to determine if medication changes need to be made.; A  current anti-hypertensive medication was adjusted today.       ECG personally reviewed by me today-none today.  Coronary CTA 09/16/2021  IMPRESSION: 1. Calcium score 0  2. Normal ascending thoracic aorta 3.1 cm  3. Normal right dominant coronary arteries  Charlton HawsPeter Nishan   Electronically Signed By: Charlton HawsPeter Nishan M.D. On: 09/16/2021 11:07  Assessment & Plan   1.  Chest discomfort, precordial pain-no chest pain today.  Recently presented to the emergency department and was evaluated for chest discomfort.  Lab work and diagnostics reassuring.  Previous coronary CTA 09/16/2021 showed a coronary calcium score of 0. No plans for ischemic evaluation  Essential hypertension-BP today 158/80 and 142/88. Increase amlodipine to 7.5 mg, increase HCTZ to 25 mg daily Heart healthy low-sodium diet-salty 6 given Increase physical activity as tolerated-plans to resume 3 mile walking daily. Order BMP in 2 weeks  Palpitations-denies recent episodes of irregular or accelerated heart rate.  Previous cardiac event monitor showed no arrhythmias and rare ectopy. Avoid triggers Maintain p.o. hydration Maintain physical activity  Hyperlipidemia-LDL 141 on 09/05/21. Aspirin, omega-3 fatty acids, turmeric Heart healthy low-sodium high-fiber diet Increase physical activity as tolerated Follows with PCP  Disposition: Follow-up with Dr. Flora Lipps'Neal in 1 year.   Thomasene RippleJesse M. Yanis Larin NP-C     02/23/2023, 8:45 AM [facility] [address] Office [phone] Fax [phone]) [phone]    I spent 14 minutes examining this patient, reviewing medications, and using patient centered shared decision making involving her cardiac care.  Prior to her visit I spent greater than 20 minutes reviewing her past medical history,  medications, and prior cardiac tests.

## 2023-02-23 ENCOUNTER — Encounter: Payer: Self-pay | Admitting: General Practice

## 2023-02-23 ENCOUNTER — Inpatient Hospital Stay: Payer: BC Managed Care – PPO | Admitting: Family Medicine

## 2023-02-23 ENCOUNTER — Ambulatory Visit: Payer: BC Managed Care – PPO | Attending: General Practice | Admitting: General Practice

## 2023-02-23 VITALS — BP 142/88 | HR 69 | Ht 66.0 in | Wt 174.6 lb

## 2023-02-23 DIAGNOSIS — R002 Palpitations: Secondary | ICD-10-CM

## 2023-02-23 DIAGNOSIS — E782 Mixed hyperlipidemia: Secondary | ICD-10-CM

## 2023-02-23 DIAGNOSIS — R072 Precordial pain: Secondary | ICD-10-CM | POA: Diagnosis not present

## 2023-02-23 DIAGNOSIS — I10 Essential (primary) hypertension: Secondary | ICD-10-CM

## 2023-02-23 MED ORDER — AMLODIPINE BESYLATE 5 MG PO TABS: 7.5000 mg | ORAL_TABLET | Freq: Every day | ORAL | 3 refills | Status: DC

## 2023-02-23 MED ORDER — HYDROCHLOROTHIAZIDE 25 MG PO TABS: 25.0000 mg | ORAL_TABLET | Freq: Every day | ORAL | 3 refills | Status: DC

## 2023-02-23 NOTE — Patient Instructions (Addendum)
Medication Instructions:  INCREASE AMLODIPINE 7.5MG  DAILY (1-1/2 TAB)  INCREASE HYDROCHLOROTHIAZIDE 25MG  DAILY *If you need a refill on your cardiac medications before your next appointment, please call your pharmacy*  Lab Work: BMET IN 2 WEEKS If you have labs (blood work) drawn today and your tests are completely normal, you will receive your results only IR:JJOACZY Message (if you have MyChart) OR  A paper copy in the mail If you have any lab test that is abnormal or we need to change your treatment, we will call you to review the results.  Other Instructions TAKE AND LOG YOUR BLOOD PRESSURE AND IN 2 WEEKS PLEASE FORWARD VALUDS, MYCHART ETC...  PLEASE START WALKING AGAIN  PLEASE READ AND FOLLOW ATTACHED  SALTY 6   Follow-Up: At [facility], you and your health needs are our priority.  As part of our continuing mission to provide you with exceptional heart care, we have created designated Provider Care Teams.  These Care Teams include your primary Cardiologist (physician) and Advanced Practice Providers (APPs -  Physician Assistants and Nurse Practitioners) who all work together to provide you with the care you need, when you need it.  Your next appointment:   9-12 month(s)  Provider:   Reatha Harps, MD

## 2023-02-28 ENCOUNTER — Telehealth: Payer: Self-pay | Admitting: Family Medicine

## 2023-02-28 NOTE — Telephone Encounter (Signed)
Called patient to reschedule missed appointment; no answer. Phone just rang (no voicemail prompt).

## 2023-05-02 ENCOUNTER — Encounter (HOSPITAL_COMMUNITY): Payer: Self-pay | Admitting: *Deleted

## 2023-05-02 ENCOUNTER — Ambulatory Visit (HOSPITAL_COMMUNITY)
Admission: EM | Admit: 2023-05-02 | Discharge: 2023-05-02 | Disposition: A | Discharge status: Home / Self Care | Payer: BC Managed Care – PPO

## 2023-05-02 DIAGNOSIS — G43911 Migraine, unspecified, intractable, with status migrainosus: Secondary | ICD-10-CM | POA: Diagnosis not present

## 2023-05-02 MED ORDER — METOCLOPRAMIDE HCL 5 MG/ML IJ SOLN
10.0000 mg | Freq: Once | INTRAMUSCULAR | Status: AC
  Administered 2023-05-02: 10 mg via INTRAMUSCULAR

## 2023-05-02 MED ORDER — METOCLOPRAMIDE HCL 5 MG/ML IJ SOLN
INTRAMUSCULAR | Status: AC
  Filled 2023-05-02: qty 2

## 2023-05-02 MED ORDER — DIPHENHYDRAMINE HCL 50 MG/ML IJ SOLN
25.0000 mg | Freq: Once | INTRAMUSCULAR | Status: AC
  Administered 2023-05-02: 25 mg via INTRAMUSCULAR

## 2023-05-02 MED ORDER — DEXAMETHASONE SODIUM PHOSPHATE 10 MG/ML IJ SOLN
INTRAMUSCULAR | Status: AC
  Filled 2023-05-02: qty 1

## 2023-05-02 MED ORDER — DIPHENHYDRAMINE HCL 50 MG/ML IJ SOLN
INTRAMUSCULAR | Status: AC
  Filled 2023-05-02: qty 1

## 2023-05-02 MED ORDER — DEXAMETHASONE SODIUM PHOSPHATE 10 MG/ML IJ SOLN
10.0000 mg | Freq: Once | INTRAMUSCULAR | Status: AC
  Administered 2023-05-02: 10 mg via INTRAMUSCULAR

## 2023-05-02 NOTE — Discharge Instructions (Addendum)
We have given you injections today that will help with your migraine.  Please go straight home, so Benadryl may cause drowsiness.  Please ensure you are drinking at least 64 ounces of water daily and trying to avoid your triggers.  I have attached information for neurologist if you wish to get further evaluation there.  I highly suggest following up with at least your primary care to refill your Topamax.  Please seek immediate care if your migraine persists despite these intervention, you have vision changes, weakness, numbness, vomiting, or any new concerning symptoms.

## 2023-05-02 NOTE — ED Triage Notes (Addendum)
Pt reports running out of migraine medicine; states started with her typical cluster HA onset 2 days ago. States PCP was unable to get her in today. C/O nausea and photosensitivity. Took Excedrin Migraine.

## 2023-05-02 NOTE — ED Provider Notes (Signed)
[facility]    CSN: [id number] Arrival date & time: 05/02/23  1025      History   Chief Complaint Chief Complaint  Patient presents with   Migraine    HPI Sherry Daniels is a 62 y.o. female.   Patient presents to clinic for complaint of a persistent migraine for the past two days. She has had nausea, photosensitivity, nasal congestion and her forehead is sensitive to touch. She has been out of her topamax for 'a while.'  For her migraine she has taken Excedrin migraine for the past 2 days without much relief.  She has been having normal appetite, and is trying to increase her water intake.  She was unable to get into her PCP, so she came to the clinic.  She denies any vision changes or weakness.  Reports her last migraine was about a month prior.     The history is provided by the patient and medical records.  Migraine Associated symptoms include headaches.    Past Medical History:  Diagnosis Date   Allergy    Asthma    Chest pain 11/2009   overnight hospitalization   Constipation    Dr. 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 Loreta Ave; normal   WISDOM TOOTH EXTRACTION      OB History   No obstetric history on file.       Home Medications    Prior to Admission medications   Medication Sig Start Date End Date Taking? Authorizing Provider  amLODipine (NORVASC) 5 MG tablet Take 1.5 tablets (7.5 mg total) by mouth daily. OFFICE VISIT NEEDED FOR ADDITIONAL REFILLS 02/23/23  Yes Ronney Asters, NP  escitalopram (LEXAPRO) 10 MG tablet TAKE 1 TABLET BY MOUTH EVERY DAY 06/18/20  Yes Donita Brooks, MD  hydrochlorothiazide (HYDRODIURIL) 25 MG tablet Take 1 tablet (25 mg total) by mouth daily. 02/23/23 06/23/23 Yes Cleaver, Thomasene Ripple, NP  linaclotide (LINZESS) 72 MCG capsule Take 72 mcg by mouth daily before breakfast.   Yes [provider]  Misc Natural Products (TART CHERRY ADVANCED PO) Take by mouth.   Yes [provider]  Multiple Vitamins-Minerals (MULTIVITAMIN WITH MINERALS) tablet Take 1 tablet by mouth daily.   Yes [provider]  Omega-3 1000 MG CAPS Take by mouth.   Yes [provider]  TURMERIC PO Take by mouth.   Yes [provider]  UNABLE TO FIND Med Name: Estradiol patch   Yes [provider]  acetaminophen (TYLENOL) 500 MG tablet Take 1,500 mg by mouth every 6 (six) hours as needed. Reported on 05/01/2016    [provider]  albuterol (PROVENTIL) (2.5 MG/3ML) 0.083% nebulizer solution Take 3 mLs (2.5 mg total)  by nebulization every 6 (six) hours as needed for wheezing or shortness of breath. 11/16/21   Valentino Nose, NP  albuterol (VENTOLIN HFA) 108 (90 Base) MCG/ACT inhaler USE 2 PUFFS EVERY 4 TO 6 HOURS AS NEEDED 05/24/20   Donita Brooks, MD  aspirin 81 MG EC tablet 1 tablet po qOD Patient taking differently: Take 81 mg by mouth daily. 07/05/12   Tysinger, Kermit Balo, PA-C  estradiol (ESTRACE) 0.1 MG/GM vaginal cream Place 1 g vaginally 3 (three) times a week. 12/27/21   [provider]  estradiol (ESTRACE) 1 MG tablet Take 1 mg by mouth daily.    [provider]  ibuprofen (ADVIL,MOTRIN) 600 MG tablet Take 1 tablet  (600 mg total) by mouth every 6 (six) hours as needed. 10/16/15   Mady Gemma, PA-C  promethazine (PHENERGAN) 12.5 MG tablet Take 1 tablet (12.5 mg total) by mouth every 8 (eight) hours as needed for nausea or vomiting. 09/30/21   Valentino Nose, NP  topiramate (TOPAMAX) 50 MG tablet Take 1 tablet (50 mg total) by mouth 2 (two) times daily. 09/30/21   Valentino Nose, NP    Family History Family History  Problem Relation Age of Onset   Cancer Mother        stage III, colon   Hypertension Mother    Dementia Mother    Heart disease Mother        bradycardia   Stroke Mother    Cancer Father        prostate   Hypertension Father    Hyperlipidemia Father    Heart disease Father 25       MI    Hyperlipidemia Sister    Hypertension Sister    Fibromyalgia Sister    Arthritis Brother        RA   Hypertension Brother    Hyperlipidemia Brother    Fibromyalgia Brother    Hypertension Brother    Hyperlipidemia Brother    Healthy Daughter    Healthy Son    Lupus Niece     Social History Social History   Tobacco Use   Smoking status: Never   Smokeless tobacco: Never  Vaping Use   Vaping Use: Never used  Substance Use Topics   Alcohol use: No    Alcohol/week: 0.0 standard drinks of alcohol   Drug use: No     Allergies   Aspirin, Codeine, Cymbalta [duloxetine hcl], and Simvastatin   Review of Systems Review of Systems  Eyes:  Positive for photophobia.  Neurological:  Positive for headaches. Negative for weakness and numbness.     Physical Exam Triage Vital Signs ED Triage Vitals  Enc Vitals Group     BP 05/02/23 1050 (!) 144/83     Pulse Rate 05/02/23 1050 (!) 53     Resp 05/02/23 1050 20     Temp 05/02/23 1050 97.8 F (36.6 C)     Temp Source 05/02/23 1050 Oral     SpO2 05/02/23 1050 96 %     Weight --      Height --      Head Circumference --      Peak Flow --      Pain Score 05/02/23 1051 10     Pain Loc --      Pain Edu? --       Excl. in GC? --    No data found.  Updated Vital Signs BP (!) 144/83   Pulse Marland Kitchen)  53   Temp 97.8 F (36.6 C) (Oral)   Resp 20   SpO2 96%   Visual Acuity Right Eye Distance:   Left Eye Distance:   Bilateral Distance:    Right Eye Near:   Left Eye Near:    Bilateral Near:     Physical Exam Vitals and nursing note reviewed.  Constitutional:      Appearance: Normal appearance.  HENT:     Head: Normocephalic and atraumatic.     Right Ear: External ear normal.     Left Ear: External ear normal.     Nose: Congestion present.     Mouth/Throat:     Mouth: Mucous membranes are moist.  Eyes:     General: No scleral icterus. Cardiovascular:     Rate and Rhythm: Normal rate and regular rhythm.     Heart sounds: Normal heart sounds.  Pulmonary:     Effort: Pulmonary effort is normal. No respiratory distress.     Breath sounds: Normal breath sounds.  Musculoskeletal:     Cervical back: Normal range of motion.  Skin:    General: Skin is warm and dry.  Neurological:     General: No focal deficit present.     Mental Status: She is alert.  Psychiatric:        Mood and Affect: Mood normal.      UC Treatments / Results  Labs (all labs ordered are listed, but only abnormal results are displayed) Labs Reviewed - No data to display  EKG   Radiology No results found.  Procedures Procedures (including critical care time)  Medications Ordered in UC Medications  diphenhydrAMINE (BENADRYL) injection 25 mg (has no administration in time range)  metoCLOPramide (REGLAN) injection 10 mg (has no administration in time range)  dexamethasone (DECADRON) injection 10 mg (has no administration in time range)    Initial Impression / Assessment and Plan / UC Course  I have reviewed the triage vital signs and the nursing notes.  Pertinent labs & imaging results that were available during my care of the patient were reviewed by me and considered in my medical decision making (see  chart for details).  Vitals in triage reviewed, patient is hemodynamically stable.  Having ongoing migraine for the past 2 days.  Will trial migraine cocktail with Benadryl, Reglan and Decadron in clinic.  Given information for neurology, encourage patient to follow-up either with PCP or neuro for further evaluation and prophylaxis of migraines.  Encouraged to return to clinic or follow-up in the emergency department if migraine persist despite cocktail.  Red flag and return precautions given, no questions at this time.     Final Clinical Impressions(s) / UC Diagnoses   Final diagnoses:  Intractable migraine with status migrainosus, unspecified migraine type     Discharge Instructions      We have given you injections today that will help with your migraine.  Please go straight home, so Benadryl may cause drowsiness.  Please ensure you are drinking at least 64 ounces of water daily and trying to avoid your triggers.  I have attached information for neurologist if you wish to get further evaluation there.  I highly suggest following up with at least your primary care to refill your Topamax.  Please seek immediate care if your migraine persists despite these intervention, you have vision changes, weakness, numbness, vomiting, or any new concerning symptoms.      ED Prescriptions   None    PDMP not reviewed this encounter.  Efrata Brunner, [place], [place] 05/02/23 [phone]

## 2023-05-03 ENCOUNTER — Encounter: Payer: Self-pay | Admitting: Neurology

## 2023-06-01 ENCOUNTER — Other Ambulatory Visit: Payer: Self-pay | Admitting: Obstetrics and Gynecology

## 2023-06-01 ENCOUNTER — Encounter: Payer: Self-pay | Admitting: Obstetrics and Gynecology

## 2023-06-01 DIAGNOSIS — R928 Other abnormal and inconclusive findings on diagnostic imaging of breast: Secondary | ICD-10-CM

## 2023-06-01 DIAGNOSIS — N6310 Unspecified lump in the right breast, unspecified quadrant: Secondary | ICD-10-CM

## 2023-06-02 ENCOUNTER — Other Ambulatory Visit: Payer: Self-pay | Admitting: General Practice

## 2023-06-02 DIAGNOSIS — I10 Essential (primary) hypertension: Secondary | ICD-10-CM

## 2023-06-04 ENCOUNTER — Ambulatory Visit: Payer: BC Managed Care – PPO | Admitting: Family Medicine

## 2023-06-05 ENCOUNTER — Encounter: Payer: Self-pay | Admitting: Family Medicine

## 2023-06-07 ENCOUNTER — Ambulatory Visit
Admission: RE | Admit: 2023-06-07 | Discharge: 2023-06-07 | Disposition: A | Discharge status: Home / Self Care | Payer: BC Managed Care – PPO | Source: Ambulatory Visit | Attending: Obstetrics and Gynecology | Admitting: Obstetrics and Gynecology

## 2023-06-07 ENCOUNTER — Other Ambulatory Visit: Payer: Self-pay | Admitting: Obstetrics and Gynecology

## 2023-06-07 DIAGNOSIS — R2231 Localized swelling, mass and lump, right upper limb: Secondary | ICD-10-CM

## 2023-06-07 DIAGNOSIS — N6310 Unspecified lump in the right breast, unspecified quadrant: Secondary | ICD-10-CM

## 2023-06-28 ENCOUNTER — Telehealth: Payer: Self-pay | Admitting: Urology

## 2023-06-28 NOTE — Telephone Encounter (Signed)
Pt called and lvm wanting to sch. Called pt back but phone just rings. unable to leave vm

## 2023-07-23 ENCOUNTER — Emergency Department (HOSPITAL_BASED_OUTPATIENT_CLINIC_OR_DEPARTMENT_OTHER): Payer: BC Managed Care – PPO | Admitting: Radiology

## 2023-07-23 ENCOUNTER — Emergency Department (HOSPITAL_BASED_OUTPATIENT_CLINIC_OR_DEPARTMENT_OTHER)
Admission: EM | Admit: 2023-07-23 | Discharge: 2023-07-23 | Disposition: A | Discharge status: Home / Self Care | Payer: BC Managed Care – PPO

## 2023-07-23 ENCOUNTER — Encounter (HOSPITAL_BASED_OUTPATIENT_CLINIC_OR_DEPARTMENT_OTHER): Payer: Self-pay

## 2023-07-23 ENCOUNTER — Other Ambulatory Visit: Payer: Self-pay

## 2023-07-23 ENCOUNTER — Emergency Department (HOSPITAL_BASED_OUTPATIENT_CLINIC_OR_DEPARTMENT_OTHER): Payer: BC Managed Care – PPO

## 2023-07-23 DIAGNOSIS — J45909 Unspecified asthma, uncomplicated: Secondary | ICD-10-CM | POA: Diagnosis not present

## 2023-07-23 DIAGNOSIS — Z7982 Long term (current) use of aspirin: Secondary | ICD-10-CM | POA: Diagnosis not present

## 2023-07-23 DIAGNOSIS — R200 Anesthesia of skin: Secondary | ICD-10-CM | POA: Insufficient documentation

## 2023-07-23 DIAGNOSIS — E876 Hypokalemia: Secondary | ICD-10-CM | POA: Diagnosis not present

## 2023-07-23 DIAGNOSIS — R519 Headache, unspecified: Secondary | ICD-10-CM | POA: Diagnosis present

## 2023-07-23 DIAGNOSIS — R202 Paresthesia of skin: Secondary | ICD-10-CM | POA: Diagnosis not present

## 2023-07-23 DIAGNOSIS — I10 Essential (primary) hypertension: Secondary | ICD-10-CM | POA: Diagnosis not present

## 2023-07-23 DIAGNOSIS — Z79899 Other long term (current) drug therapy: Secondary | ICD-10-CM | POA: Diagnosis not present

## 2023-07-23 DIAGNOSIS — Z7951 Long term (current) use of inhaled steroids: Secondary | ICD-10-CM | POA: Insufficient documentation

## 2023-07-23 LAB — CBC
HCT: 40.1 % (ref 36.0–46.0)
Hemoglobin: 13.3 g/dL (ref 12.0–15.0)
MCH: 29.7 pg (ref 26.0–34.0)
MCHC: 33.2 g/dL (ref 30.0–36.0)
MCV: 89.5 fL (ref 80.0–100.0)
Platelets: 243 10*3/uL (ref 150–400)
RBC: 4.48 MIL/uL (ref 3.87–5.11)
RDW: 13.4 % (ref 11.5–15.5)
WBC: 4.5 10*3/uL (ref 4.0–10.5)
nRBC: 0 % (ref 0.0–0.2)

## 2023-07-23 LAB — URINALYSIS, ROUTINE W REFLEX MICROSCOPIC
Bilirubin Urine: NEGATIVE
Glucose, UA: NEGATIVE mg/dL
Hgb urine dipstick: NEGATIVE
Ketones, ur: NEGATIVE mg/dL
Leukocytes,Ua: NEGATIVE
Nitrite: NEGATIVE
Protein, ur: NEGATIVE mg/dL
Specific Gravity, Urine: 1.011 (ref 1.005–1.030)
pH: 6 (ref 5.0–8.0)

## 2023-07-23 LAB — BASIC METABOLIC PANEL
Anion gap: 10 (ref 5–15)
BUN: 15 mg/dL (ref 6–20)
CO2: 29 mmol/L (ref 22–32)
Calcium: 9.6 mg/dL (ref 8.9–10.3)
Chloride: 101 mmol/L (ref 98–111)
Creatinine, Ser: 0.79 mg/dL (ref 0.44–1.00)
GFR, Estimated: >60 mL/min (ref >60)
Glucose, Bld: 95 mg/dL (ref 70–99)
Potassium: 2.9 mmol/L — ABNORMAL LOW (ref 3.5–5.1)
Sodium: 140 mmol/L (ref 135–145)

## 2023-07-23 LAB — TROPONIN I (HIGH SENSITIVITY): Troponin I (High Sensitivity): 3 ng/L (ref <18)

## 2023-07-23 LAB — CBG MONITORING, ED: Glucose-Capillary: 99 mg/dL (ref 70–99)

## 2023-07-23 MED ORDER — METOCLOPRAMIDE HCL 5 MG/ML IJ SOLN
10.0000 mg | Freq: Once | INTRAMUSCULAR | Status: AC
  Administered 2023-07-23: 10 mg via INTRAVENOUS
  Filled 2023-07-23: qty 2

## 2023-07-23 MED ORDER — SODIUM CHLORIDE 0.9 % IV BOLUS
1000.0000 mL | Freq: Once | INTRAVENOUS | Status: AC
  Administered 2023-07-23: 1000 mL via INTRAVENOUS

## 2023-07-23 MED ORDER — POTASSIUM CHLORIDE CRYS ER 20 MEQ PO TBCR
20.0000 meq | EXTENDED_RELEASE_TABLET | Freq: Two times a day (BID) | ORAL | 0 refills | Status: DC
Start: 2023-07-23 — End: 2024-01-08

## 2023-07-23 MED ORDER — DIPHENHYDRAMINE HCL 50 MG/ML IJ SOLN
12.5000 mg | Freq: Once | INTRAMUSCULAR | Status: AC
  Administered 2023-07-23: 12.5 mg via INTRAVENOUS
  Filled 2023-07-23: qty 1

## 2023-07-23 MED ORDER — POTASSIUM CHLORIDE CRYS ER 20 MEQ PO TBCR
40.0000 meq | EXTENDED_RELEASE_TABLET | Freq: Once | ORAL | Status: AC
  Administered 2023-07-23: 40 meq via ORAL
  Filled 2023-07-23: qty 2

## 2023-07-23 MED ORDER — KETOROLAC TROMETHAMINE 15 MG/ML IJ SOLN
15.0000 mg | Freq: Once | INTRAMUSCULAR | Status: AC
  Administered 2023-07-23: 15 mg via INTRAVENOUS
  Filled 2023-07-23: qty 1

## 2023-07-23 NOTE — Discharge Instructions (Signed)
You have been seen today for your complaint of headache, numbness. Your lab work showed low potassium but was otherwise reassuring. Your imaging was reassuring and showed no abnormalities. Your discharge medications include Alternate tylenol and ibuprofen for pain. You may alternate these every 4 hours. You may take up to 800 mg of ibuprofen at a time and up to 1000 mg of tylenol. Follow up with: your neurologist as scheduled Please seek immediate medical care if you develop any of the following symptoms: Your headache: Gets very bad quickly. Gets worse after a lot of physical activity. You have any of these symptoms: You continue to vomit. A stiff neck. Trouble seeing. Your eye or ear hurts. Trouble speaking. Weak muscles or you lose muscle control. You lose your balance or have trouble walking. You feel like you will pass out (faint) or you pass out. You are mixed up (confused). You have a seizure. At this time there does not appear to be the presence of an emergent medical condition, however there is always the potential for conditions to change. Please read and follow the below instructions.  Do not take your medicine if  develop an itchy rash, swelling in your mouth or lips, or difficulty breathing; call 911 and seek immediate emergency medical attention if this occurs.  You may review your lab tests and imaging results in their entirety on your MyChart account.  Please discuss all results of fully with your primary care provider and other specialist at your follow-up visit.  Note: Portions of this text may have been transcribed using voice recognition software. Every effort was made to ensure accuracy; however, inadvertent computerized transcription errors may still be present.

## 2023-07-23 NOTE — ED Provider Notes (Signed)
[place] EMERGENCY DEPARTMENT AT [facility] Provider Note   CSN: [id number] Arrival date & time: 07/23/23  2030     History  Chief Complaint  Patient presents with   Numbness    Right Facial Numbness    Sherry Daniels is a 62 y.o. female.  With a history of recurrent migraines, hypertension, hyperlipidemia, GERD, fibromyalgia, asthma who presents to the ED for evaluation of a headache and right-sided paresthesias.  She states she developed a typical migraine on Friday evening.  He symptoms began to resolve this morning, however she developed a throbbing sensation to bilateral temples and shortly afterwards developed a pins and needle sensation to the right side of her body.  She denies any weakness, facial droop, slurred speech, vision changes.  She states she has been dealing with migraines for quite some time and has an appointment to follow-up with neurology later this month.  She checked her blood pressure at home prior to arrival and found it to be elevated so she took her home dose of amlodipine.  Blood pressure is typically 140s over 100.  Currently states her symptoms have mostly resolved.  Denies currently having any numbness or paresthesias.  Only has a mild tight sensation to bilateral temples.  HPI     Home Medications Prior to Admission medications   Medication Sig Start Date End Date Taking? Authorizing Provider  potassium chloride SA (KLOR-CON M) 20 MEQ tablet Take 1 tablet (20 mEq total) by mouth 2 (two) times daily for 3 days. 07/23/23 07/26/23 Yes Kashus Karlen, Edsel Petrin, PA-C  acetaminophen (TYLENOL) 500 MG tablet Take 1,500 mg by mouth every 6 (six) hours as needed. Reported on 05/01/2016    [provider]  albuterol (PROVENTIL) (2.5 MG/3ML) 0.083% nebulizer solution Take 3 mLs (2.5 mg total) by nebulization every 6 (six) hours as needed for wheezing or shortness of breath. 11/16/21   Valentino Nose, NP  albuterol (VENTOLIN HFA) 108 (90 Base)  MCG/ACT inhaler USE 2 PUFFS EVERY 4 TO 6 HOURS AS NEEDED 05/24/20   Donita Brooks, MD  amLODipine (NORVASC) 5 MG tablet TAKE 1.5 TABLETS (7.5 MG TOTAL) BY MOUTH DAILY. OFFICE VISIT NEEDED FOR ADDITIONAL REFILLS 06/04/23   Ronney Asters, NP  aspirin 81 MG EC tablet 1 tablet po qOD Patient taking differently: Take 81 mg by mouth daily. 07/05/12   Tysinger, Kermit Balo, PA-C  escitalopram (LEXAPRO) 10 MG tablet TAKE 1 TABLET BY MOUTH EVERY DAY 06/18/20   Donita Brooks, MD  estradiol (ESTRACE) 0.1 MG/GM vaginal cream Place 1 g vaginally 3 (three) times a week. 12/27/21   [provider]  estradiol (ESTRACE) 1 MG tablet Take 1 mg by mouth daily.    [provider]  hydrochlorothiazide (HYDRODIURIL) 25 MG tablet Take 1 tablet (25 mg total) by mouth daily. 02/23/23 06/23/23  Ronney Asters, NP  ibuprofen (ADVIL,MOTRIN) 600 MG tablet Take 1 tablet (600 mg total) by mouth every 6 (six) hours as needed. 10/16/15   Mady Gemma, PA-C  linaclotide (LINZESS) 72 MCG capsule Take 72 mcg by mouth daily before breakfast.    [provider]  Misc Natural Products (TART CHERRY ADVANCED PO) Take by mouth.    [provider]  Multiple Vitamins-Minerals (MULTIVITAMIN WITH MINERALS) tablet Take 1 tablet by mouth daily.    [provider]  Omega-3 1000 MG CAPS Take by mouth.    [provider]  promethazine (PHENERGAN) 12.5 MG tablet Take 1 tablet (12.5  mg total) by mouth every 8 (eight) hours as needed for nausea or vomiting. 09/30/21   Valentino Nose, NP  topiramate (TOPAMAX) 50 MG tablet Take 1 tablet (50 mg total) by mouth 2 (two) times daily. 09/30/21   Valentino Nose, NP  TURMERIC PO Take by mouth.    [provider]  UNABLE TO FIND Med Name: Estradiol patch    [provider]      Allergies    Aspirin, Codeine, Cymbalta [duloxetine hcl], and Simvastatin    Review of Systems   Review of Systems  Neurological:  Positive  for numbness and headaches.  All other systems reviewed and are negative.   Physical Exam Updated Vital Signs BP 122/62   Pulse (!) 52   Temp 97.8 F (36.6 C)   Resp 18   Ht 5\' 6"  (1.676 m)   Wt 75.8 kg   SpO2 100%   BMI 26.95 kg/m  Physical Exam Vitals and nursing note reviewed.  Constitutional:      General: She is not in acute distress.    Appearance: She is well-developed.  HENT:     Head: Normocephalic and atraumatic.  Eyes:     Conjunctiva/sclera: Conjunctivae normal.     Pupils: Pupils are equal, round, and reactive to light.  Cardiovascular:     Rate and Rhythm: Normal rate and regular rhythm.     Heart sounds: No murmur heard. Pulmonary:     Effort: Pulmonary effort is normal. No respiratory distress.     Breath sounds: Normal breath sounds.  Abdominal:     Palpations: Abdomen is soft.     Tenderness: There is no abdominal tenderness.  Musculoskeletal:        General: No swelling.     Cervical back: Neck supple.  Skin:    General: Skin is warm and dry.     Capillary Refill: Capillary refill takes less than 2 seconds.  Neurological:     General: No focal deficit present.     Mental Status: She is alert and oriented to person, place, and time.     Comments:   MENTAL STATUS: AAOx3   LANG/SPEECH: Fluent, intact naming, repetition & comprehension   CRANIAL NERVES:   II: Pupils equal and reactive   III, IV, VI: EOM intact, no gaze preference or deviation, no nystagmus   V: normal sensation of the face   VII: no facial asymmetry   VIII: normal hearing to speech   MOTOR: 5/5 in both upper and lower extremities   SENSORY: Normal to touch in all extremiteis   COORD: Normal finger to nose, heel to shin and shoulder shrug, no tremor, no dysmetria. No pronator drift   Psychiatric:        Mood and Affect: Mood normal.        Behavior: Behavior normal.     ED Results / Procedures / Treatments   Labs (all labs ordered are listed, but only abnormal results are  displayed) Labs Reviewed  BASIC METABOLIC PANEL - Abnormal; Notable for the following components:      Result Value   Potassium 2.9 (*)    All other components within normal limits  URINALYSIS, ROUTINE W REFLEX MICROSCOPIC - Abnormal; Notable for the following components:   Color, Urine COLORLESS (*)    All other components within normal limits  CBC  CBG MONITORING, ED  TROPONIN I (HIGH SENSITIVITY)  TROPONIN I (HIGH SENSITIVITY)    EKG None  Radiology CT Head  Wo Contrast  Result Date: 07/23/2023 CLINICAL DATA:  Headache history of migraines right numbness EXAM: CT HEAD WITHOUT CONTRAST TECHNIQUE: Contiguous axial images were obtained from the base of the skull through the vertex without intravenous contrast. RADIATION DOSE REDUCTION: This exam was performed according to the departmental dose-optimization program which includes automated exposure control, adjustment of the mA and/or kV according to patient size and/or use of iterative reconstruction technique. COMPARISON:  CT brain 07/30/2004 FINDINGS: Brain: No evidence of acute infarction, hemorrhage, hydrocephalus, extra-axial collection or mass lesion/mass effect. Vascular: No hyperdense vessel or unexpected calcification. Skull: Normal. Negative for fracture or focal lesion. Sinuses/Orbits: No acute finding. Other: None IMPRESSION: Negative non contrasted CT appearance of the brain. Electronically Signed   By: Jasmine Pang M.D.   On: 07/23/2023 22:59   DG Chest 2 View  Result Date: 07/23/2023 CLINICAL DATA:  Chest pain EXAM: CHEST - 2 VIEW COMPARISON:  02/11/2023 FINDINGS: Normal heart size and mediastinal contours. No acute infiltrate or edema. No effusion or pneumothorax. No acute osseous findings. IMPRESSION: No active cardiopulmonary disease. Electronically Signed   By: Tiburcio Pea M.D.   On: 07/23/2023 22:51    Procedures Procedures    Medications Ordered in ED Medications  ketorolac (TORADOL) 15 MG/ML injection 15 mg  (15 mg Intravenous Given 07/23/23 2214)  metoCLOPramide (REGLAN) injection 10 mg (10 mg Intravenous Given 07/23/23 2213)  diphenhydrAMINE (BENADRYL) injection 12.5 mg (12.5 mg Intravenous Given 07/23/23 2214)  sodium chloride 0.9 % bolus 1,000 mL (0 mLs Intravenous Stopped 07/23/23 2327)  potassium chloride SA (KLOR-CON M) CR tablet 40 mEq (40 mEq Oral Given 07/23/23 2331)    ED Course/ Medical Decision Making/ A&P                                 Medical Decision Making Amount and/or Complexity of Data Reviewed Labs: ordered. Radiology: ordered.  Risk Prescription drug management.   This patient presents to the ED for concern of headache, paresthesias, this involves an extensive number of treatment options, and is a complaint that carries with it a high risk of complications and morbidity.  Emergent considerations for headache include subarachnoid hemorrhage, meningitis, temporal arteritis, glaucoma, cerebral ischemia, carotid/vertebral dissection, intracranial tumor, Venous sinus thrombosis, carbon monoxide poisoning, acute or chronic subdural hemorrhage.  Other considerations include: Migraine, Cluster headache, Hypertension, Caffeine, alcohol, or drug withdrawal, Pseudotumor cerebri, Arteriovenous malformation, Head injury, Neurocysticercosis, Post-lumbar puncture, Preeclampsia, Tension headache, Sinusitis, Cervical arthritis, Refractive error causing strain, Dental abscess, Otitis media, Temporomandibular joint syndrome, Depression, Somatoform disorder (eg, somatization) Trigeminal neuralgia, Glossopharyngeal neuralgia.   My initial workup includes labs, imaging, symptom control, EKG  Additional history obtained from: Nursing notes from this visit.  I ordered, reviewed and interpreted labs which include: CBC, BMP, troponin, urinalysis.  Hypokalemia of 2.9, replenishment began in the ED  I ordered imaging studies including chest x-ray, CT head I independently visualized and interpreted imaging  which showed normal chest x-ray and CT head I agree with the radiologist interpretation  Cardiac Monitoring:  The patient was maintained on a cardiac monitor.  I personally viewed and interpreted the cardiac monitored which showed an underlying rhythm of: NSR  Afebrile, initially hypertensive but otherwise hemodynamically stable.  62 year old female presenting to the ED for evaluation of a headache and right-sided paresthesias.  Reports she developed a migraine 3 days ago.  When the migraine improved, paresthesias began on the right side of her body.  On my initial examination, she states her symptoms had mostly resolved.  She was complaining of some mild tenderness to bilateral temples but was otherwise asymptomatic.  She believes her symptoms were secondary to her migraine.  She has a nonfocal neurologic exam.  Lab workup was reassuring.  Imaging without abnormalities.  Overall suspect an atypical migraine.  She has an appointment with neurology coming up for reevaluation of her chronic migraines.  She was encouraged to keep this appointment.  She was given return precautions.  Stable at discharge.  At this time there does not appear to be any evidence of an acute emergency medical condition and the patient appears stable for discharge with appropriate outpatient follow up. Diagnosis was discussed with patient who verbalizes understanding of care plan and is agreeable to discharge. I have discussed return precautions with patient who verbalizes understanding. Patient encouraged to follow-up with their PCP within 1 week. All questions answered.  Patient's case discussed with Dr. Maple Hudson who agrees with plan to discharge with follow-up.   Note: Portions of this report may have been transcribed using voice recognition software. Every effort was made to ensure accuracy; however, inadvertent computerized transcription errors may still be present.        Final Clinical Impression(s) / ED  Diagnoses Final diagnoses:  Bad headache  Paresthesias    Rx / DC Orders ED Discharge Orders          Ordered    potassium chloride SA (KLOR-CON M) 20 MEQ tablet  2 times daily        07/23/23 2335              Michelle Piper, PA-C 07/23/23 2337    Coral Spikes, DO 07/23/23 2346

## 2023-07-23 NOTE — ED Triage Notes (Signed)
Patient arrives to ED POV C/O Migraines and RT Face and RT side of body numbness. Pt states that she Migraine started this morning and has not resolved with medication. Pt states she has Hx of Migraines and HTN but the numbness is new. Pt A/O x4 and Ambulatory. No other complaints at this time.

## 2023-07-30 ENCOUNTER — Encounter: Payer: Self-pay | Admitting: Gastroenterology

## 2023-08-10 LAB — HM MAMMOGRAPHY

## 2023-08-14 ENCOUNTER — Other Ambulatory Visit: Payer: Self-pay | Admitting: Obstetrics & Gynecology

## 2023-08-14 DIAGNOSIS — R928 Other abnormal and inconclusive findings on diagnostic imaging of breast: Secondary | ICD-10-CM

## 2023-08-16 ENCOUNTER — Other Ambulatory Visit: Payer: Self-pay | Admitting: General Practice

## 2023-08-16 NOTE — Progress Notes (Deleted)
NEUROLOGY CONSULTATION NOTE  Sherry Daniels MRN: [id number] DOB: [birth year]  Referring provider: [place] Garrison, FNP Primary care provider: Sande Rives, MD  Reason for consult:  migraines  Assessment/Plan:   ***   Subjective:  Sherry Daniels is a 62 year old ***-handed female with HTN, HLD, non-alcoholic fatty liver disease, fibromyalgia, asthma and GERD who presents for migraines.  History supplemented by primary care, ED and UC notes.  History of migraines since ***.  She had been out of her topiramate ***.  She was seen in the ED on 07/23/2023 because the day after one of her habitual migraines, she developed a bitemporal throbbing headache followed by pins and needles sensation involving her right ***.  No associated facial droop, unilateral weakness or speech disturbance.  She had a CT head, personally reviewed, which was negative.  ***  She had prior episode of right sided facial and upper extremity numbness in 2005, for which she had an MRI of brain with and without contrast on 10/16/2004, which was personally reviewed and revealed nonspecific scattered T2/FLAIR small punctate foci within the subcortical white matter but otherwise unremarkable.    Past NSAIDS/analgesics:  *** Past abortive triptans:  *** Past abortive ergotamine:  *** Past muscle relaxants:  *** Past anti-emetic:  *** Past antihypertensive medications:  *** Past antidepressant medications:  *** Past anticonvulsant medications:  *** Past anti-CGRP:  *** Past vitamins/Herbal/Supplements:  *** Past antihistamines/decongestants:  *** Other past therapies:  ***  Current NSAIDS/analgesics:  *** Current triptans:  *** Current ergotamine:  *** Current anti-emetic:  *** Current muscle relaxants:  *** Current Antihypertensive medications:  *** Current Antidepressant medications:  *** Current Anticonvulsant medications:  *** Current anti-CGRP:  *** Current Vitamins/Herbal/Supplements:   *** Current Antihistamines/Decongestants:  *** Other therapy:  *** Birth control:  *** Other medications:  ***   Caffeine:  *** Alcohol:  *** Smoker:  *** Diet:  *** Exercise:  *** Depression:  ***; Anxiety:  *** Other pain:  *** Sleep hygiene:  *** Family history of headache:  ***      PAST MEDICAL HISTORY: Past Medical History:  Diagnosis Date   Allergy    Asthma    Chest pain 11/2009   overnight hospitalization   Constipation    Dr. Loreta Ave   Fibromyalgia    GERD (gastroesophageal reflux disease)    Hyperlipidemia    Hypertension    Migraine    Nonalcoholic fatty liver disease     PAST SURGICAL HISTORY: Past Surgical History:  Procedure Laterality Date   ABDOMINAL HYSTERECTOMY     total   COLONOSCOPY  09/15/2009   Dr. Loreta Ave; normal   WISDOM TOOTH EXTRACTION      MEDICATIONS: Current Outpatient Medications on File Prior to Visit  Medication Sig Dispense Refill   acetaminophen (TYLENOL) 500 MG tablet Take 1,500 mg by mouth every 6 (six) hours as needed. Reported on 05/01/2016     albuterol (PROVENTIL) (2.5 MG/3ML) 0.083% nebulizer solution Take 3 mLs (2.5 mg total) by nebulization every 6 (six) hours as needed for wheezing or shortness of breath. 150 mL 1   albuterol (VENTOLIN HFA) 108 (90 Base) MCG/ACT inhaler USE 2 PUFFS EVERY 4 TO 6 HOURS AS NEEDED 18 g 2   amLODipine (NORVASC) 5 MG tablet TAKE 1.5 TABLETS (7.5 MG TOTAL) BY MOUTH DAILY. OFFICE VISIT NEEDED FOR ADDITIONAL REFILLS 135 tablet 1   aspirin 81 MG EC tablet 1 tablet po qOD (Patient taking differently: Take 81 mg by mouth  daily.) 30 tablet 12   escitalopram (LEXAPRO) 10 MG tablet TAKE 1 TABLET BY MOUTH EVERY DAY 90 tablet 2   estradiol (ESTRACE) 0.1 MG/GM vaginal cream Place 1 g vaginally 3 (three) times a week.     estradiol (ESTRACE) 1 MG tablet Take 1 mg by mouth daily.     hydrochlorothiazide (HYDRODIURIL) 25 MG tablet Take 1 tablet (25 mg total) by mouth daily. 30 tablet 3   ibuprofen  (ADVIL,MOTRIN) 600 MG tablet Take 1 tablet (600 mg total) by mouth every 6 (six) hours as needed. 30 tablet 0   linaclotide (LINZESS) 72 MCG capsule Take 72 mcg by mouth daily before breakfast.     Misc Natural Products (TART CHERRY ADVANCED PO) Take by mouth.     Multiple Vitamins-Minerals (MULTIVITAMIN WITH MINERALS) tablet Take 1 tablet by mouth daily.     Omega-3 1000 MG CAPS Take by mouth.     potassium chloride SA (KLOR-CON M) 20 MEQ tablet Take 1 tablet (20 mEq total) by mouth 2 (two) times daily for 3 days. 6 tablet 0   promethazine (PHENERGAN) 12.5 MG tablet Take 1 tablet (12.5 mg total) by mouth every 8 (eight) hours as needed for nausea or vomiting. 20 tablet 0   topiramate (TOPAMAX) 50 MG tablet Take 1 tablet (50 mg total) by mouth 2 (two) times daily. 180 tablet 2   TURMERIC PO Take by mouth.     UNABLE TO FIND Med Name: Estradiol patch     No current facility-administered medications on file prior to visit.    ALLERGIES: Allergies  Allergen Reactions   Aspirin Nausea Only   Codeine Nausea Only   Cymbalta [Duloxetine Hcl]     Felt like it worsened anxiety and fibromyalgia   Simvastatin Other (See Comments)    dizziness    FAMILY HISTORY: Family History  Problem Relation Age of Onset   Cancer Mother        stage III, colon   Hypertension Mother    Dementia Mother    Heart disease Mother        bradycardia   Stroke Mother    Cancer Father        prostate   Hypertension Father    Hyperlipidemia Father    Heart disease Father 29       MI    Hyperlipidemia Sister    Hypertension Sister    Fibromyalgia Sister    Arthritis Brother        RA   Hypertension Brother    Hyperlipidemia Brother    Fibromyalgia Brother    Hypertension Brother    Hyperlipidemia Brother    Healthy Daughter    Healthy Son    Lupus Niece     Objective:  *** General: No acute distress.  Patient appears well-groomed.   Head:  Normocephalic/atraumatic Eyes:  fundi examined but not  visualized Neck: supple, no paraspinal tenderness, full range of motion Back: No paraspinal tenderness Heart: regular rate and rhythm Lungs: Clear to auscultation bilaterally. Vascular: No carotid bruits. Neurological Exam: Mental status: alert and oriented to person, place, and time, speech fluent and not dysarthric, language intact. Cranial nerves: CN I: not tested CN II: pupils equal, round and reactive to light, visual fields intact CN III, IV, VI:  full range of motion, no nystagmus, no ptosis CN V: facial sensation intact. CN VII: upper and lower face symmetric CN VIII: hearing intact CN IX, X: gag intact, uvula midline CN XI: sternocleidomastoid and trapezius  muscles intact CN XII: tongue midline Bulk & Tone: normal, no fasciculations. Motor:  muscle strength 5/5 throughout Sensation:  Pinprick, temperature and vibratory sensation intact. Deep Tendon Reflexes:  2+ throughout,  toes downgoing.   Finger to nose testing:  Without dysmetria.   Heel to shin:  Without dysmetria.   Gait:  Normal station and stride.  Romberg negative.    Thank you for allowing me to take part in the care of this patient.  Shon Millet, DO  CC: ***

## 2023-08-17 ENCOUNTER — Ambulatory Visit: Payer: BC Managed Care – PPO | Admitting: Neurology

## 2023-08-17 ENCOUNTER — Encounter: Payer: Self-pay | Admitting: Neurology

## 2023-08-17 DIAGNOSIS — Z029 Encounter for administrative examinations, unspecified: Secondary | ICD-10-CM

## 2023-08-23 ENCOUNTER — Ambulatory Visit
Admission: RE | Admit: 2023-08-23 | Discharge: 2023-08-23 | Disposition: A | Discharge status: Home / Self Care | Payer: BC Managed Care – PPO | Source: Ambulatory Visit | Attending: Obstetrics & Gynecology | Admitting: Obstetrics & Gynecology

## 2023-08-23 DIAGNOSIS — R928 Other abnormal and inconclusive findings on diagnostic imaging of breast: Secondary | ICD-10-CM

## 2023-08-23 LAB — HM MAMMOGRAPHY

## 2023-09-19 NOTE — Progress Notes (Deleted)
NEUROLOGY CONSULTATION NOTE  Sherry Daniels MRN: [id number] DOB: 1961-11-06  Referring provider: Sande Rives, MD Primary care provider: Ronnald Ramp O'Neal  Reason for consult:  migraines  Assessment/Plan:   ***   Subjective:  Sherry Daniels is a 62 year old female with HTN, HLD, asthma, non-alcoholic fatty liver disease and fibromyalgia who presents for migraines.  History supplemented by ED and referring provider's notes.  ***.    Seen in the ED on 07/23/2023 for migraine with right sided numbness.  CT head personally reviewed was negative.  Past NSAIDS/analgesics:  Fioricet, Toradol Past abortive triptans:  eletriptan Past abortive ergotamine:  none Past muscle relaxants:  Flexeril, Robaxin Past anti-emetic:  *** Past antihypertensive medications:  propranolol, metoprolol, lisinopril, Lasix Past antidepressant medications:  amitriptyline, nortriptyline, duloxetine Past anticonvulsant medications:  *** Past anti-CGRP:  none  Current NSAIDS/analgesics:  acetaminophen Current triptans:  none Current ergotamine:  none Current anti-emetic:  promethazine 12.5mg  Current muscle relaxants:  none Current Antihypertensive medications:  amlodipine, HCTZ Current Antidepressant medications:  escitalopram 10mg  daily Current Anticonvulsant medications:  topiramate 50mg  twice daily Current anti-CGRP:  none   Caffeine:  *** Alcohol:  *** Smoker:  *** Diet:  *** Exercise:  *** Depression:  ***; Anxiety:  *** Other pain:  *** Sleep hygiene:  *** Family history of headache:  ***      PAST MEDICAL HISTORY: Past Medical History:  Diagnosis Date   Allergy    Asthma    Chest pain 11/2009   overnight hospitalization   Constipation    Dr. Loreta Ave   Fibromyalgia    GERD (gastroesophageal reflux disease)    Hyperlipidemia    Hypertension    Migraine    Nonalcoholic fatty liver disease     PAST SURGICAL HISTORY: Past Surgical History:  Procedure  Laterality Date   ABDOMINAL HYSTERECTOMY     total   COLONOSCOPY  09/15/2009   Dr. Loreta Ave; normal   WISDOM TOOTH EXTRACTION      MEDICATIONS: Current Outpatient Medications on File Prior to Visit  Medication Sig Dispense Refill   acetaminophen (TYLENOL) 500 MG tablet Take 1,500 mg by mouth every 6 (six) hours as needed. Reported on 05/01/2016     albuterol (PROVENTIL) (2.5 MG/3ML) 0.083% nebulizer solution Take 3 mLs (2.5 mg total) by nebulization every 6 (six) hours as needed for wheezing or shortness of breath. 150 mL 1   albuterol (VENTOLIN HFA) 108 (90 Base) MCG/ACT inhaler USE 2 PUFFS EVERY 4 TO 6 HOURS AS NEEDED 18 g 2   amLODipine (NORVASC) 5 MG tablet TAKE 1.5 TABLETS (7.5 MG TOTAL) BY MOUTH DAILY. OFFICE VISIT NEEDED FOR ADDITIONAL REFILLS 135 tablet 1   aspirin 81 MG EC tablet 1 tablet po qOD (Patient taking differently: Take 81 mg by mouth daily.) 30 tablet 12   escitalopram (LEXAPRO) 10 MG tablet TAKE 1 TABLET BY MOUTH EVERY DAY 90 tablet 2   estradiol (ESTRACE) 0.1 MG/GM vaginal cream Place 1 g vaginally 3 (three) times a week.     estradiol (ESTRACE) 1 MG tablet Take 1 mg by mouth daily.     hydrochlorothiazide (HYDRODIURIL) 25 MG tablet TAKE 1 TABLET (25 MG TOTAL) BY MOUTH DAILY. 30 tablet 3   ibuprofen (ADVIL,MOTRIN) 600 MG tablet Take 1 tablet (600 mg total) by mouth every 6 (six) hours as needed. 30 tablet 0   linaclotide (LINZESS) 72 MCG capsule Take 72 mcg by mouth daily before breakfast.     Misc Natural Products (TART  CHERRY ADVANCED PO) Take by mouth.     Multiple Vitamins-Minerals (MULTIVITAMIN WITH MINERALS) tablet Take 1 tablet by mouth daily.     Omega-3 1000 MG CAPS Take by mouth.     potassium chloride SA (KLOR-CON M) 20 MEQ tablet Take 1 tablet (20 mEq total) by mouth 2 (two) times daily for 3 days. 6 tablet 0   promethazine (PHENERGAN) 12.5 MG tablet Take 1 tablet (12.5 mg total) by mouth every 8 (eight) hours as needed for nausea or vomiting. 20 tablet 0    topiramate (TOPAMAX) 50 MG tablet Take 1 tablet (50 mg total) by mouth 2 (two) times daily. 180 tablet 2   TURMERIC PO Take by mouth.     UNABLE TO FIND Med Name: Estradiol patch     No current facility-administered medications on file prior to visit.    ALLERGIES: Allergies  Allergen Reactions   Aspirin Nausea Only   Codeine Nausea Only   Cymbalta [Duloxetine Hcl]     Felt like it worsened anxiety and fibromyalgia   Simvastatin Other (See Comments)    dizziness    FAMILY HISTORY: Family History  Problem Relation Age of Onset   Cancer Mother        stage III, colon   Hypertension Mother    Dementia Mother    Heart disease Mother        bradycardia   Stroke Mother    Cancer Father        prostate   Hypertension Father    Hyperlipidemia Father    Heart disease Father 11       MI    Hyperlipidemia Sister    Hypertension Sister    Fibromyalgia Sister    Arthritis Brother        RA   Hypertension Brother    Hyperlipidemia Brother    Fibromyalgia Brother    Hypertension Brother    Hyperlipidemia Brother    Healthy Daughter    Healthy Son    Lupus Niece     Objective:  *** General: No acute distress.  Patient appears well-groomed.   Head:  Normocephalic/atraumatic Eyes:  fundi examined but not visualized Neck: supple, no paraspinal tenderness, full range of motion Back: No paraspinal tenderness Heart: regular rate and rhythm Lungs: Clear to auscultation bilaterally. Vascular: No carotid bruits. Neurological Exam: Mental status: alert and oriented to person, place, and time, speech fluent and not dysarthric, language intact. Cranial nerves: CN I: not tested CN II: pupils equal, round and reactive to light, visual fields intact CN III, IV, VI:  full range of motion, no nystagmus, no ptosis CN V: facial sensation intact. CN VII: upper and lower face symmetric CN VIII: hearing intact CN IX, X: gag intact, uvula midline CN XI: sternocleidomastoid and trapezius  muscles intact CN XII: tongue midline Bulk & Tone: normal, no fasciculations. Motor:  muscle strength 5/5 throughout Sensation:  Pinprick, temperature and vibratory sensation intact. Deep Tendon Reflexes:  2+ throughout,  toes downgoing.   Finger to nose testing:  Without dysmetria.   Heel to shin:  Without dysmetria.   Gait:  Normal station and stride.  Romberg negative.    Thank you for allowing me to take part in the care of this patient.  Shon Millet, DO  CC: ***

## 2023-09-20 ENCOUNTER — Ambulatory Visit: Payer: BC Managed Care – PPO | Admitting: Neurology

## 2023-10-17 ENCOUNTER — Ambulatory Visit: Payer: BC Managed Care – PPO | Admitting: Gastroenterology

## 2023-10-17 NOTE — Progress Notes (Deleted)
Chief Complaint: IBS, acid reflux  HPI: Patient is a 62 year old female patient that presents as a new patient with medical history of recurrent migraines, hypertension, hyperlipidemia, GERD, fibromyalgia, and asthma who is a patient of  Sherry Daniels, Sherry Daniels, * who presents with main complaint of IBS, acid reflux.    Interval history: Patient reports her bowel habits History of constipation Patient denies/admits to taking Linzess Patient denies/admits to rectal bleeding  Patient denies/admits to abd pain, described as Patient denies/admits nausea, vomiting, and weight loss Patient denies/admits GERD and dysphagia  Patient denies/admits taking antiacid medication   Family hx of CA Mother colon cancer Father prostate CA Family hx of GI issues Colon/EGD NSAIDs Alcohol Smoking    Past Medical History:  Diagnosis Date   Allergy    Asthma    Chest pain 11/2009   overnight hospitalization   Constipation    Dr. Loreta Ave   Fibromyalgia    GERD (gastroesophageal reflux disease)    Hyperlipidemia    Hypertension    Migraine    Nonalcoholic fatty liver disease    Past Surgical History:  Procedure Laterality Date   ABDOMINAL HYSTERECTOMY     total   COLONOSCOPY  09/15/2009   Dr. Loreta Ave; normal   WISDOM TOOTH EXTRACTION     Current Outpatient Medications  Medication Sig Dispense Refill   acetaminophen (TYLENOL) 500 MG tablet Take 1,500 mg by mouth every 6 (six) hours as needed. Reported on 05/01/2016     albuterol (PROVENTIL) (2.5 MG/3ML) 0.083% nebulizer solution Take 3 mLs (2.5 mg total) by nebulization every 6 (six) hours as needed for wheezing or shortness of breath. 150 mL 1   albuterol (VENTOLIN HFA) 108 (90 Base) MCG/ACT inhaler USE 2 PUFFS EVERY 4 TO 6 HOURS AS NEEDED 18 g 2   amLODipine (NORVASC) 5 MG tablet TAKE 1.5 TABLETS (7.5 MG TOTAL) BY MOUTH DAILY. OFFICE VISIT NEEDED FOR ADDITIONAL REFILLS 135 tablet 1   aspirin 81 MG EC tablet 1 tablet po qOD (Patient  taking differently: Take 81 mg by mouth daily.) 30 tablet 12   escitalopram (LEXAPRO) 10 MG tablet TAKE 1 TABLET BY MOUTH EVERY DAY 90 tablet 2   estradiol (ESTRACE) 0.1 MG/GM vaginal cream Place 1 g vaginally 3 (three) times a week.     estradiol (ESTRACE) 1 MG tablet Take 1 mg by mouth daily.     hydrochlorothiazide (HYDRODIURIL) 25 MG tablet TAKE 1 TABLET (25 MG TOTAL) BY MOUTH DAILY. 30 tablet 3   ibuprofen (ADVIL,MOTRIN) 600 MG tablet Take 1 tablet (600 mg total) by mouth every 6 (six) hours as needed. 30 tablet 0   linaclotide (LINZESS) 72 MCG capsule Take 72 mcg by mouth daily before breakfast.     Misc Natural Products (TART CHERRY ADVANCED PO) Take by mouth.     Multiple Vitamins-Minerals (MULTIVITAMIN WITH MINERALS) tablet Take 1 tablet by mouth daily.     Omega-3 1000 MG CAPS Take by mouth.     potassium chloride SA (KLOR-CON M) 20 MEQ tablet Take 1 tablet (20 mEq total) by mouth 2 (two) times daily for 3 days. 6 tablet 0   promethazine (PHENERGAN) 12.5 MG tablet Take 1 tablet (12.5 mg total) by mouth every 8 (eight) hours as needed for nausea or vomiting. 20 tablet 0   topiramate (TOPAMAX) 50 MG tablet Take 1 tablet (50 mg total) by mouth 2 (two) times daily. 180 tablet 2   TURMERIC PO Take by mouth.     UNABLE  TO FIND Med Name: Estradiol patch     No current facility-administered medications for this visit.   Allergies as of 10/17/2023 - Review Complete 07/23/2023  Allergen Reaction Noted   Aspirin Nausea Only 05/15/2011   Codeine Nausea Only    Cymbalta [duloxetine hcl]  09/08/2015   Simvastatin Other (See Comments) 05/15/2011   Family History  Problem Relation Age of Onset   Cancer Mother        stage III, colon   Hypertension Mother    Dementia Mother    Heart disease Mother        bradycardia   Stroke Mother    Cancer Father        prostate   Hypertension Father    Hyperlipidemia Father    Heart disease Father 37       MI    Hyperlipidemia Sister     Hypertension Sister    Fibromyalgia Sister    Arthritis Brother        RA   Hypertension Brother    Hyperlipidemia Brother    Fibromyalgia Brother    Hypertension Brother    Hyperlipidemia Brother    Healthy Daughter    Healthy Son    Lupus Niece    Review of Systems:    Constitutional: No weight loss, fever, chills, weakness or fatigue HEENT: Eyes: No change in vision               Ears, Nose, Throat:  No change in hearing or congestion Skin: No rash or itching Cardiovascular: No chest pain, chest pressure or palpitations   Respiratory: No SOB or cough Gastrointestinal: See HPI and otherwise negative Genitourinary: No dysuria or change in urinary frequency Neurological: No headache, dizziness or syncope Musculoskeletal: No new muscle or joint pain Hematologic: No bleeding or bruising Psychiatric: No history of depression or anxiety   Physical Exam:  Vital signs: There were no vitals taken for this visit.  Constitutional:   Pleasant Caucasian female appears to be in NAD, Well developed, Well nourished, alert and cooperative Throat: Oral cavity and pharynx without inflammation, swelling or lesion.  Respiratory: Respirations even and unlabored. Lungs clear to auscultation bilaterally.   No wheezes, crackles, or rhonchi.  Cardiovascular: Normal S1, S2. . Regular rate and rhythm. No peripheral edema, cyanosis or pallor.  Gastrointestinal:  Soft, nondistended, nontender. No rebound or guarding. Normal bowel sounds. No appreciable masses or hepatomegaly. Rectal:  Not performed.  Skin:   Dry and intact without significant lesions or rashes. Psychiatric: Oriented to person, place and time. Demonstrates good judgement and reason without abnormal affect or behaviors.  RELEVANT LABS AND IMAGING: CBC    Latest Ref Rng & Units 07/23/2023    9:03 PM 02/11/2023    8:24 PM 06/15/2022    6:51 PM  CBC  WBC 4.0 - 10.5 K/uL 4.5  4.6  5.2   Hemoglobin 12.0 - 15.0 g/dL 13.2  44.0  10.2    Hematocrit 36.0 - 46.0 % 40.1  38.4  36.4   Platelets 150 - 400 K/uL 243  253  266     CMP     Component Value Date/Time   NA 140 07/23/2023 2103   NA 140 09/05/2021 0840   K 2.9 (L) 07/23/2023 2103   CL 101 07/23/2023 2103   CO2 29 07/23/2023 2103   GLUCOSE 95 07/23/2023 2103   BUN 15 07/23/2023 2103   BUN 12 09/05/2021 0840   CREATININE 0.79 07/23/2023 2103   CREATININE  0.58 05/03/2021 0000   CALCIUM 9.6 07/23/2023 2103   PROT 7.5 06/15/2022 1851   ALBUMIN 3.6 06/15/2022 1851   AST 26 06/15/2022 1851   ALT 29 06/15/2022 1851   ALKPHOS 78 06/15/2022 1851   BILITOT 0.4 06/15/2022 1851   GFRNONAA >60 07/23/2023 2103   GFRNONAA 102 05/03/2021 0000   GFRAA 119 05/03/2021 0000   07/23/2023 labs show: WBC 4.5, hemoglobin 13.3, platelets 243, potassium 2.9, BUN 15/creatinine 0.79  09/16/2021 Cardiac CTA IMPRESSION: 1. No significant incidental noncardiac findings are noted  06/27/2017 NM Hepato (history of 2 prior) IMPRESSION: Patent biliary tree.Normal gallbladder ejection fraction of 66%, little changed from the 60% on the prior study of 09/02/2009.  06/30/2020 colonoscopy with Dr. Loreta Ave Impression: The entire examined colon is normal. The examined portion of the ileum was normal. No specimens collected. 07/02/2015 colonoscopy with Dr. Loreta Ave Impression: The entire examined colon is normal. Examined portion of the ileum was normal. Small internal hemorrhoids noted on retroflexion. No specimens collected. 09/15/2009 colonoscopy with Dr. Loreta Ave Impression: Normal colonoscopy to the terminal ileum.  62 year old female patient that presents with   Assessment:   Plan: -     Sherry Cuoco, FNP-C [facility] 10/17/2023, 1:18 PM  Cc: Sherry Daniels, *

## 2023-11-17 ENCOUNTER — Other Ambulatory Visit: Payer: Self-pay | Admitting: General Practice

## 2023-12-07 ENCOUNTER — Encounter: Payer: Self-pay | Admitting: Cardiovascular Disease

## 2024-01-02 ENCOUNTER — Telehealth (INDEPENDENT_AMBULATORY_CARE_PROVIDER_SITE_OTHER): Payer: 59 | Admitting: Nurse Practitioner

## 2024-01-02 ENCOUNTER — Encounter: Payer: Self-pay | Admitting: Nurse Practitioner

## 2024-01-02 VITALS — Ht 66.0 in | Wt 150.0 lb

## 2024-01-02 DIAGNOSIS — I10 Essential (primary) hypertension: Secondary | ICD-10-CM | POA: Diagnosis not present

## 2024-01-02 DIAGNOSIS — Z8669 Personal history of other diseases of the nervous system and sense organs: Secondary | ICD-10-CM

## 2024-01-02 DIAGNOSIS — M9908 Segmental and somatic dysfunction of rib cage: Secondary | ICD-10-CM | POA: Diagnosis not present

## 2024-01-02 DIAGNOSIS — J209 Acute bronchitis, unspecified: Secondary | ICD-10-CM | POA: Diagnosis not present

## 2024-01-02 MED ORDER — BENZONATATE 200 MG PO CAPS: 200.0000 mg | ORAL_CAPSULE | Freq: Three times a day (TID) | ORAL | 0 refills | Status: DC | PRN

## 2024-01-02 MED ORDER — METHYLPREDNISOLONE 4 MG PO TBPK
ORAL_TABLET | ORAL | 0 refills | Status: DC
Start: 2024-01-02 — End: 2024-03-16

## 2024-01-02 MED ORDER — AMOXICILLIN-POT CLAVULANATE 875-125 MG PO TABS
1.0000 | ORAL_TABLET | Freq: Two times a day (BID) | ORAL | 0 refills | Status: DC
Start: 2024-01-02 — End: 2024-01-08

## 2024-01-02 NOTE — Assessment & Plan Note (Addendum)
 Continue amlodipine 7.5 mg daily, hydrochlorothiazide 25 mg daily Encouraged to monitor blood pressure at home keep a log and bring to next office visit  DASH diet and commitment to daily physical activity for a minimum of 30 minutes discussed and encouraged, as a part of hypertension management. The importance of attaining a healthy weight is also discussed.    01/02/2024    1:17 PM 07/23/2023   10:45 PM 07/23/2023   10:30 PM 07/23/2023   10:15 PM 07/23/2023   10:00 PM 07/23/2023    8:46 PM 07/23/2023    8:43 PM  BP/Weight  Systolic BP  122 478 134 145  295  Diastolic BP  62 99 77 107  90  Wt. (Lbs) 150     167   BMI 24.21 kg/m2     26.95 kg/m2

## 2024-01-02 NOTE — Assessment & Plan Note (Addendum)
-   amoxicillin-clavulanate (AUGMENTIN) 875-125 MG tablet; Take 1 tablet by mouth 2 (two) times daily.  Dispense: 20 tablet; Refill: 0 - benzonatate (TESSALON) 200 MG capsule; Take 1 capsule (200 mg total) by mouth 3 (three) times daily as needed for cough.  Dispense: 20 capsule; Refill: 0 - methylPREDNISolone (MEDROL DOSEPAK) 4 MG TBPK tablet; Please take as instructed on the packaging.  Dispense: 1 each; Refill: 0 - DG Chest 2 View; Future Encouraged to drink at least 64 ounces of water daily to maintain hydration Take Tylenol 650 mg every 6 hours as needed for pain

## 2024-01-02 NOTE — Patient Instructions (Addendum)
 Around 3 times per week, check your blood pressure 2 times per day. once in the morning and once in the evening. The readings should be at least one minute apart. Write down these values and bring them to your next nurse visit/appointment.  When you check your BP, make sure you have been doing something calm/relaxing 5 minutes prior to checking. Both feet should be flat on the floor and you should be sitting. Use your left arm and make sure it is in a relaxed position (on a table), and that the cuff is at the approximate level/height of your heart.      Please get your chest x-ray done at [facility]   Rib cage dysfunction  - methylPREDNISolone (MEDROL DOSEPAK) 4 MG TBPK tablet; Please take as instructed on the packaging.  Dispense: 1 each; Refill: 0.  Please take medication with food, do not take Aleve or ibuprofen while taking methylprednisolone.  Okay to take Tylenol 650 mg every 6 hours as needed  Acute bronchitis, unspecified organism  - amoxicillin-clavulanate (AUGMENTIN) 875-125 MG tablet; Take 1 tablet by mouth 2 (two) times daily.  Dispense: 20 tablet; Refill: 0 - benzonatate (TESSALON) 200 MG capsule; Take 1 capsule (200 mg total) by mouth 3 (three) times daily as needed for cough.  Dispense: 20 capsule; Refill: 0 - methylPREDNISolone (MEDROL DOSEPAK) 4 MG TBPK tablet; Please take as instructed on the packaging.  Dispense: 1 each; Refill: 0  Please drink at least 64 ounces of water daily to maintain hydration   It is important that you exercise regularly at least 30 minutes 5 times a week as tolerated  Think about what you will eat, plan ahead. Choose " clean, green, fresh or frozen" over canned, processed or packaged foods which are more sugary, salty and fatty. 70 to 75% of food eaten should be vegetables and fruit. Three meals at set times with snacks allowed between meals, but they must be fruit or vegetables. Aim to eat over a 12 hour period , example 7 am to 7 pm,  and STOP after  your last meal of the day. Drink water,generally about 64 ounces per day, no other drink is as healthy. Fruit juice is best enjoyed in a healthy way, by EATING the fruit.  Thanks for choosing [facility] we consider it a privelige to serve you.

## 2024-01-02 NOTE — Progress Notes (Signed)
 Virtual Visit via Telephone Note  I connected with Sherry Daniels @ on 01/02/24 at  2:00 PM EST by video and verified that I am speaking with the correct person using two identifiers.  I spent 15 minutes on this telehealth encounter  Location: Patient: Home Provider: Home   I discussed the limitations, risks, security and privacy concerns of performing an evaluation and management service by telephone and the availability of in person appointments. I also discussed with the patient that there may be a patient responsible charge related to this service. The patient expressed understanding and agreed to proceed.   History of Present Illness: Sherry Daniels  has a past medical history of Allergy, Asthma, Chest pain (11/2009), Constipation, Fibromyalgia, GERD (gastroesophageal reflux disease), Hyperlipidemia, Hypertension, Migraine, and Nonalcoholic fatty liver disease.  Patient presents to establish care for her chronic medical conditions.  Previous PCP was Sherry Marseilles, NP at [facility]   Hypertension.  Currently on amlodipine 7.5 mg daily, hydrochlorothiazide 25 mg daily.  States that her home blood pressure readings has been around 130s over 90s.   Chronic migraine.  Was on Topamax but she stopped taking the medication because medication was not helping her migraine, missed her appointment with neurology.  Takes Excedrin as needed.  Rib cage pain.  Patient complains of rib cage pain on the right going on for a while states that her pain has been worse in the last few weeks, states that the site is tender to touch.  Takes ibuprofen as needed without relief.  She denies redness, swelling, trauma  Acute bronchitis .patient complains of cough, with greenish sputum, wheezing, low-grade fever, runny nose, sneezing for weeks.  She has taking OTC cough medicine without relief.  Takes OTC allergy relief medications for seasonal allergies  Goes to the [facility] up-to-date with  mammogram, had a partial hysterectomy for fibroids, only has her ovaries.   Observations/Objective: Patient alert and oriented, no sign of distress noted  Assessment and Plan: Essential hypertension, benign Continue amlodipine 7.5 mg daily, hydrochlorothiazide 25 mg daily Encouraged to monitor blood pressure at home keep a log and bring to next office visit  DASH diet and commitment to daily physical activity for a minimum of 30 minutes discussed and encouraged, as a part of hypertension management. The importance of attaining a healthy weight is also discussed.    01/02/2024    1:17 PM 07/23/2023   10:45 PM 07/23/2023   10:30 PM 07/23/2023   10:15 PM 07/23/2023   10:00 PM 07/23/2023    8:46 PM 07/23/2023    8:43 PM  BP/Weight  Systolic BP  122 147 134 145  829  Diastolic BP  62 99 77 107  90  Wt. (Lbs) 150     167   BMI 24.21 kg/m2     26.95 kg/m2        History of migraine Takes Excedrin as needed, continue current medication Encouraged to reschedule her appointment with neurologist  Rib cage dysfunction  - methylPREDNISolone (MEDROL DOSEPAK) 4 MG TBPK tablet; Please take as instructed on the packaging.  Dispense: 1 each; Refill: 0 - DG Chest 2 View; Future Patient told to take prednisone with food and not to take ibuprofen, Aleve while taking prednisone  Acute bronchitis  - amoxicillin-clavulanate (AUGMENTIN) 875-125 MG tablet; Take 1 tablet by mouth 2 (two) times daily.  Dispense: 20 tablet; Refill: 0 - benzonatate (TESSALON) 200 MG capsule; Take 1 capsule (200 mg total) by mouth 3 (  three) times daily as needed for cough.  Dispense: 20 capsule; Refill: 0 - methylPREDNISolone (MEDROL DOSEPAK) 4 MG TBPK tablet; Please take as instructed on the packaging.  Dispense: 1 each; Refill: 0 - DG Chest 2 View; Future Encouraged to drink at least 64 ounces of water daily to maintain hydration Take Tylenol 650 mg every 6 hours as needed for pain   Follow Up Instructions:    I discussed  the assessment and treatment plan with the patient. The patient was provided an opportunity to ask questions and all were answered. The patient agreed with the plan and demonstrated an understanding of the instructions.   The patient was advised to call back or seek an in-person evaluation if the symptoms worsen or if the condition fails to improve as anticipated.

## 2024-01-02 NOTE — Assessment & Plan Note (Signed)
-   methylPREDNISolone (MEDROL DOSEPAK) 4 MG TBPK tablet; Please take as instructed on the packaging.  Dispense: 1 each; Refill: 0 - DG Chest 2 View; Future Patient told to take prednisone with food and not to take ibuprofen, Aleve while taking prednisone

## 2024-01-02 NOTE — Assessment & Plan Note (Signed)
 Takes Excedrin as needed, continue current medication Encouraged to reschedule her appointment with neurologist

## 2024-01-07 NOTE — Progress Notes (Unsigned)
 Office Visit Note  Patient: Sherry Daniels             Date of Birth: [birth year]           MRN: [id number]             PCP: Donell Beers, FNP Referring: Donell Beers, FNP Visit Date: 01/08/2024 Occupation: @GUAROCC @  Subjective:  Pain in joints  History of Present Illness: Sherry Daniels is a 63 y.o. female with sicca symptoms returns today after her last visit in June 2022.  She states over the last 6 months she has been having increased pain and discomfort in the bilateral hands and bilateral feet.  She is also noticing some intermittent swelling in her joints.  She states she had right axillary lymphadenopathy October 2024.  She was seen by her GYN and also had mammogram which was negative.  She has been having some discomfort in her rib cage.  She recalls having RSV infection several weeks ago for which she continues to have some shortness of breath.  She states she was given prednisone by her PCP.  She was told that it was musculoskeletal.  He continues to have dry mouth and dry eyes.  She continues to have generalized pain from fibromyalgia.  She is retired now.  She is to work as a Museum/gallery curator.  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  She also keeps her grandson for the last 6 months.    Activities of Daily Living:  Patient reports morning stiffness for several hours.   Patient Reports nocturnal pain.  Difficulty dressing/grooming: Denies Difficulty climbing stairs: Denies Difficulty getting out of chair: Reports Difficulty using hands for taps, buttons, cutlery, and/or writing: Denies  Review of Systems  Constitutional:  Positive for fatigue.  HENT:  Positive for mouth sores and mouth dryness.   Eyes:  Positive for dryness.  Respiratory:  Positive for shortness of breath.   Cardiovascular:  Positive for chest pain and palpitations.  Gastrointestinal:  Positive for constipation. Negative for blood in stool and diarrhea.   Endocrine: Negative for increased urination.  Genitourinary:  Negative for involuntary urination.  Musculoskeletal:  Positive for joint pain, joint pain, joint swelling, myalgias, muscle weakness, morning stiffness, muscle tenderness and myalgias. Negative for gait problem.  Skin:  Positive for rash, hair loss and sensitivity to sunlight. Negative for color change.  Allergic/Immunologic: Positive for susceptible to infections.  Neurological:  Positive for headaches. Negative for dizziness.  Hematological:  Negative for swollen glands.  Psychiatric/Behavioral:  Positive for sleep disturbance. Negative for depressed mood. The patient is not nervous/anxious.     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 Loreta Ave   Fibromyalgia    GERD (gastroesophageal reflux disease)    Hyperlipidemia  Hypertension    Migraine    Nonalcoholic fatty liver disease     Family History  Problem Relation Age of Onset   Cancer Mother        stage III, colon   Hypertension Mother    Dementia Mother    Heart disease Mother        bradycardia   Stroke Mother    Cancer Father        prostate   Hypertension Father    Hyperlipidemia Father    Heart disease Father 41       MI    Hyperlipidemia Sister    Hypertension Sister    Fibromyalgia  Sister    Arthritis Brother        RA   Hypertension Brother    Hyperlipidemia Brother    Fibromyalgia Brother    Hypertension Brother    Hyperlipidemia Brother    Healthy Daughter    Healthy Son    Lupus Niece    Past Surgical History:  Procedure Laterality Date   ABDOMINAL HYSTERECTOMY     total   COLONOSCOPY  09/15/2009   Dr. Loreta Ave; normal   WISDOM TOOTH EXTRACTION     Social History   Social History Narrative   Married, 1 daughter and 1 son, pentecostal, exercise most days, walking, biking, running.    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  Constitutional:      Appearance: She is well-developed.  HENT:     Head: Normocephalic and atraumatic.  Eyes:     Conjunctiva/sclera: Conjunctivae normal.  Cardiovascular:     Rate and Rhythm: Normal rate and regular rhythm.     Heart sounds: Normal heart sounds.  Pulmonary:     Effort: Pulmonary effort is normal.     Breath sounds: Normal breath sounds.  Abdominal:     General: Bowel sounds are normal.     Palpations: Abdomen is soft.  Musculoskeletal:     Cervical back: Normal range of motion.  Lymphadenopathy:     Cervical: No cervical adenopathy.  Skin:    General: Skin is warm and dry.     Capillary Refill: Capillary refill takes less than 2 seconds.  Neurological:     Mental Status: She is alert and oriented to person, place, and time.  Psychiatric:        Behavior: Behavior normal.      Musculoskeletal Exam: Cervical, thoracic and lumbar spine were in good range of motion.  Shoulders, elbows, wrist joints, MCPs PIPs and DIPs with good  range of motion.  She had bilateral PIP and DIP thickening with no synovitis.  Hip joints and knee joints in good range of motion without any warmth swelling or effusion.  She had dorsal spurs and PIP and DIP thickening.  No synovitis was noted.  CDAI Exam: CDAI Score: -- Patient Global: --; Provider Global: -- Swollen: --; Tender: -- Joint Exam 01/08/2024   No joint exam has been documented for this visit   There is currently no information documented on the homunculus. Go to the  Rheumatology activity and complete the homunculus joint exam.  Investigation: No additional findings.  Imaging: No results found.  Recent Labs: Lab Results  Component Value Date   WBC 4.5 07/23/2023   HGB 13.3 07/23/2023   PLT 243 07/23/2023   NA 140 07/23/2023   K 2.9 (L) 07/23/2023   CL 101 07/23/2023   CO2 29 07/23/2023   GLUCOSE 95 07/23/2023   BUN 15 07/23/2023   CREATININE 0.79 07/23/2023   BILITOT 0.4 06/15/2022   ALKPHOS 78 06/15/2022   AST 26 06/15/2022   ALT 29 06/15/2022   PROT 7.5 06/15/2022   ALBUMIN 3.6 06/15/2022   CALCIUM 9.6 07/23/2023   GFRAA 119 05/03/2021    Speciality Comments: ANA negative, ENA negative, RF negative ANA initially was positive.  C1q positive, anti-CarP positive  Procedures:  No procedures performed Allergies: Aspirin, Codeine, Cymbalta [duloxetine hcl], and Simvastatin   Assessment / Plan:     Visit Diagnoses: Sicca complex (HCC) - ANA, SSA, SSB antibodies were all negative, RF negative.  Complete ENA panel was negative.  She initially had low titer ANA, C1q positive. -Patient returns today after her last visit in 2022.  She states she continues to have sicca symptoms.  She has been also experiencing increased fatigue.  She gives history of intermittent swelling in her joints.  No synovitis was noted on the examination today.  Patient is also on prednisone taper for recent upper respiratory tract infection.  I advised her to return in couple of weeks for  lab work.  Plan: Protein / creatinine ratio, urine, ANA, RNP Antibody, Anti-Smith antibody, Sjogrens syndrome-A extractable nuclear antibody, Sjogrens syndrome-B extractable nuclear antibody, Anti-DNA antibody, double-stranded, C3 and C4  Pain in both hands -she gives a chief intermittent swelling in her hands.  No synovitis was noted.  Bilateral PIP and DIP thickening suggestive of osteoarthritis was noted.  Joint protection muscle strengthening was discussed.  Plan: Sedimentation rate, Rheumatoid factor, Cyclic citrul peptide antibody, IgG, XR Hand 2 View Right, XR Hand 2 View Left.  X-rays showed only degenerative changes.  Chondromalacia of patella, unspecified laterality-currently symptomatic.  Pain in both feet -she gives history of discomfort and intermittent swelling in her feet.  No synovitis was noted.  Dorsal spurs and bilateral PIP and DIP thickening was noted.  Plan: XR Foot 2 Views Right, XR Foot 2 Views Left.  X-ray showed early degenerative changes.  Fibromyalgia-she reports having a flare of fibromyalgia syndrome with generalized pain.  She also reports having rib cage pain which could be a manifestation of fibromyalgia.  Need for regular exercise and stretching was discussed.  Patient is off Lexapro now.  Other fatigue -she has been experiencing increased fatigue.  I will obtain labs today.  Plan: CBC with Differential/Platelet, COMPLETE METABOLIC PANEL WITH GFR, CK, TSH  Vitamin D deficiency -patient has been experiencing increased fatigue.  Plan: VITAMIN D 25 Hydroxy (Vit-D Deficiency, Fractures)  Essential hypertension-blood pressure is normal at 138/87.  History of migraine  Nonalcoholic fatty liver disease-recent LS teas were normal.  Gastroesophageal reflux disease without esophagitis  History of pleurisy - 2013  Pure hypercholesterolemia  Orders: Orders Placed This Encounter  Procedures   XR Hand 2 View Right   XR Hand 2 View Left   XR Foot 2 Views Right   XR  Foot 2 Views Left   CBC with Differential/Platelet   COMPLETE METABOLIC PANEL WITH GFR   Sedimentation rate   Protein / creatinine ratio, urine   ANA  RNP Antibody   Anti-Smith antibody   Sjogrens syndrome-A extractable nuclear antibody   Sjogrens syndrome-B extractable nuclear antibody   Anti-DNA antibody, double-stranded   C3 and C4   Rheumatoid factor   Cyclic citrul peptide antibody, IgG   CK   TSH   VITAMIN D 25 Hydroxy (Vit-D Deficiency, Fractures)   No orders of the defined types were placed in this encounter.    Follow-Up Instructions: Return in about 6 weeks (around 02/19/2024) for Polyarthralgia, sicca.   Pollyann Savoy, MD  Note - This record has been created using Animal nutritionist.  Chart creation errors have been sought, but may not always  have been located. Such creation errors do not reflect on  the standard of medical care.

## 2024-01-08 ENCOUNTER — Ambulatory Visit: Payer: 59

## 2024-01-08 ENCOUNTER — Encounter: Payer: Self-pay | Admitting: Rheumatology

## 2024-01-08 ENCOUNTER — Ambulatory Visit (INDEPENDENT_AMBULATORY_CARE_PROVIDER_SITE_OTHER): Payer: 59

## 2024-01-08 ENCOUNTER — Ambulatory Visit: Payer: 59 | Attending: Rheumatology | Admitting: Rheumatology

## 2024-01-08 VITALS — BP 138/87 | HR 73 | Resp 16 | Ht 66.0 in | Wt 165.6 lb

## 2024-01-08 DIAGNOSIS — M79641 Pain in right hand: Secondary | ICD-10-CM

## 2024-01-08 DIAGNOSIS — M79671 Pain in right foot: Secondary | ICD-10-CM

## 2024-01-08 DIAGNOSIS — E559 Vitamin D deficiency, unspecified: Secondary | ICD-10-CM

## 2024-01-08 DIAGNOSIS — M79672 Pain in left foot: Secondary | ICD-10-CM

## 2024-01-08 DIAGNOSIS — K760 Fatty (change of) liver, not elsewhere classified: Secondary | ICD-10-CM

## 2024-01-08 DIAGNOSIS — M79642 Pain in left hand: Secondary | ICD-10-CM

## 2024-01-08 DIAGNOSIS — M2240 Chondromalacia patellae, unspecified knee: Secondary | ICD-10-CM

## 2024-01-08 DIAGNOSIS — M3500 Sicca syndrome, unspecified: Secondary | ICD-10-CM

## 2024-01-08 DIAGNOSIS — M797 Fibromyalgia: Secondary | ICD-10-CM

## 2024-01-08 DIAGNOSIS — Z8709 Personal history of other diseases of the respiratory system: Secondary | ICD-10-CM

## 2024-01-08 DIAGNOSIS — R748 Abnormal levels of other serum enzymes: Secondary | ICD-10-CM

## 2024-01-08 DIAGNOSIS — Z8669 Personal history of other diseases of the nervous system and sense organs: Secondary | ICD-10-CM

## 2024-01-08 DIAGNOSIS — I10 Essential (primary) hypertension: Secondary | ICD-10-CM

## 2024-01-08 DIAGNOSIS — R5383 Other fatigue: Secondary | ICD-10-CM

## 2024-01-08 DIAGNOSIS — K219 Gastro-esophageal reflux disease without esophagitis: Secondary | ICD-10-CM

## 2024-01-08 DIAGNOSIS — E7800 Pure hypercholesterolemia, unspecified: Secondary | ICD-10-CM

## 2024-01-08 NOTE — Patient Instructions (Signed)

## 2024-01-23 ENCOUNTER — Ambulatory Visit (HOSPITAL_COMMUNITY): Admission: EM | Admit: 2024-01-23 | Discharge: 2024-01-23 | Disposition: A | Discharge status: Home / Self Care

## 2024-01-23 ENCOUNTER — Ambulatory Visit: Payer: Self-pay | Admitting: Nurse Practitioner

## 2024-01-23 ENCOUNTER — Encounter (HOSPITAL_COMMUNITY): Payer: Self-pay

## 2024-01-23 DIAGNOSIS — G43019 Migraine without aura, intractable, without status migrainosus: Secondary | ICD-10-CM | POA: Diagnosis not present

## 2024-01-23 MED ORDER — KETOROLAC TROMETHAMINE 30 MG/ML IJ SOLN
30.0000 mg | Freq: Once | INTRAMUSCULAR | Status: AC
  Administered 2024-01-23: 30 mg via INTRAVENOUS

## 2024-01-23 MED ORDER — METOCLOPRAMIDE HCL 5 MG/ML IJ SOLN
10.0000 mg | Freq: Once | INTRAMUSCULAR | Status: AC
  Administered 2024-01-23: 10 mg via INTRAVENOUS

## 2024-01-23 MED ORDER — SUMATRIPTAN SUCCINATE 50 MG PO TABS
50.0000 mg | ORAL_TABLET | Freq: Two times a day (BID) | ORAL | 0 refills | Status: AC | PRN
Start: 2024-01-23 — End: ?

## 2024-01-23 MED ORDER — DEXAMETHASONE SODIUM PHOSPHATE 10 MG/ML IJ SOLN
10.0000 mg | Freq: Once | INTRAMUSCULAR | Status: AC
  Administered 2024-01-23: 10 mg via INTRAVENOUS

## 2024-01-23 MED ORDER — METOCLOPRAMIDE HCL 5 MG/ML IJ SOLN
INTRAMUSCULAR | Status: AC
  Filled 2024-01-23: qty 2

## 2024-01-23 MED ORDER — DEXAMETHASONE SODIUM PHOSPHATE 10 MG/ML IJ SOLN
INTRAMUSCULAR | Status: AC
  Filled 2024-01-23: qty 1

## 2024-01-23 MED ORDER — KETOROLAC TROMETHAMINE 30 MG/ML IJ SOLN
INTRAMUSCULAR | Status: AC
  Filled 2024-01-23: qty 1

## 2024-01-23 NOTE — Discharge Instructions (Addendum)
 1. Intractable migraine without aura and without status migrainosus (Primary) - dexamethasone (DECADRON) injection 10 mg given for acute migraine headache - metoCLOPramide (REGLAN) injection 10 mg given for acute migraine headache and nausea - ketorolac (TORADOL) 30 MG/ML injection 30 mg given for acute migraine headache - Insert peripheral IV inserted in UC for medication administration - SUMAtriptan (IMITREX) 50 MG tablet; Take 1 tablet (50 mg total) by mouth 2 (two) times daily as needed for migraine. May repeat in 2 hours if headache persists or recurs.   -Continue to monitor symptoms for any change, any increase in severity or new symptom development follow-up in ER for further evaluation management

## 2024-01-23 NOTE — ED Provider Notes (Signed)
 [facility]  Note:  This document was prepared using Dragon voice recognition software and may include unintentional dictation errors.  MRN: [id number] DOB: 05-Aug-1961  Subjective:   Sherry Daniels is a 63 y.o. female presenting for severe migraine headache with vomiting and photophobia x 2 days.  Patient reports taking Benadryl, Excedrin Migraine, Aleve, Tylenol without relief.  Patient has history of migraine headaches has been seen previously and given injections of Toradol, Benadryl, Reglan with significant improvement of symptoms.  Patient reports that she used to take sumatriptan but has ran out of prescription and did not have any medication to take.  No chest pain, shortness of breath, palpitations, weakness, dizziness.  No current facility-administered medications for this encounter.  Current Outpatient Medications:    SUMAtriptan (IMITREX) 50 MG tablet, Take 1 tablet (50 mg total) by mouth 2 (two) times daily as needed for migraine. May repeat in 2 hours if headache persists or recurs., Disp: 20 tablet, Rfl: 0   acetaminophen (TYLENOL) 500 MG tablet, Take 1,500 mg by mouth every 6 (six) hours as needed. Reported on 05/01/2016, Disp: , Rfl:    albuterol (PROVENTIL) (2.5 MG/3ML) 0.083% nebulizer solution, Take 3 mLs (2.5 mg total) by nebulization every 6 (six) hours as needed for wheezing or shortness of breath., Disp: 150 mL, Rfl: 1   albuterol (VENTOLIN HFA) 108 (90 Base) MCG/ACT inhaler, USE 2 PUFFS EVERY 4 TO 6 HOURS AS NEEDED, Disp: 18 g, Rfl: 2   amLODipine (NORVASC) 5 MG tablet, TAKE 1.5 TABLETS (7.5 MG TOTAL) BY MOUTH DAILY. OFFICE VISIT NEEDED FOR ADDITIONAL REFILLS, Disp: 135 tablet, Rfl: 1   aspirin 81 MG EC tablet, 1 tablet po qOD (Patient taking differently: Take 81 mg by mouth daily.), Disp: 30 tablet, Rfl: 12   benzonatate (TESSALON) 200 MG capsule, Take 1 capsule (200 mg total) by mouth 3 (three) times daily as needed for cough., Disp: 20 capsule,  Rfl: 0   estradiol (ESTRACE) 0.1 MG/GM vaginal cream, Place 1 g vaginally 3 (three) times a week., Disp: , Rfl:    estradiol (VIVELLE-DOT) 0.1 MG/24HR patch, Place 1 patch onto the skin 2 (two) times a week., Disp: , Rfl:    hydrochlorothiazide (HYDRODIURIL) 25 MG tablet, Take 1 tablet (25 mg total) by mouth daily., Disp: 90 tablet, Rfl: 3   ibuprofen (ADVIL,MOTRIN) 600 MG tablet, Take 1 tablet (600 mg total) by mouth every 6 (six) hours as needed., Disp: 30 tablet, Rfl: 0   methylPREDNISolone (MEDROL DOSEPAK) 4 MG TBPK tablet, Please take as instructed on the packaging., Disp: 1 each, Rfl: 0   Misc Natural Products (TART CHERRY ADVANCED PO), Take by mouth., Disp: , Rfl:    Multiple Vitamins-Minerals (MULTIVITAMIN WITH MINERALS) tablet, Take 1 tablet by mouth daily., Disp: , Rfl:    Omega-3 1000 MG CAPS, Take by mouth., Disp: , Rfl:    promethazine (PHENERGAN) 12.5 MG tablet, Take 1 tablet (12.5 mg total) by mouth every 8 (eight) hours as needed for nausea or vomiting., Disp: 20 tablet, Rfl: 0   topiramate (TOPAMAX) 50 MG tablet, Take 1 tablet (50 mg total) by mouth 2 (two) times daily., Disp: 180 tablet, Rfl: 2   TURMERIC PO, Take by mouth., Disp: , Rfl:    Allergies  Allergen Reactions   Aspirin Nausea Only   Codeine Nausea Only   Cymbalta [Duloxetine Hcl]     Felt like it worsened anxiety and fibromyalgia   Simvastatin Other (See Comments)    dizziness  Past Medical History:  Diagnosis Date   Allergy    Asthma    Chest pain 11/2009   overnight hospitalization   Constipation    Dr. Loreta Ave   Fibromyalgia    GERD (gastroesophageal reflux disease)    Hyperlipidemia    Hypertension    Migraine    Nonalcoholic fatty liver disease      Past Surgical History:  Procedure Laterality Date   ABDOMINAL HYSTERECTOMY     total   COLONOSCOPY  09/15/2009   Dr. Loreta Ave; normal   WISDOM TOOTH EXTRACTION      Family History  Problem Relation Age of Onset   Cancer Mother        stage  III, colon   Hypertension Mother    Dementia Mother    Heart disease Mother        bradycardia   Stroke Mother    Cancer Father        prostate   Hypertension Father    Hyperlipidemia Father    Heart disease Father 37       MI    Hyperlipidemia Sister    Hypertension Sister    Fibromyalgia Sister    Arthritis Brother        RA   Hypertension Brother    Hyperlipidemia Brother    Fibromyalgia Brother    Hypertension Brother    Hyperlipidemia Brother    Healthy Daughter    Healthy Son    Lupus Niece     Social History   Tobacco Use   Smoking status: Never    Passive exposure: Never   Smokeless tobacco: Never  Vaping Use   Vaping status: Never Used  Substance Use Topics   Alcohol use: No    Alcohol/week: 0.0 standard drinks of alcohol   Drug use: No    ROS Refer to HPI for ROS details.  Objective:   Vitals: BP (!) 151/85 (BP Location: Left Arm)   Pulse 69   Temp (!) 97.5 F (36.4 C) (Oral)   Resp 16   SpO2 96%   Physical Exam Vitals and nursing note reviewed.  Constitutional:      General: She is not in acute distress.    Appearance: Normal appearance. She is well-developed and normal weight. She is not ill-appearing or toxic-appearing.  HENT:     Head: Normocephalic and atraumatic.  Eyes:     General:        Right eye: No discharge.        Left eye: No discharge.     Extraocular Movements: Extraocular movements intact.     Conjunctiva/sclera: Conjunctivae normal.  Cardiovascular:     Rate and Rhythm: Normal rate.  Pulmonary:     Effort: Pulmonary effort is normal. No respiratory distress.  Skin:    General: Skin is warm and dry.  Neurological:     General: No focal deficit present.     Mental Status: She is alert and oriented to person, place, and time.  Psychiatric:        Mood and Affect: Mood normal.     Procedures  No results found for this or any previous visit (from the past 24 hours).  Assessment and Plan :   PDMP not reviewed  this encounter.  1. Intractable migraine without aura and without status migrainosus    1. Intractable migraine without aura and without status migrainosus (Primary) - dexamethasone (DECADRON) injection 10 mg given for acute migraine headache - metoCLOPramide (REGLAN) injection 10  mg given for acute migraine headache and nausea - ketorolac (TORADOL) 30 MG/ML injection 30 mg given for acute migraine headache - Insert peripheral IV inserted in UC for medication administration - SUMAtriptan (IMITREX) 50 MG tablet; Take 1 tablet (50 mg total) by mouth 2 (two) times daily as needed for migraine. May repeat in 2 hours if headache persists or recurs.   -Continue to monitor symptoms for any change, any increase in severity or new symptom development follow-up in ER for further evaluation management  Mirna Mires B, NP 01/23/24 [id number]

## 2024-01-23 NOTE — Telephone Encounter (Signed)
 Copied from CRM [id number]. Topic: Clinical - Red Word Triage >> Jan 23, 2024  3:59 PM Ivette P wrote: [facility] that prompted transfer to Nurse Triage: severe migraine - pt is at Urgent care and is nauseous and light is bothering - 2 hour wait    Chief Complaint: Migraine Symptoms: headache, nausea, vomiting Frequency: Ongoing for 2-3 days Disposition: [] ED /[] Urgent Care (no appt availability in office) / [x] Appointment(In office/virtual)/ []  [facility]/ [] Home Care/ [] Refused Recommended Disposition /[] [facility]/ []  Follow-up with PCP Additional Notes: Patient stated she has been having a migraine headache for 2-3 days. She also has been having nausea and vomiting today. She stated her pain level is currently a 9 or 10. She just went to Urgent Care and she stated she is unable to stay. The wait is long. Advised patient to try to stay at Urgent Care since it is the end of the day and we do not have any appointments for today. Patient declined. She requested to schedule an appointment for tomorrow. No appts available at PCP office until April. Appointment scheduled for tomorrow morning at a different office location.   Reason for Disposition  [1] SEVERE headache (e.g., excruciating) AND [2] not improved after 2 hours of pain medicine    Recommended for pt to be seen today. She stated she went to urgent care but does not want to stay.  Answer Assessment - Initial Assessment Questions 1. LOCATION: "Where does it hurt?"      Head  2. ONSET: "When did the headache start?" (Minutes, hours or days)      2-3 day ago  3. SEVERITY: "How bad is the pain?" and "What does it keep you from doing?"  (e.g., Scale 1-10; mild, moderate, or severe)   - MILD (1-3): doesn't interfere with normal activities    - MODERATE (4-7): interferes with normal activities or awakens from sleep    - SEVERE (8-10): excruciating pain, unable to do any normal activities        9 or 10. None of the OTC  remedies patient has tried has helped.  4. OTHER SYMPTOMS: "Do you have any other symptoms?" (fever, stiff neck, eye pain, sore throat, cold symptoms)     Bulging veins, nausea, vomiting  Protocols used: Headache-A-AH

## 2024-01-23 NOTE — ED Triage Notes (Signed)
 Patient states she has had a migraine x 2 days. Patient also endorses vomiting and light sensitivity.  Patient states she has been taking Benadryl, Excedrin Migraine, Aleve, and Tylenol.

## 2024-01-24 ENCOUNTER — Ambulatory Visit: Admitting: Family Medicine

## 2024-01-30 ENCOUNTER — Ambulatory Visit: Payer: Self-pay | Admitting: Nurse Practitioner

## 2024-02-07 NOTE — Progress Notes (Deleted)
 Office Visit Note  Patient: Sherry Daniels             Date of Birth: August 31, 1961           MRN: [id number]             PCP: Donell Beers, FNP Referring: Donell Beers, FNP Visit Date: 02/21/2024 Occupation: @GUAROCC @  Subjective:  No chief complaint on file.   History of Present Illness: Sherry Daniels is a 63 y.o. female ***     Activities of Daily Living:  Patient reports morning stiffness for *** {minute/hour:19697}.   Patient {ACTIONS;DENIES/REPORTS:21021675::"Denies"} nocturnal pain.  Difficulty dressing/grooming: {ACTIONS;DENIES/REPORTS:21021675::"Denies"} Difficulty climbing stairs: {ACTIONS;DENIES/REPORTS:21021675::"Denies"} Difficulty getting out of chair: {ACTIONS;DENIES/REPORTS:21021675::"Denies"} Difficulty using hands for taps, buttons, cutlery, and/or writing: {ACTIONS;DENIES/REPORTS:21021675::"Denies"}  No Rheumatology ROS completed.   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 Loreta Ave   Fibromyalgia    GERD (gastroesophageal reflux disease)    Hyperlipidemia    Hypertension    Migraine    Nonalcoholic fatty liver disease     Family History  Problem Relation Age of Onset   Cancer Mother        stage III, colon   Hypertension Mother    Dementia Mother    Heart  disease Mother        bradycardia   Stroke Mother    Cancer Father        prostate   Hypertension Father    Hyperlipidemia Father    Heart disease Father 69       MI    Hyperlipidemia Sister    Hypertension Sister    Fibromyalgia Sister    Arthritis Brother        RA   Hypertension Brother    Hyperlipidemia Brother    Fibromyalgia Brother    Hypertension Brother    Hyperlipidemia Brother    Healthy Daughter    Healthy Son    Lupus Niece    Past Surgical History:  Procedure Laterality Date   ABDOMINAL HYSTERECTOMY     total   COLONOSCOPY  09/15/2009   Dr. Loreta Ave; normal   WISDOM TOOTH EXTRACTION     Social History   Social History Narrative   Married, 1 daughter and 1 son, pentecostal, exercise most days, walking, biking, running.    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   Physical Exam   Musculoskeletal Exam: ***  CDAI Exam: CDAI Score: -- Patient Global: --; Provider Global: -- Swollen: --; Tender: -- Joint Exam 02/21/2024   No joint exam has been documented for this visit   There is currently no  information documented on the homunculus. Go to the Rheumatology activity and complete the homunculus joint exam.  Investigation: No additional findings.  Imaging: XR Foot 2 Views Left Result Date: 01/08/2024 First MTP, PIP and DIP narrowing was noted.  No intertarsal, tibiotalar or subtalar joint space narrowing was noted.  No erosive changes were noted. Impression: Early degenerative changes were noted in the x-rays of the foot.  XR Foot 2 Views Right Result Date: 01/08/2024 First MTP, PIP and DIP narrowing was noted.  No intertarsal, tibiotalar or subtalar joint space narrowing was noted.  No erosive changes were noted. Impression: Early degenerative changes were  noted in the x-rays of the foot.  XR Hand 2 View Left Result Date: 01/08/2024 [facility] and PIP narrowing was noted.  No MCP, intercarpal or radiocarpal joint space narrowing was noted.  No erosive changes were noted. Impression: Early degenerative changes were noted in x-rays of the hand.  XR Hand 2 View Right Result Date: 01/08/2024 [facility] and PIP narrowing was noted.  No MCP, intercarpal or radiocarpal joint space narrowing was noted.  No erosive changes were noted. Impression: Early degenerative changes were noted in x-rays of the hand.   Recent Labs: Lab Results  Component Value Date   WBC 4.5 07/23/2023   HGB 13.3 07/23/2023   PLT 243 07/23/2023   NA 140 07/23/2023   K 2.9 (L) 07/23/2023   CL 101 07/23/2023   CO2 29 07/23/2023   GLUCOSE 95 07/23/2023   BUN 15 07/23/2023   CREATININE 0.79 07/23/2023   BILITOT 0.4 06/15/2022   ALKPHOS 78 06/15/2022   AST 26 06/15/2022   ALT 29 06/15/2022   PROT 7.5 06/15/2022   ALBUMIN 3.6 06/15/2022   CALCIUM 9.6 07/23/2023   GFRAA 119 05/03/2021    Speciality Comments: ANA negative, ENA negative, RF negative ANA initially was positive.  C1q positive, anti-CarP positive  Procedures:  No procedures performed Allergies: Aspirin, Codeine, Cymbalta [duloxetine hcl], and Simvastatin   Assessment / Plan:     Visit Diagnoses: Sicca complex (HCC)  Pain in both hands  Chondromalacia of patella, unspecified laterality  Pain in both feet  Fibromyalgia  Other fatigue  Vitamin D deficiency  Essential hypertension  History of migraine  Nonalcoholic fatty liver disease  Gastroesophageal reflux disease without esophagitis  History of pleurisy  Pure hypercholesterolemia  Orders: No orders of the defined types were placed in this encounter.  No orders of the defined types were placed in this encounter.   Face-to-face time spent with patient was *** minutes. Greater than 50% of time was spent in counseling and coordination of  care.  Follow-Up Instructions: No follow-ups on file.   Gearldine Bienenstock, PA-C  Note - This record has been created using Dragon software.  Chart creation errors have been sought, but may not always  have been located. Such creation errors do not reflect on  the standard of medical care.

## 2024-02-21 ENCOUNTER — Ambulatory Visit: Payer: 59 | Admitting: Physician Assistant

## 2024-02-21 DIAGNOSIS — I10 Essential (primary) hypertension: Secondary | ICD-10-CM

## 2024-02-21 DIAGNOSIS — M797 Fibromyalgia: Secondary | ICD-10-CM

## 2024-02-21 DIAGNOSIS — Z8709 Personal history of other diseases of the respiratory system: Secondary | ICD-10-CM

## 2024-02-21 DIAGNOSIS — R5383 Other fatigue: Secondary | ICD-10-CM

## 2024-02-21 DIAGNOSIS — K219 Gastro-esophageal reflux disease without esophagitis: Secondary | ICD-10-CM

## 2024-02-21 DIAGNOSIS — E7800 Pure hypercholesterolemia, unspecified: Secondary | ICD-10-CM

## 2024-02-21 DIAGNOSIS — Z8669 Personal history of other diseases of the nervous system and sense organs: Secondary | ICD-10-CM

## 2024-02-21 DIAGNOSIS — M3500 Sicca syndrome, unspecified: Secondary | ICD-10-CM

## 2024-02-21 DIAGNOSIS — E559 Vitamin D deficiency, unspecified: Secondary | ICD-10-CM

## 2024-02-21 DIAGNOSIS — K760 Fatty (change of) liver, not elsewhere classified: Secondary | ICD-10-CM

## 2024-02-21 DIAGNOSIS — M79671 Pain in right foot: Secondary | ICD-10-CM

## 2024-02-21 DIAGNOSIS — M2240 Chondromalacia patellae, unspecified knee: Secondary | ICD-10-CM

## 2024-02-21 DIAGNOSIS — M79642 Pain in left hand: Secondary | ICD-10-CM

## 2024-02-22 NOTE — Progress Notes (Unsigned)
 Office Visit Note  Patient: Sherry Daniels             Date of Birth: Oct 26, 1961           MRN: [id number]             PCP: Paseda, Folashade R, FNP Referring: Paseda, Folashade R, FNP Visit Date: 02/25/2024 Occupation: @GUAROCC @  Subjective:  Pain in both hands and both feet  History of Present Illness: Sherry Daniels is a 63 y.o. female with history of sicca symptoms and fibromyalgia.   Patient was last seen in the office on 01/08/2024 at which time she had presented with new and worsening symptoms.  She had been taking a prednisone taper prescribed for an upper respiratory tract infection so lab work was postponed.  She did have x-rays of both hands and both feet due to persistent pain and stiffness.  Patient continues to have chronic fatigue, sicca symptoms, occasional oral ulcers, occasional facial rashes, and an elevation in temperature at times.  She is also having total body muscle pain but her biggest concern has been her increased pain in both hands and both feet. She is taking tumeric, omega 3, and tart cherry daily.   She has family history of both systemic lupus and rheumatoid arthritis.  Patient is okay with updating lab work today for further evaluation.   Activities of Daily Living:  Patient reports morning stiffness for all day. Patient Reports nocturnal pain.  Difficulty dressing/grooming: Denies Difficulty climbing stairs: Reports Difficulty getting out of chair: Denies Difficulty using hands for taps, buttons, cutlery, and/or writing: Denies  Review of Systems  Constitutional:  Positive for fatigue.  HENT:  Positive for mouth sores and mouth dryness. Negative for nose dryness.   Eyes:  Positive for dryness. Negative for pain.  Respiratory:  Positive for shortness of breath. Negative for difficulty breathing.   Cardiovascular:  Positive for chest pain and palpitations.  Gastrointestinal:  Positive for constipation and diarrhea. Negative for blood  in stool.  Endocrine: Negative for increased urination.  Genitourinary:  Positive for involuntary urination.  Musculoskeletal:  Positive for joint pain, joint pain, joint swelling, myalgias, morning stiffness, muscle tenderness and myalgias. Negative for gait problem and muscle weakness.  Skin:  Positive for rash, hair loss and sensitivity to sunlight. Negative for color change.  Allergic/Immunologic: Positive for susceptible to infections.  Neurological:  Positive for headaches. Negative for dizziness.  Hematological:  Positive for swollen glands.  Psychiatric/Behavioral:  Positive for sleep disturbance. Negative for depressed mood. The patient is not nervous/anxious.     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 Tova Fresh   Fibromyalgia    GERD (gastroesophageal reflux disease)    Hyperlipidemia    Hypertension    Migraine    Nonalcoholic fatty liver  disease     Family History  Problem Relation Age of Onset   Cancer Mother        stage III, colon   Hypertension Mother    Dementia Mother    Heart disease Mother        bradycardia   Stroke Mother    Cancer Father        prostate   Hypertension Father    Hyperlipidemia Father    Heart disease Father 83       MI    Hyperlipidemia Sister    Hypertension Sister     Fibromyalgia Sister    Arthritis Brother        RA   Hypertension Brother    Hyperlipidemia Brother    Fibromyalgia Brother    Hypertension Brother    Hyperlipidemia Brother    Healthy Daughter    Healthy Son    Lupus Niece    Past Surgical History:  Procedure Laterality Date   ABDOMINAL HYSTERECTOMY     total   COLONOSCOPY  09/15/2009   Dr. Tova Fresh; normal   WISDOM TOOTH EXTRACTION     Social History   Social History Narrative   Married, 1 daughter and 1 son, pentecostal, exercise most days, walking, biking, running.    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  Constitutional:      Appearance: She is well-developed.  HENT:     Head: Normocephalic and atraumatic.  Eyes:     Conjunctiva/sclera: Conjunctivae normal.  Cardiovascular:     Rate and Rhythm: Normal rate and regular rhythm.     Heart sounds: Normal heart sounds.  Pulmonary:     Effort: Pulmonary effort is normal.     Breath sounds: Normal breath sounds.  Abdominal:     General: Bowel sounds are normal.     Palpations: Abdomen is soft.  Musculoskeletal:     Cervical back: Normal range of motion.  Lymphadenopathy:     Cervical: No cervical adenopathy.  Skin:    General: Skin is warm and dry.     Capillary Refill: Capillary refill takes less than 2 seconds.  Neurological:     Mental Status: She is alert and oriented to person, place, and time.  Psychiatric:        Behavior: Behavior normal.      Musculoskeletal Exam: Generalized hyperalgesia and positive tender points on exam.  C-spine, thoracic spine, lumbar spine have  good range of motion.  Shoulder joints, elbow joints, wrist joints and MCPs have good range of motion with no synovitis.  Tenderness over bilateral first PIP joints in all DIP joints of both hands.  Hip joints have good ROM with tenderness over both trochanteric bursa.  Knee joints have good ROM with no warmth or effusion.  Ankle joints have good ROM with no inflammation.  Tenderness across all MTP joints.   CDAI Exam: CDAI Score: -- Patient Global: --; Provider Global: -- Swollen: --; Tender: -- Joint Exam 02/25/2024   No joint exam has been documented for this visit  There is currently no information documented on the homunculus. Go to the Rheumatology activity and complete the homunculus joint exam.  Investigation: No additional findings.  Imaging: No results found.   Recent Labs: Lab Results  Component Value Date   WBC 4.5 07/23/2023   HGB 13.3 07/23/2023   PLT 243 07/23/2023   NA 140 07/23/2023   K 2.9 (L) 07/23/2023   CL 101 07/23/2023   CO2 29 07/23/2023   GLUCOSE 95 07/23/2023   BUN 15 07/23/2023   CREATININE 0.79 07/23/2023   BILITOT 0.4 06/15/2022   ALKPHOS 78 06/15/2022   AST 26 06/15/2022   ALT 29 06/15/2022   PROT 7.5 06/15/2022   ALBUMIN 3.6 06/15/2022   CALCIUM 9.6 07/23/2023   GFRAA 119 05/03/2021    Speciality Comments: ANA negative, ENA negative, RF negative ANA initially was positive.  C1q positive, anti-CarP positive  Procedures:  No procedures performed Allergies: Aspirin, Codeine, Cymbalta [duloxetine hcl], and Simvastatin   Assessment / Plan:     Visit Diagnoses: Positive ANA (antinuclear antibody) -Lab work from 05/03/2021 was reviewed today in the office: ANA 1: 320 nuclear fine speckled, 1: 80 mitotic, intercellular bridge, ESR within normal limits, complements within normal limits, double-stranded DNA negative.  Patient presents today with several new and worsening concerns.  She has been having increased fatigue, arthralgias, myalgias,  sicca symptoms, occasional oral ulcers, and intermittent facial rashes.  Her symptoms have been following a flare pattern and she is concerned about the possible diagnosis of lupus given her family history of lupus as well as rheumatoid arthritis.  Been experiencing increased pain and stiffness involving both hands and both feet.  No obvious synovitis was noted on examination today.  X-rays of both hands and feet were consistent with early degenerative change and no erosive changes on 01/08/2024.  Discussed that we can schedule an ultrasound to assess for synovitis if her symptoms persist or worsen and if labs are inconclusive.  Plan to obtain the following lab work today for further evaluation.  We will call her with these results and can schedule a sooner office visit if any medication changes need to be made. plan: Protein / creatinine ratio, urine, CBC with Differential/Platelet, Anti-DNA antibody, double-stranded, ANA, C3 and C4, Sedimentation rate, VITAMIN D 25 Hydroxy (Vit-D Deficiency, Fractures), Comprehensive metabolic panel with GFR, TSH, CK, Anti-scleroderma antibody, RNP Antibody, Anti-Smith antibody, Sjogrens syndrome-A extractable nuclear antibody, Sjogrens syndrome-B extractable nuclear antibody, Rheumatoid factor, Cyclic citrul peptide antibody, IgG, Mutated Citrullinated Vimentin (MCV) Antibody  Sicca complex (HCC) - ANA, SSA, SSB antibodies were all negative, RF negative.  Complete ENA panel was negative.  She initially had low titer ANA, C1q positive. ANA was positive on 05/03/2021.  The following lab work will be obtained today for further evaluation.- Plan: Sjogrens syndrome-A extractable nuclear antibody, Sjogrens syndrome-B extractable nuclear antibody  Pain in both hands -x-rays of both hands were updated at her last office visit on 01/08/2024 which were consistent with early degenerative changes.  No erosive changes were noted.  No synovitis was noted on examination at that time.   Patient continues to have persistent pain and stiffness involving both hands.  Most of her discomfort is in the DIP joints.  Plan to obtain the following lab work today for further evaluation.  The patient reports she has a strong family history of rheumatoid arthritis.  Discussed that if lab work is inconclusive and if she continues to have persistent symptoms we can schedule an ultrasound to assess for  synovitis.  Plan: Sedimentation rate, Rheumatoid factor, Cyclic citrul peptide antibody, IgG, Mutated Citrullinated Vimentin (MCV) Antibody  Chondromalacia of patella, unspecified laterality: She has good range of motion of both knee joints on examination today.  No warmth or effusion noted.  Pain in both feet - X-ray showed early degenerative changes on 01/08/2024.  No erosive changes noted.  Patient continues to have persistent discomfort in both feet.  Most of her tenderness is over the MTP joints.  No synovitis or dactylitis was noted.  Plan to obtain the following lab work today for further evaluation.- Plan: Sedimentation rate, Rheumatoid factor, Cyclic citrul peptide antibody, IgG, Mutated Citrullinated Vimentin (MCV) Antibody  Fibromyalgia: She has generalized hyperalgesia and positive tender points on exam.  Patient continues to experience generalized myofascial pain consistent with fibromyalgia.  She has also had increased fatigue.  Plan to obtain the following lab work today for further evaluation.  Other fatigue -Patient presents today with ongoing fatigue on a daily basis.  Plan to obtain the following lab work today.    Plan: Protein / creatinine ratio, urine, CBC with Differential/Platelet, Anti-DNA antibody, double-stranded, ANA, C3 and C4, Sedimentation rate, VITAMIN D 25 Hydroxy (Vit-D Deficiency, Fractures), Comprehensive metabolic panel with GFR, TSH, CK, Anti-scleroderma antibody, RNP Antibody, Anti-Smith antibody, Sjogrens syndrome-A extractable nuclear antibody, Sjogrens syndrome-B  extractable nuclear antibody, Rheumatoid factor, Cyclic citrul peptide antibody, IgG, Mutated Citrullinated Vimentin (MCV) Antibody  Vitamin D deficiency - Vitamin D will be rechecked today. Plan: VITAMIN D 25 Hydroxy (Vit-D Deficiency, Fractures)  Other medical conditions are listed as follows:  Essential hypertension: Blood pressure was elevated today in the office was rechecked prior to leaving.  Patient was advised to monitor blood pressure closely and to return to PCP if her blood pressure remains elevated.  History of migraine  Nonalcoholic fatty liver disease  Gastroesophageal reflux disease without esophagitis  History of pleurisy - 2013  Pure hypercholesterolemia   Orders: Orders Placed This Encounter  Procedures   Protein / creatinine ratio, urine   CBC with Differential/Platelet   Anti-DNA antibody, double-stranded   ANA   C3 and C4   Sedimentation rate   VITAMIN D 25 Hydroxy (Vit-D Deficiency, Fractures)   Comprehensive metabolic panel with GFR   TSH   CK   Anti-scleroderma antibody   RNP Antibody   Anti-Smith antibody   Sjogrens syndrome-A extractable nuclear antibody   Sjogrens syndrome-B extractable nuclear antibody   Rheumatoid factor   Cyclic citrul peptide antibody, IgG   Mutated Citrullinated Vimentin (MCV) Antibody   No orders of the defined types were placed in this encounter.   Follow-Up Instructions: Return in about 6 months (around 08/26/2024) for Sicca symptoms , Fibromyalgia.   Romayne Clubs, PA-C  Note - This record has been created using Dragon software.  Chart creation errors have been sought, but may not always  have been located. Such creation errors do not reflect on  the standard of medical care.

## 2024-02-25 ENCOUNTER — Ambulatory Visit: Attending: Physician Assistant | Admitting: Physician Assistant

## 2024-02-25 ENCOUNTER — Encounter: Payer: Self-pay | Admitting: Physician Assistant

## 2024-02-25 VITALS — BP 152/91 | HR 54 | Resp 14 | Ht 66.0 in | Wt 169.2 lb

## 2024-02-25 DIAGNOSIS — M79671 Pain in right foot: Secondary | ICD-10-CM

## 2024-02-25 DIAGNOSIS — Z8709 Personal history of other diseases of the respiratory system: Secondary | ICD-10-CM

## 2024-02-25 DIAGNOSIS — K760 Fatty (change of) liver, not elsewhere classified: Secondary | ICD-10-CM

## 2024-02-25 DIAGNOSIS — M3500 Sicca syndrome, unspecified: Secondary | ICD-10-CM

## 2024-02-25 DIAGNOSIS — Z8669 Personal history of other diseases of the nervous system and sense organs: Secondary | ICD-10-CM

## 2024-02-25 DIAGNOSIS — M79672 Pain in left foot: Secondary | ICD-10-CM

## 2024-02-25 DIAGNOSIS — M2240 Chondromalacia patellae, unspecified knee: Secondary | ICD-10-CM | POA: Diagnosis not present

## 2024-02-25 DIAGNOSIS — E7800 Pure hypercholesterolemia, unspecified: Secondary | ICD-10-CM

## 2024-02-25 DIAGNOSIS — R5383 Other fatigue: Secondary | ICD-10-CM

## 2024-02-25 DIAGNOSIS — M79642 Pain in left hand: Secondary | ICD-10-CM

## 2024-02-25 DIAGNOSIS — I10 Essential (primary) hypertension: Secondary | ICD-10-CM

## 2024-02-25 DIAGNOSIS — M79641 Pain in right hand: Secondary | ICD-10-CM | POA: Diagnosis not present

## 2024-02-25 DIAGNOSIS — E559 Vitamin D deficiency, unspecified: Secondary | ICD-10-CM

## 2024-02-25 DIAGNOSIS — R768 Other specified abnormal immunological findings in serum: Secondary | ICD-10-CM

## 2024-02-25 DIAGNOSIS — K219 Gastro-esophageal reflux disease without esophagitis: Secondary | ICD-10-CM

## 2024-02-25 DIAGNOSIS — M797 Fibromyalgia: Secondary | ICD-10-CM

## 2024-02-26 NOTE — Progress Notes (Signed)
 Vitamin D is low-22.  Please notify the patient and send in vitamin D 50,000 units once weekly x3 months. Recheck vitamin D in 3 months

## 2024-02-27 ENCOUNTER — Other Ambulatory Visit: Payer: Self-pay | Admitting: *Deleted

## 2024-02-27 DIAGNOSIS — E559 Vitamin D deficiency, unspecified: Secondary | ICD-10-CM

## 2024-02-27 MED ORDER — VITAMIN D (ERGOCALCIFEROL) 1.25 MG (50000 UNIT) PO CAPS: 50000.0000 [IU] | ORAL_CAPSULE | ORAL | 0 refills | Status: DC

## 2024-02-27 NOTE — Telephone Encounter (Signed)
-----   Message from Sherry Daniels sent at 02/26/2024  6:42 AM EDT ----- Vitamin D is low-22.  Please notify the patient and send in vitamin D 50,000 units once weekly x3 months. Recheck vitamin D in 3 months

## 2024-02-28 NOTE — Progress Notes (Signed)
 White blood cell count is low at 3.5.  Absolute neutrophils low.  hemoglobin low at 11.6.  Recommend repeating CBC with differential in 1 month. CMP within normal limits.  ANA remains positive. ESR within normal limits. Double-stranded DNA negative, complements within normal limits, Smith antibody negative, RNP antibody is now negative. SCL 70 negative Ro and La antibodies negative.  RF and anti-CCP negative. TSH within normal limits, CK within normal limits  MCV pending

## 2024-03-03 ENCOUNTER — Telehealth: Payer: Self-pay | Admitting: *Deleted

## 2024-03-03 DIAGNOSIS — D72819 Decreased white blood cell count, unspecified: Secondary | ICD-10-CM

## 2024-03-03 NOTE — Telephone Encounter (Signed)
-----   Message from Romayne Clubs sent at 02/28/2024  9:43 PM EDT ----- Camilo Cella blood cell count is low at 3.5.  Absolute neutrophils low.  hemoglobin low at 11.6.  Recommend repeating CBC with differential in 1 month. CMP within normal limits.  ANA remains positive. ESR within normal limits. Double-stranded DNA negative, complements within normal limits, Smith antibody negative, RNP antibody is now negative. SCL 70 negative Ro and La antibodies negative.  RF and anti-CCP negative. TSH within normal limits, CK within normal limits  MCV pending

## 2024-03-04 LAB — CBC WITH DIFFERENTIAL/PLATELET
Absolute Lymphocytes: 1873 {cells}/uL (ref 850–3900)
Absolute Monocytes: 333 {cells}/uL (ref 200–950)
Basophils Absolute: 32 {cells}/uL (ref 0–200)
Basophils Relative: 0.9 %
Eosinophils Absolute: 102 {cells}/uL (ref 15–500)
Eosinophils Relative: 2.9 %
HCT: 35.2 % (ref 35.0–45.0)
Hemoglobin: 11.6 g/dL — ABNORMAL LOW (ref 11.7–15.5)
MCH: 29.1 pg (ref 27.0–33.0)
MCHC: 33 g/dL (ref 32.0–36.0)
MCV: 88.4 fL (ref 80.0–100.0)
MPV: 10.8 fL (ref 7.5–12.5)
Monocytes Relative: 9.5 %
Neutro Abs: 1162 {cells}/uL — ABNORMAL LOW (ref 1500–7800)
Neutrophils Relative %: 33.2 %
Platelets: 236 10*3/uL (ref 140–400)
RBC: 3.98 10*6/uL (ref 3.80–5.10)
RDW: 13.1 % (ref 11.0–15.0)
Total Lymphocyte: 53.5 %
WBC: 3.5 10*3/uL — ABNORMAL LOW (ref 3.8–10.8)

## 2024-03-04 LAB — COMPREHENSIVE METABOLIC PANEL WITH GFR
AG Ratio: 1.3 (calc) (ref 1.0–2.5)
ALT: 26 U/L (ref 6–29)
AST: 16 U/L (ref 10–35)
Albumin: 3.7 g/dL (ref 3.6–5.1)
Alkaline phosphatase (APISO): 57 U/L (ref 37–153)
BUN: 10 mg/dL (ref 7–25)
CO2: 29 mmol/L (ref 20–32)
Calcium: 9.3 mg/dL (ref 8.6–10.4)
Chloride: 105 mmol/L (ref 98–110)
Creat: 0.64 mg/dL (ref 0.50–1.05)
Globulin: 2.9 g/dL (ref 1.9–3.7)
Glucose, Bld: 81 mg/dL (ref 65–99)
Potassium: 3.7 mmol/L (ref 3.5–5.3)
Sodium: 140 mmol/L (ref 135–146)
Total Bilirubin: 0.3 mg/dL (ref 0.2–1.2)
Total Protein: 6.6 g/dL (ref 6.1–8.1)
eGFR: 101 mL/min/{1.73_m2} (ref >=60)

## 2024-03-04 LAB — PROTEIN / CREATININE RATIO, URINE
Creatinine, Urine: 148 mg/dL (ref 20–275)
Protein/Creat Ratio: 41 mg/g{creat} (ref 24–184)
Protein/Creatinine Ratio: 0.041 mg/mg{creat} (ref 0.024–0.184)
Total Protein, Urine: 6 mg/dL (ref 5–24)

## 2024-03-04 LAB — C3 AND C4
C3 Complement: 142 mg/dL (ref 83–193)
C4 Complement: 21 mg/dL (ref 15–57)

## 2024-03-04 LAB — VITAMIN D 25 HYDROXY (VIT D DEFICIENCY, FRACTURES): Vit D, 25-Hydroxy: 22 ng/mL — ABNORMAL LOW (ref 30–100)

## 2024-03-04 LAB — ANTI-NUCLEAR AB-TITER (ANA TITER)
ANA TITER: 1:80 {titer} — ABNORMAL HIGH
ANA Titer 1: 1:640 {titer} — ABNORMAL HIGH

## 2024-03-04 LAB — SJOGRENS SYNDROME-A EXTRACTABLE NUCLEAR ANTIBODY: SSA (Ro) (ENA) Antibody, IgG: <1 AI

## 2024-03-04 LAB — CYCLIC CITRUL PEPTIDE ANTIBODY, IGG: Cyclic Citrullin Peptide Ab: <16 U

## 2024-03-04 LAB — ANTI-SMITH ANTIBODY: ENA SM Ab Ser-aCnc: <1 AI

## 2024-03-04 LAB — ANA: Anti Nuclear Antibody (ANA): POSITIVE — AB

## 2024-03-04 LAB — SJOGRENS SYNDROME-B EXTRACTABLE NUCLEAR ANTIBODY: SSB (La) (ENA) Antibody, IgG: <1 AI

## 2024-03-04 LAB — MUTATED CITRULLINATED VIMENTIN (MCV) ANTIBODY: MUTATED CITRULLINATED VIMENTIN (MCV) AB: <20 U/mL (ref <20)

## 2024-03-04 LAB — SEDIMENTATION RATE: Sed Rate: 22 mm/h (ref 0–30)

## 2024-03-04 LAB — RNP ANTIBODY: Ribonucleic Protein(ENA) Antibody, IgG: <1 AI

## 2024-03-04 LAB — TSH: TSH: 0.99 m[IU]/L (ref 0.40–4.50)

## 2024-03-04 LAB — ANTI-SCLERODERMA ANTIBODY: Scleroderma (Scl-70) (ENA) Antibody, IgG: <1 AI

## 2024-03-04 LAB — ANTI-DNA ANTIBODY, DOUBLE-STRANDED: ds DNA Ab: 1 [IU]/mL

## 2024-03-04 LAB — CK: Total CK: 61 U/L (ref 20–243)

## 2024-03-04 LAB — RHEUMATOID FACTOR: Rheumatoid fact SerPl-aCnc: 11 [IU]/mL (ref <14)

## 2024-03-04 NOTE — Progress Notes (Signed)
 MCV negative

## 2024-03-06 ENCOUNTER — Telehealth: Payer: Self-pay | Admitting: *Deleted

## 2024-03-06 NOTE — Telephone Encounter (Signed)
 Patient contacted the office concerned about her liver functions.   Returned call to patient. She states she was under the impression her liver function were going to be checked. Patient states she has a history of elevated liver functions due to fatty liver disease. Advised patient we did check her liver functions in the CMP that was drawn. Patient advised her AST and ALT were within the normal limits. Patient expressed understanding.

## 2024-03-06 NOTE — Telephone Encounter (Signed)
 Opened in error

## 2024-03-14 ENCOUNTER — Inpatient Hospital Stay (HOSPITAL_COMMUNITY)
Admission: EM | Admit: 2024-03-14 | Discharge: 2024-03-16 | DRG: 281 | Disposition: A | Discharge status: Home / Self Care | Attending: Internal Medicine | Admitting: Internal Medicine

## 2024-03-14 ENCOUNTER — Emergency Department (HOSPITAL_COMMUNITY)

## 2024-03-14 ENCOUNTER — Encounter (HOSPITAL_COMMUNITY)
Admission: EM | Disposition: A | Discharge status: Home / Self Care | Payer: Self-pay | Source: Home / Self Care | Attending: Internal Medicine

## 2024-03-14 ENCOUNTER — Other Ambulatory Visit: Payer: Self-pay

## 2024-03-14 ENCOUNTER — Ambulatory Visit (HOSPITAL_COMMUNITY): Admit: 2024-03-14 | Admitting: Cardiovascular Disease

## 2024-03-14 DIAGNOSIS — I10 Essential (primary) hypertension: Secondary | ICD-10-CM | POA: Diagnosis not present

## 2024-03-14 DIAGNOSIS — Z823 Family history of stroke: Secondary | ICD-10-CM

## 2024-03-14 DIAGNOSIS — I252 Old myocardial infarction: Secondary | ICD-10-CM | POA: Diagnosis not present

## 2024-03-14 DIAGNOSIS — K219 Gastro-esophageal reflux disease without esophagitis: Secondary | ICD-10-CM | POA: Diagnosis present

## 2024-03-14 DIAGNOSIS — E782 Mixed hyperlipidemia: Secondary | ICD-10-CM | POA: Diagnosis present

## 2024-03-14 DIAGNOSIS — I5181 Takotsubo syndrome: Secondary | ICD-10-CM | POA: Diagnosis present

## 2024-03-14 DIAGNOSIS — I5022 Chronic systolic (congestive) heart failure: Secondary | ICD-10-CM | POA: Diagnosis present

## 2024-03-14 DIAGNOSIS — E876 Hypokalemia: Secondary | ICD-10-CM | POA: Diagnosis present

## 2024-03-14 DIAGNOSIS — Z8249 Family history of ischemic heart disease and other diseases of the circulatory system: Secondary | ICD-10-CM | POA: Diagnosis not present

## 2024-03-14 DIAGNOSIS — I428 Other cardiomyopathies: Secondary | ICD-10-CM | POA: Diagnosis present

## 2024-03-14 DIAGNOSIS — J4520 Mild intermittent asthma, uncomplicated: Secondary | ICD-10-CM | POA: Insufficient documentation

## 2024-03-14 DIAGNOSIS — I959 Hypotension, unspecified: Secondary | ICD-10-CM | POA: Diagnosis not present

## 2024-03-14 DIAGNOSIS — M3500 Sicca syndrome, unspecified: Secondary | ICD-10-CM | POA: Diagnosis present

## 2024-03-14 DIAGNOSIS — I3139 Other pericardial effusion (noninflammatory): Secondary | ICD-10-CM | POA: Diagnosis present

## 2024-03-14 DIAGNOSIS — I214 Non-ST elevation (NSTEMI) myocardial infarction: Principal | ICD-10-CM | POA: Diagnosis present

## 2024-03-14 DIAGNOSIS — Z8261 Family history of arthritis: Secondary | ICD-10-CM | POA: Diagnosis not present

## 2024-03-14 DIAGNOSIS — M797 Fibromyalgia: Secondary | ICD-10-CM | POA: Diagnosis present

## 2024-03-14 DIAGNOSIS — Z83438 Family history of other disorder of lipoprotein metabolism and other lipidemia: Secondary | ICD-10-CM

## 2024-03-14 DIAGNOSIS — R079 Chest pain, unspecified: Secondary | ICD-10-CM | POA: Diagnosis not present

## 2024-03-14 DIAGNOSIS — I110 Hypertensive heart disease with heart failure: Secondary | ICD-10-CM | POA: Diagnosis present

## 2024-03-14 DIAGNOSIS — Z90710 Acquired absence of both cervix and uterus: Secondary | ICD-10-CM

## 2024-03-14 DIAGNOSIS — K760 Fatty (change of) liver, not elsewhere classified: Secondary | ICD-10-CM | POA: Diagnosis present

## 2024-03-14 DIAGNOSIS — Z7982 Long term (current) use of aspirin: Secondary | ICD-10-CM

## 2024-03-14 DIAGNOSIS — I2510 Atherosclerotic heart disease of native coronary artery without angina pectoris: Secondary | ICD-10-CM | POA: Diagnosis present

## 2024-03-14 DIAGNOSIS — G43909 Migraine, unspecified, not intractable, without status migrainosus: Secondary | ICD-10-CM | POA: Diagnosis present

## 2024-03-14 HISTORY — PX: LEFT HEART CATH AND CORONARY ANGIOGRAPHY: CATH118249

## 2024-03-14 LAB — BASIC METABOLIC PANEL WITH GFR
Anion gap: 8 (ref 5–15)
BUN: 13 mg/dL (ref 6–20)
CO2: 26 mmol/L (ref 22–32)
Calcium: 9.2 mg/dL (ref 8.9–10.3)
Chloride: 104 mmol/L (ref 98–111)
Creatinine, Ser: 0.7 mg/dL (ref 0.44–1.00)
GFR, Estimated: >60 mL/min (ref >60)
Glucose, Bld: 156 mg/dL — ABNORMAL HIGH (ref 70–99)
Potassium: 3.7 mmol/L (ref 3.5–5.1)
Sodium: 138 mmol/L (ref 135–145)

## 2024-03-14 LAB — CBC
HCT: 38.9 % (ref 36.0–46.0)
HCT: 37.8 % (ref 36.0–46.0)
Hemoglobin: 12.5 g/dL (ref 12.0–15.0)
Hemoglobin: 12.4 g/dL (ref 12.0–15.0)
MCH: 29.6 pg (ref 26.0–34.0)
MCH: 29 pg (ref 26.0–34.0)
MCHC: 32.1 g/dL (ref 30.0–36.0)
MCHC: 32.8 g/dL (ref 30.0–36.0)
MCV: 92 fL (ref 80.0–100.0)
MCV: 88.5 fL (ref 80.0–100.0)
Platelets: 222 10*3/uL (ref 150–400)
Platelets: 242 10*3/uL (ref 150–400)
RBC: 4.23 MIL/uL (ref 3.87–5.11)
RBC: 4.27 MIL/uL (ref 3.87–5.11)
RDW: 13.5 % (ref 11.5–15.5)
RDW: 13.4 % (ref 11.5–15.5)
WBC: 3 10*3/uL — ABNORMAL LOW (ref 4.0–10.5)
WBC: 6.6 10*3/uL (ref 4.0–10.5)
nRBC: 0 % (ref 0.0–0.2)
nRBC: 0 % (ref 0.0–0.2)

## 2024-03-14 LAB — TROPONIN I (HIGH SENSITIVITY)
Troponin I (High Sensitivity): 208 ng/L (ref <18)
Troponin I (High Sensitivity): 1380 ng/L (ref <18)

## 2024-03-14 LAB — D-DIMER, QUANTITATIVE: D-Dimer, Quant: 0.66 ug{FEU}/mL — ABNORMAL HIGH (ref 0.00–0.50)

## 2024-03-14 LAB — CREATININE, SERUM
Creatinine, Ser: 0.66 mg/dL (ref 0.44–1.00)
GFR, Estimated: >60 mL/min (ref >60)

## 2024-03-14 LAB — HIV ANTIBODY (ROUTINE TESTING W REFLEX): HIV Screen 4th Generation wRfx: NONREACTIVE

## 2024-03-14 SURGERY — LEFT HEART CATH AND CORONARY ANGIOGRAPHY
Anesthesia: LOCAL

## 2024-03-14 MED ORDER — DIAZEPAM 5 MG PO TABS: 5.0000 mg | ORAL_TABLET | Freq: Three times a day (TID) | ORAL | Status: DC | PRN

## 2024-03-14 MED ORDER — NITROGLYCERIN 0.4 MG SL SUBL
0.4000 mg | SUBLINGUAL_TABLET | SUBLINGUAL | Status: DC | PRN
  Administered 2024-03-14 (×3): 0.4 mg via SUBLINGUAL
  Filled 2024-03-14: qty 1

## 2024-03-14 MED ORDER — VERAPAMIL HCL 2.5 MG/ML IV SOLN
INTRAVENOUS | Status: AC
Start: 2024-03-14 — End: ?
  Filled 2024-03-14: qty 2

## 2024-03-14 MED ORDER — ACETAMINOPHEN 325 MG PO TABS
650.0000 mg | ORAL_TABLET | Freq: Four times a day (QID) | ORAL | Status: DC | PRN
  Administered 2024-03-14 – 2024-03-15 (×2): 650 mg via ORAL
  Filled 2024-03-14 (×2): qty 2

## 2024-03-14 MED ORDER — SODIUM CHLORIDE 0.9 % IV SOLN: 250.0000 mL | INTRAVENOUS | Status: AC | PRN

## 2024-03-14 MED ORDER — HEPARIN BOLUS VIA INFUSION
4000.0000 [IU] | Freq: Once | INTRAVENOUS | Status: AC
  Administered 2024-03-14: 4000 [IU] via INTRAVENOUS
  Filled 2024-03-14: qty 4000

## 2024-03-14 MED ORDER — NITROGLYCERIN IN D5W 200-5 MCG/ML-% IV SOLN: 0.0000 ug/min | INTRAVENOUS | Status: DC

## 2024-03-14 MED ORDER — MIDAZOLAM HCL 2 MG/2ML IJ SOLN
INTRAMUSCULAR | Status: DC | PRN
  Administered 2024-03-14: 2 mg via INTRAVENOUS

## 2024-03-14 MED ORDER — ONDANSETRON HCL 4 MG PO TABS
4.0000 mg | ORAL_TABLET | Freq: Four times a day (QID) | ORAL | Status: DC | PRN
Start: 2024-03-14 — End: 2024-03-16

## 2024-03-14 MED ORDER — HEPARIN (PORCINE) 25000 UT/250ML-% IV SOLN
900.0000 [IU]/h | INTRAVENOUS | Status: DC
  Administered 2024-03-14: 900 [IU]/h via INTRAVENOUS
  Filled 2024-03-14: qty 250

## 2024-03-14 MED ORDER — FENTANYL CITRATE (PF) 100 MCG/2ML IJ SOLN
INTRAMUSCULAR | Status: DC | PRN
  Administered 2024-03-14: 25 ug via INTRAVENOUS

## 2024-03-14 MED ORDER — METOPROLOL SUCCINATE ER 25 MG PO TB24: 25.0000 mg | ORAL_TABLET | Freq: Every day | ORAL | Status: DC

## 2024-03-14 MED ORDER — ACETAMINOPHEN 500 MG PO TABS
1000.0000 mg | ORAL_TABLET | Freq: Once | ORAL | Status: AC
  Administered 2024-03-14: 1000 mg via ORAL
  Filled 2024-03-14: qty 2

## 2024-03-14 MED ORDER — LIDOCAINE HCL (PF) 1 % IJ SOLN
INTRAMUSCULAR | Status: AC
Start: 2024-03-14 — End: ?
  Filled 2024-03-14: qty 30

## 2024-03-14 MED ORDER — LIDOCAINE HCL (PF) 1 % IJ SOLN
INTRAMUSCULAR | Status: DC | PRN
  Administered 2024-03-14: 2 mL

## 2024-03-14 MED ORDER — AMLODIPINE BESYLATE 5 MG PO TABS: 7.5000 mg | ORAL_TABLET | Freq: Every day | ORAL | Status: DC

## 2024-03-14 MED ORDER — HYDROCHLOROTHIAZIDE 25 MG PO TABS
25.0000 mg | ORAL_TABLET | Freq: Every day | ORAL | Status: DC
Start: 2024-03-14 — End: 2024-03-15

## 2024-03-14 MED ORDER — IOHEXOL 350 MG/ML SOLN
75.0000 mL | Freq: Once | INTRAVENOUS | Status: AC | PRN
  Administered 2024-03-14: 75 mL via INTRAVENOUS

## 2024-03-14 MED ORDER — HYDRALAZINE HCL 20 MG/ML IJ SOLN: 10.0000 mg | Freq: Four times a day (QID) | INTRAMUSCULAR | Status: DC | PRN

## 2024-03-14 MED ORDER — ASPIRIN 81 MG PO CHEW
324.0000 mg | CHEWABLE_TABLET | Freq: Once | ORAL | Status: AC
  Administered 2024-03-14: 324 mg via ORAL
  Filled 2024-03-14: qty 4

## 2024-03-14 MED ORDER — VERAPAMIL HCL 2.5 MG/ML IV SOLN
INTRAVENOUS | Status: DC | PRN
  Administered 2024-03-14: 10 mL via INTRA_ARTERIAL

## 2024-03-14 MED ORDER — ENOXAPARIN SODIUM 40 MG/0.4ML IJ SOSY
40.0000 mg | PREFILLED_SYRINGE | INTRAMUSCULAR | Status: DC
  Administered 2024-03-15 – 2024-03-16 (×2): 40 mg via SUBCUTANEOUS
  Filled 2024-03-14 (×2): qty 0.4

## 2024-03-14 MED ORDER — HEPARIN SODIUM (PORCINE) 1000 UNIT/ML IJ SOLN
INTRAMUSCULAR | Status: DC | PRN
  Administered 2024-03-14: 4000 [IU] via INTRAVENOUS

## 2024-03-14 MED ORDER — ALBUTEROL SULFATE (2.5 MG/3ML) 0.083% IN NEBU: 2.5000 mg | INHALATION_SOLUTION | Freq: Four times a day (QID) | RESPIRATORY_TRACT | Status: DC | PRN

## 2024-03-14 MED ORDER — SUMATRIPTAN SUCCINATE 50 MG PO TABS
50.0000 mg | ORAL_TABLET | ORAL | Status: DC | PRN
  Administered 2024-03-14: 50 mg via ORAL
  Filled 2024-03-14 (×2): qty 1

## 2024-03-14 MED ORDER — POLYETHYLENE GLYCOL 3350 17 G PO PACK: 17.0000 g | PACK | Freq: Every day | ORAL | Status: DC | PRN

## 2024-03-14 MED ORDER — IOHEXOL 350 MG/ML SOLN
INTRAVENOUS | Status: DC | PRN
  Administered 2024-03-14: 51 mL via INTRA_ARTERIAL

## 2024-03-14 MED ORDER — ACETAMINOPHEN 650 MG RE SUPP: 650.0000 mg | Freq: Four times a day (QID) | RECTAL | Status: DC | PRN

## 2024-03-14 MED ORDER — METOPROLOL SUCCINATE ER 25 MG PO TB24
25.0000 mg | ORAL_TABLET | Freq: Every day | ORAL | Status: DC
  Administered 2024-03-14: 25 mg via ORAL
  Filled 2024-03-14 (×2): qty 1

## 2024-03-14 MED ORDER — HEPARIN SODIUM (PORCINE) 1000 UNIT/ML IJ SOLN
INTRAMUSCULAR | Status: AC
Start: 2024-03-14 — End: ?
  Filled 2024-03-14: qty 10

## 2024-03-14 MED ORDER — SUMATRIPTAN SUCCINATE 50 MG PO TABS: 50.0000 mg | ORAL_TABLET | Freq: Two times a day (BID) | ORAL | Status: DC | PRN

## 2024-03-14 MED ORDER — ONDANSETRON HCL 4 MG/2ML IJ SOLN
4.0000 mg | Freq: Four times a day (QID) | INTRAMUSCULAR | Status: DC | PRN
  Filled 2024-03-14: qty 2

## 2024-03-14 MED ORDER — MIDAZOLAM HCL 2 MG/2ML IJ SOLN
INTRAMUSCULAR | Status: AC
Start: 2024-03-14 — End: ?
  Filled 2024-03-14: qty 2

## 2024-03-14 MED ORDER — LOSARTAN POTASSIUM 25 MG PO TABS
25.0000 mg | ORAL_TABLET | Freq: Every day | ORAL | Status: DC
  Administered 2024-03-14: 25 mg via ORAL
  Filled 2024-03-14: qty 1

## 2024-03-14 MED ORDER — SODIUM CHLORIDE 0.9% FLUSH
3.0000 mL | Freq: Two times a day (BID) | INTRAVENOUS | Status: DC
  Administered 2024-03-14 – 2024-03-16 (×3): 3 mL via INTRAVENOUS

## 2024-03-14 MED ORDER — FENTANYL CITRATE (PF) 100 MCG/2ML IJ SOLN
INTRAMUSCULAR | Status: AC
  Filled 2024-03-14: qty 2

## 2024-03-14 MED ORDER — LABETALOL HCL 5 MG/ML IV SOLN: 10.0000 mg | INTRAVENOUS | Status: AC | PRN

## 2024-03-14 MED ORDER — SODIUM CHLORIDE 0.9 % IV SOLN: INTRAVENOUS | Status: AC

## 2024-03-14 MED ORDER — SODIUM CHLORIDE 0.9% FLUSH: 3.0000 mL | INTRAVENOUS | Status: DC | PRN

## 2024-03-14 MED ORDER — LOSARTAN POTASSIUM 25 MG PO TABS: 25.0000 mg | ORAL_TABLET | Freq: Every day | ORAL | Status: DC

## 2024-03-14 MED ORDER — HEPARIN (PORCINE) IN NACL 1000-0.9 UT/500ML-% IV SOLN
INTRAVENOUS | Status: DC | PRN
  Administered 2024-03-14: 1000 mL via INTRA_ARTERIAL

## 2024-03-14 SURGICAL SUPPLY — 7 items
CATH 5FR JL3.5 JR4 ANG PIG MP (CATHETERS) IMPLANT
DEVICE RAD COMP TR BAND LRG (VASCULAR PRODUCTS) IMPLANT
GLIDESHEATH SLEND SS 6F .021 (SHEATH) IMPLANT
GUIDEWIRE INQWIRE 1.5J.035X260 (WIRE) IMPLANT
PACK CARDIAC CATHETERIZATION (CUSTOM PROCEDURE TRAY) ×1 IMPLANT
SET ATX-X65L (MISCELLANEOUS) IMPLANT
TUBING CIL FLEX 10 FLL-RA (TUBING) IMPLANT

## 2024-03-14 NOTE — ED Notes (Signed)
 Patient is resting comfortably, family updated/in room

## 2024-03-14 NOTE — ED Notes (Signed)
 Spoke with [facility] who will be here in approx 1 hr

## 2024-03-14 NOTE — ED Provider Notes (Addendum)
 [place] EMERGENCY DEPARTMENT AT [facility] Provider Note   CSN: [id number] Arrival date & time: 03/14/24  1109     History  Chief Complaint  Patient presents with   Chest Pain    Pt arrives via EMS for episode of chest tightness and discomfort middle-right sided non-reproducible. Pt had episode after climbing the stairs and felt diaphoretic, and dizzy. Pt has had these episodes in the past, takes hydrochlorothiazide  in the morning and amlodipine  at night. States sob c episodes, but has asthma    Sherry Daniels is a 63 y.o. female.  Patient this morning between 9 and 10 and walking up the stairs got chest tightness sore to the center of the chest maybe a little bit towards the left side.  This has come and gone but never completely gone away she did get diaphoretic she did get nauseated.  Patient has not had a history of anything similar in the past.  Patient is currently being evaluated by rheumatology for possible lupus but no confirmation yet.  Past medical history significant hypertension hyperlipidemia asthma gastroesophageal reflux disease fibromyalgia.  Did have an overnight hospitalization in 2011 for chest pain.  But no acute findings.  Patient does have a history of fairly significant migraines.  And currently receiving treatment for that.  Past surgical history significant for abdominal hysterectomy.        Home Medications Prior to Admission medications   Medication Sig Start Date End Date Taking? Authorizing Provider  acetaminophen  (TYLENOL ) 500 MG tablet Take 1,500 mg by mouth every 6 (six) hours as needed. Reported on 05/01/2016    [provider]  albuterol  (PROVENTIL ) (2.5 MG/3ML) 0.083% nebulizer solution Take 3 mLs (2.5 mg total) by nebulization every 6 (six) hours as needed for wheezing or shortness of breath. 11/16/21   Wilhemena Harbour, NP  albuterol  (VENTOLIN  HFA) 108 (90 Base) MCG/ACT inhaler USE 2 PUFFS EVERY 4 TO 6 HOURS AS NEEDED  05/24/20   Austine Lefort, MD  amLODipine  (NORVASC ) 5 MG tablet TAKE 1.5 TABLETS (7.5 MG TOTAL) BY MOUTH DAILY. OFFICE VISIT NEEDED FOR ADDITIONAL REFILLS 06/04/23   Carie Charity, NP  aspirin  81 MG EC tablet 1 tablet po qOD Patient taking differently: Take 81 mg by mouth daily. 07/05/12   Tysinger, Christiane Cowing, PA-C  benzonatate  (TESSALON ) 200 MG capsule Take 1 capsule (200 mg total) by mouth 3 (three) times daily as needed for cough. Patient not taking: Reported on 02/25/2024 01/02/24   Paseda, Folashade R, FNP  estradiol (ESTRACE) 0.1 MG/GM vaginal cream Place 1 g vaginally 3 (three) times a week. 12/27/21   [provider]  estradiol (VIVELLE-DOT) 0.1 MG/24HR patch Place 1 patch onto the skin 2 (two) times a week. 11/15/23   [provider]  hydrochlorothiazide  (HYDRODIURIL ) 25 MG tablet Take 1 tablet (25 mg total) by mouth daily. 11/19/23 03/18/24  Carie Charity, NP  ibuprofen  (ADVIL ,MOTRIN ) 600 MG tablet Take 1 tablet (600 mg total) by mouth every 6 (six) hours as needed. 10/16/15   Kriss Peter, PA-C  methylPREDNISolone  (MEDROL  DOSEPAK) 4 MG TBPK tablet Please take as instructed on the packaging. Patient not taking: Reported on 02/25/2024 01/02/24   Paseda, Folashade R, FNP  Misc Natural Products (TART CHERRY ADVANCED PO) Take by mouth.    [provider]  Multiple Vitamins-Minerals (MULTIVITAMIN WITH MINERALS) tablet Take 1 tablet by mouth daily.    [provider]  Omega-3 1000 MG CAPS Take by mouth.  [provider]  promethazine  (PHENERGAN ) 12.5 MG tablet Take 1 tablet (12.5 mg total) by mouth every 8 (eight) hours as needed for nausea or vomiting. 09/30/21   Wilhemena Harbour, NP  SUMAtriptan  (IMITREX ) 50 MG tablet Take 1 tablet (50 mg total) by mouth 2 (two) times daily as needed for migraine. May repeat in 2 hours if headache persists or recurs. 01/23/24   Reddick, Johnathan B, NP  topiramate  (TOPAMAX ) 50 MG tablet Take 1 tablet (50 mg  total) by mouth 2 (two) times daily. Patient not taking: Reported on 02/25/2024 09/30/21   Wilhemena Harbour, NP  TURMERIC PO Take by mouth.    [provider]  Vitamin D , Ergocalciferol , (DRISDOL ) 1.25 MG (50000 UNIT) CAPS capsule Take 1 capsule (50,000 Units total) by mouth every 7 (seven) days. 02/27/24   Romayne Clubs, PA-C      Allergies    Aspirin , Codeine, Cymbalta  [duloxetine  hcl], and Simvastatin    Review of Systems   Review of Systems  Constitutional:  Positive for diaphoresis. Negative for chills and fever.  HENT:  Negative for ear pain and sore throat.   Eyes:  Negative for pain and visual disturbance.  Respiratory:  Negative for cough and shortness of breath.   Cardiovascular:  Positive for chest pain. Negative for palpitations.  Gastrointestinal:  Positive for nausea. Negative for abdominal pain and vomiting.  Genitourinary:  Negative for dysuria and hematuria.  Musculoskeletal:  Negative for arthralgias and back pain.  Skin:  Negative for color change and rash.  Neurological:  Negative for seizures and syncope.  All other systems reviewed and are negative.   Physical Exam Updated Vital Signs BP (!) 137/104   Pulse 70   Temp 98.4 F (36.9 C) (Oral)   Resp 16   Ht 1.676 m (5\' 6" )   Wt 76.7 kg Comment: Using weight from 4/14 admission  SpO2 99%   BMI 27.28 kg/m  Physical Exam Vitals and nursing note reviewed.  Constitutional:      General: She is not in acute distress.    Appearance: Normal appearance. She is well-developed.  HENT:     Head: Normocephalic and atraumatic.  Eyes:     Extraocular Movements: Extraocular movements intact.     Conjunctiva/sclera: Conjunctivae normal.     Pupils: Pupils are equal, round, and reactive to light.  Cardiovascular:     Rate and Rhythm: Normal rate and regular rhythm.     Heart sounds: No murmur heard. Pulmonary:     Effort: Pulmonary effort is normal. No respiratory distress.     Breath sounds: Normal  breath sounds.  Abdominal:     Palpations: Abdomen is soft.     Tenderness: There is no abdominal tenderness.  Musculoskeletal:        General: No swelling.     Cervical back: Normal range of motion and neck supple.  Skin:    General: Skin is warm and dry.     Capillary Refill: Capillary refill takes less than 2 seconds.  Neurological:     General: No focal deficit present.     Mental Status: She is alert and oriented to person, place, and time.  Psychiatric:        Mood and Affect: Mood normal.     ED Results / Procedures / Treatments   Labs (all labs ordered are listed, but only abnormal results are displayed) Labs Reviewed  BASIC METABOLIC PANEL WITH GFR - Abnormal; Notable for the following components:  Result Value   Glucose, Bld 156 (*)    All other components within normal limits  CBC - Abnormal; Notable for the following components:   WBC 3.0 (*)    All other components within normal limits  D-DIMER, QUANTITATIVE - Abnormal; Notable for the following components:   D-Dimer, Quant 0.66 (*)    All other components within normal limits  TROPONIN I (HIGH SENSITIVITY) - Abnormal; Notable for the following components:   Troponin I (High Sensitivity) 208 (*)    All other components within normal limits  HEPARIN  LEVEL (UNFRACTIONATED)  TROPONIN I (HIGH SENSITIVITY)    EKG EKG Interpretation Date/Time:  Friday Mar 14 2024 11:21:37 EDT Ventricular Rate:  71 PR Interval:  171 QRS Duration:  88 QT Interval:  409 QTC Calculation: 445 R Axis:   -2  Text Interpretation: Sinus rhythm Probable left atrial enlargement Probable anteroseptal infarct, old No significant change since last tracing Confirmed by Nielle Duford [phone]) on 03/14/2024 11:31:45 AM  Radiology No results found.  Procedures Procedures    Medications Ordered in ED Medications  nitroGLYCERIN  (NITROSTAT ) SL tablet 0.4 mg (0.4 mg Sublingual Given 03/14/24 1415)  heparin  ADULT infusion 100 units/mL  (25000 units/250mL) (900 Units/hr Intravenous New Bag/Given 03/14/24 1413)  aspirin  chewable tablet 324 mg (324 mg Oral Given 03/14/24 1339)  heparin  bolus via infusion 4,000 Units (4,000 Units Intravenous Bolus from Bag 03/14/24 1413)  acetaminophen  (TYLENOL ) tablet 1,000 mg (1,000 mg Oral Given 03/14/24 1418)    ED Course/ Medical Decision Making/ A&P                                 Medical Decision Making Amount and/or Complexity of Data Reviewed Labs: ordered. Radiology: ordered.  Risk OTC drugs. Prescription drug management. Decision regarding hospitalization.   Patient's basic metabolic panel normal renal function normal.  CBC white count down a little bit at 3 hemoglobin 12.5 platelets 222.  EKG without anything definitely acute.  Was just told that the initial troponin was 208.  Have ordered aspirin  for her.  Will order nitroglycerin .  Probably will need to start heparin .  D-dimer has been ordered.  Delta troponin will be significant.  Will discuss with cardiology.  CRITICAL CARE Performed by: Heidi Lemay Total critical care time: 45 minutes Critical care time was exclusive of separately billable procedures and treating other patients. Critical care was necessary to treat or prevent imminent or life-threatening deterioration. Critical care was time spent personally by me on the following activities: development of treatment plan with patient and/or surrogate as well as nursing, discussions with consultants, evaluation of patient's response to treatment, examination of patient, obtaining history from patient or surrogate, ordering and performing treatments and interventions, ordering and review of laboratory studies, ordering and review of radiographic studies, pulse oximetry and re-evaluation of patient's condition.   Discussed with on-call cardiology.  They want to see what the delta troponin is.  Also patient's D-dimer slightly elevated.  Will get CT angio chest just to rule  out PE because that can certainly elevate the troponins in the 200 range.  Delta troponin is in process.  Patient treated with heparin  aspirin  and sublingual nitroglycerin .   Final Clinical Impression(s) / ED Diagnoses Final diagnoses:  NSTEMI (non-ST elevated myocardial infarction) [facility])    Rx / DC Orders ED Discharge Orders     None         Nicklas Barns, MD 03/14/24 1322  Eutimio Gharibian, MD 03/14/24 [phone]

## 2024-03-14 NOTE — Assessment & Plan Note (Signed)
 Continuing home regimen of PPI therapy while hospitalized

## 2024-03-14 NOTE — ED Notes (Signed)
 Report given to Scheryl Cushing, RN at [employer]

## 2024-03-14 NOTE — Assessment & Plan Note (Signed)
 Serial troponins reveal significant upward trajectory and troponin elevation consistent with NSTEMI No dynamic ST segment changes Patient has been initiated heparin  infusion Due to patient having continued ongoing chest discomfort nitroglycerin  infusion will additionally be provided 325 mg of aspirin  administered Case discussed with cardiology, patient to undergo cardiac catheterization at [facility] this evening

## 2024-03-14 NOTE — Assessment & Plan Note (Signed)
 Resuming home antihypertensive regimen of amlodipine  As needed additional intravenous angiotensins for markedly elevated blood pressure.

## 2024-03-14 NOTE — Assessment & Plan Note (Signed)
 High intensity statin therapy will be initiated, note documented history of allergy to simvastatin Obtain lipid panel

## 2024-03-14 NOTE — H&P (Signed)
 History and Physical    Patient: Sherry Daniels MRN: [id number] DOA: 03/14/2024  Date of Service: the patient was seen and examined on 03/14/2024  Patient coming from: Home  Chief Complaint:  Chief Complaint  Patient presents with   Chest Pain    Pt arrives via EMS for episode of chest tightness and discomfort middle-right sided non-reproducible. Pt had episode after climbing the stairs and felt diaphoretic, and dizzy. Pt has had these episodes in the past, takes hydrochlorothiazide  in the morning and amlodipine  at night. States sob c episodes, but has asthma    HPI:   63 year old female with past medical history of hypertension, migraine headaches, gastroesophageal reflux disease, asthma, hyperlipidemia, Sjogren's disease and NAFLD who presents to [facility] department with complaints of chest discomfort.  Patient explains that when she woke up this morning "something did not feel right."  Patient states that she had less of an appetite than usual and did not eat much breakfast.  At approximately 1030 this morning she was walking up her stairs when she suddenly began to experience midsternal chest discomfort.  Patient describes this chest discomfort as tight in quality, moderate to severe in intensity and nonradiating.  This was associated with a sudden feeling of intense generalized malaise, lightheadedness and sudden onset of diaphoresis.  Patient's symptoms failed to dissipate with sitting down and resting.  The patient's sister called EMS who promptly came to evaluate the patient and brought her over to [facility] emergency department for prompt evaluation.  Upon evaluation in the emergency department patient was found to have an elevated troponin of 208 and in the setting of the patient's ongoing chest discomfort initiated her on a heparin  drip.  Patient was given a dose of sublingual nitroglycerin  with some improvement in her chest discomfort.  Case was  discussed with Neita Balo, PA for cardiology who initially stated that cardiology would consult and for hospital medicine to admit.  The hospitalist group was then called to assess the patient for admission to the hospital.  Second troponin revealed substantial elevation of 1380.  Cardiology was updated and recommended additional initiation of nitroglycerin  infusion for continued chest discomfort.  They also recommended transfer to [facility] in anticipation for the patient to undergo cardiac catheterization this evening.   Review of Systems: Review of Systems  Constitutional:  Positive for diaphoresis and malaise/fatigue.  Cardiovascular:  Positive for chest pain.     Past Medical History:  Diagnosis Date   Allergy    Asthma    Chest pain 11/2009   overnight hospitalization   Constipation    Dr. Tova Fresh   Fibromyalgia    GERD (gastroesophageal reflux disease)    Hyperlipidemia    Hypertension    Migraine    Nonalcoholic fatty liver disease     Past Surgical History:  Procedure Laterality Date   ABDOMINAL HYSTERECTOMY     total   COLONOSCOPY  09/15/2009   Dr. Tova Fresh; normal   WISDOM TOOTH EXTRACTION      Social History:  reports that she has never smoked. She has never been exposed to tobacco smoke. She has never used smokeless tobacco. She reports that she does not drink alcohol and does not use drugs.  Allergies  Allergen Reactions   Aspirin  Nausea Only   Codeine Nausea Only   Cymbalta  [Duloxetine  Hcl]     Felt like it worsened anxiety and fibromyalgia   Simvastatin Other (See Comments)    dizziness  Family History  Problem Relation Age of Onset   Cancer Mother        stage III, colon   Hypertension Mother    Dementia Mother    Heart disease Mother        bradycardia   Stroke Mother    Cancer Father        prostate   Hypertension Father    Hyperlipidemia Father    Heart disease Father 84       MI    Hyperlipidemia Sister    Hypertension  Sister    Fibromyalgia Sister    Arthritis Brother        RA   Hypertension Brother    Hyperlipidemia Brother    Fibromyalgia Brother    Hypertension Brother    Hyperlipidemia Brother    Healthy Daughter    Healthy Son    Lupus Niece     Prior to Admission medications   Medication Sig Start Date End Date Taking? Authorizing Provider  acetaminophen  (TYLENOL ) 500 MG tablet Take 1,500 mg by mouth every 6 (six) hours as needed. Reported on 05/01/2016    [provider]  albuterol  (PROVENTIL ) (2.5 MG/3ML) 0.083% nebulizer solution Take 3 mLs (2.5 mg total) by nebulization every 6 (six) hours as needed for wheezing or shortness of breath. 11/16/21   Wilhemena Harbour, NP  albuterol  (VENTOLIN  HFA) 108 (90 Base) MCG/ACT inhaler USE 2 PUFFS EVERY 4 TO 6 HOURS AS NEEDED 05/24/20   Austine Lefort, MD  amLODipine  (NORVASC ) 5 MG tablet TAKE 1.5 TABLETS (7.5 MG TOTAL) BY MOUTH DAILY. OFFICE VISIT NEEDED FOR ADDITIONAL REFILLS 06/04/23   Carie Charity, NP  aspirin  81 MG EC tablet 1 tablet po qOD Patient taking differently: Take 81 mg by mouth daily. 07/05/12   Tysinger, Christiane Cowing, PA-C  benzonatate  (TESSALON ) 200 MG capsule Take 1 capsule (200 mg total) by mouth 3 (three) times daily as needed for cough. Patient not taking: Reported on 02/25/2024 01/02/24   Paseda, Folashade R, FNP  estradiol (ESTRACE) 0.1 MG/GM vaginal cream Place 1 g vaginally 3 (three) times a week. 12/27/21   [provider]  estradiol (VIVELLE-DOT) 0.1 MG/24HR patch Place 1 patch onto the skin 2 (two) times a week. 11/15/23   [provider]  hydrochlorothiazide  (HYDRODIURIL ) 25 MG tablet Take 1 tablet (25 mg total) by mouth daily. 11/19/23 03/18/24  Carie Charity, NP  ibuprofen  (ADVIL ,MOTRIN ) 600 MG tablet Take 1 tablet (600 mg total) by mouth every 6 (six) hours as needed. 10/16/15   Kriss Peter, PA-C  methylPREDNISolone  (MEDROL  DOSEPAK) 4 MG TBPK tablet Please take as instructed on the  packaging. Patient not taking: Reported on 02/25/2024 01/02/24   Paseda, Folashade R, FNP  Misc Natural Products (TART CHERRY ADVANCED PO) Take by mouth.    [provider]  Multiple Vitamins-Minerals (MULTIVITAMIN WITH MINERALS) tablet Take 1 tablet by mouth daily.    [provider]  Omega-3 1000 MG CAPS Take by mouth.    [provider]  promethazine  (PHENERGAN ) 12.5 MG tablet Take 1 tablet (12.5 mg total) by mouth every 8 (eight) hours as needed for nausea or vomiting. 09/30/21   Wilhemena Harbour, NP  SUMAtriptan  (IMITREX ) 50 MG tablet Take 1 tablet (50 mg total) by mouth 2 (two) times daily as needed for migraine. May repeat in 2 hours if headache persists or recurs. 01/23/24   Reddick, Johnathan B, NP  topiramate  (TOPAMAX ) 50 MG tablet Take 1 tablet (  50 mg total) by mouth 2 (two) times daily. Patient not taking: Reported on 02/25/2024 09/30/21   Wilhemena Harbour, NP  TURMERIC PO Take by mouth.    [provider]  Vitamin D , Ergocalciferol , (DRISDOL ) 1.25 MG (50000 UNIT) CAPS capsule Take 1 capsule (50,000 Units total) by mouth every 7 (seven) days. 02/27/24   Romayne Clubs, PA-C    Physical Exam:  Vitals:   03/14/24 1410 03/14/24 1415 03/14/24 1429 03/14/24 1430  BP: (!) 153/92 (!) 137/104  (!) 151/97  Pulse: 83 70  75  Resp: 17 16  18   Temp:   98.4 F (36.9 C)   TempSrc:   Oral   SpO2: 98% 99%  99%  Weight:      Height:        Constitutional: Awake alert and oriented x3, patient is in mild distress due to ongoing chest discomfort. Skin: no rashes, no lesions, good skin turgor noted. Eyes: Pupils are equally reactive to light.  No evidence of scleral icterus or conjunctival pallor.  ENMT: Moist mucous membranes noted.  Posterior pharynx clear of any exudate or lesions.   Neck: normal, supple, no masses, no thyromegaly.  No evidence of jugular venous distension.   Respiratory: clear to auscultation bilaterally, no wheezing, no crackles.  Normal respiratory effort. No accessory muscle use.  Cardiovascular: Regular rate and rhythm, no murmurs / rubs / gallops. No extremity edema. 2+ pedal pulses. No carotid bruits.  Chest:   Nontender without crepitus or deformity.   Back:   Nontender without crepitus or deformity. Abdomen: Abdomen is soft and nontender.  No evidence of intra-abdominal masses.  Positive bowel sounds noted in all quadrants.   Musculoskeletal: No joint deformity upper and lower extremities. Good ROM, no contractures. Normal muscle tone.  Neurologic: CN 2-12 grossly intact. Sensation intact.  Patient moving all 4 extremities spontaneously.  Patient is following all commands.  Patient is responsive to verbal stimuli.   Psychiatric: Patient is exhibiting anxious mood with appropriate affect.  Patient seems to possess insight as to their current situation.    Data Reviewed:  I have personally reviewed and interpreted labs, imaging.  Significant findings are   Lab Results  Component Value Date   WBC 3.0 (L) 03/14/2024   HGB 12.5 03/14/2024   HCT 38.9 03/14/2024   MCV 92.0 03/14/2024   PLT 222 03/14/2024   Lab Results  Component Value Date   K 3.7 03/14/2024   Lab Results  Component Value Date   BUN 13 03/14/2024   Lab Results  Component Value Date   CREATININE 0.70 03/14/2024    CXR:   Chest X-ray was personally reviewed.  No evidence of focal infiltrates.  No evidence of pleural effusion.  No evidence of pneumothorax.   CTA Chest: Negative for pulmonary embolism.  Some scant inflammation near the pancreas.   EKG: Personally reviewed.  Rhythm is normal sinus rhythm with heart rate of 71 bpm.  No dynamic ST segment changes appreciated.   Assessment & Plan NSTEMI (non-ST elevated myocardial infarction) (HCC) Serial troponins reveal significant upward trajectory and troponin elevation consistent with NSTEMI No dynamic ST segment changes Patient has been initiated heparin  infusion Due to patient  having continued ongoing chest discomfort nitroglycerin  infusion will additionally be provided 325 mg of aspirin  administered Case discussed with cardiology, patient to undergo cardiac catheterization at [facility] this evening Essential hypertension Resuming home antihypertensive regimen of amlodipine  As needed additional intravenous angiotensins for markedly elevated blood  pressure. Mixed hyperlipidemia High intensity statin therapy will be initiated, note documented history of allergy to simvastatin Obtain lipid panel GERD without esophagitis Continuing home regimen of PPI therapy while hospitalized Mild intermittent asthma, uncomplicated No evidence of acute asthma exacerbation As needed bronchodilator therapy for shortness of breath and wheezing.  Migraines Continue home regimen of as needed triptan therapy for bouts of migraine headaches    Code Status:  Full code  code status decision has been confirmed with: patient Family Communication: sister at the bedside who has been updated on plan of care   Consults: Case discussed with Neita Balo with Cardiology  Severity of Illness:  The appropriate patient status for this patient is INPATIENT. Inpatient status is judged to be reasonable and necessary in order to provide the required intensity of service to ensure the patient's safety. The patient's presenting symptoms, physical exam findings, and initial radiographic and laboratory data in the context of their chronic comorbidities is felt to place them at high risk for further clinical deterioration. Furthermore, it is not anticipated that the patient will be medically stable for discharge from the hospital within 2 midnights of admission.   * I certify that at the point of admission it is my clinical judgment that the patient will require inpatient hospital care spanning beyond 2 midnights from the point of admission due to high intensity of service, high risk for further  deterioration and high frequency of surveillance required.*  Author:  True Fuss MD  03/14/2024 4:14 PM

## 2024-03-14 NOTE — ED Triage Notes (Addendum)
 Pt bib ems from home for chest tightness by the center of the sternum. Feels like a "ball of pressure that tightens". Pt feels nauseous and clammy when the tightness intensifies.  160/88 70s hr 107 cbg 18 rr  18g LAC

## 2024-03-14 NOTE — ED Notes (Signed)
 Pt helped to bathroom using wheelchair, was unsteady/dizzy upon standing

## 2024-03-14 NOTE — Assessment & Plan Note (Signed)
?   No evidence of acute asthma exacerbation ?? As needed bronchodilator therapy for shortness of breath and wheezing. ? ?

## 2024-03-14 NOTE — Consult Note (Addendum)
 Cardiology Consultation   Patient ID: Sherry Daniels MRN: [id number]; DOB: 1961/06/29  Admit date: 03/14/2024 Date of Consult: 03/14/2024  PCP:  Paseda, Folashade R, FNP   [facility] Providers Cardiologist:  Oneil Bigness, MD      Patient Profile:   Sherry Daniels is a 63 y.o. female with a hx of hypertension, asthma, fibromyalgia, GERD, hyperlipidemia, nonalcoholic fatty liver disease who is being seen 03/14/2024 for the evaluation of NSTEMI at the request of Dr. Lanette Pipe.  History of Present Illness:   Sherry Daniels is a 63 year old female with above medical history.  She is followed by Dr. Lucy Sack as an outpatient.  First establish care with cardiology in 08/2021 for evaluation of chest tightness.  She underwent coronary CTA on 09/16/2021 that showed a coronary calcium  score of 0.  A cardiac monitor in 09/2021 showed no arrhythmias and rare ectopy.  Patient presented to the ED on 5/2 complaining of chest tightness in the center of the sternum.  Reported feeling like a "ball of pressure at that tightened".  Also reported having nausea and feeling clammy.  Initial BP 160/88, heart rate in the 70s.  In the ED, high-sensitivity troponin 208> 1380.  Creatinine 0.7, potassium 3.7, WBC 3.0, hemoglobin 12.5.  D-dimer 0.66.  Underwent chest CTA that showed no acute intrathoracic pathology, no evidence of PE, mild stranding adjacent to the head and uncinate process of the pancreas.   Patient was given aspirin  324 mg in the ED and started on IV heparin .  Was transferred to [facility] for cardiac catheterization for treatment of NSTEMI   Past Medical History:  Diagnosis Date   Allergy    Asthma    Chest pain 11/2009   overnight hospitalization   Constipation    Dr. Tova Fresh   Fibromyalgia    GERD (gastroesophageal reflux disease)    Hyperlipidemia    Hypertension    Migraine    Nonalcoholic fatty liver disease     Past Surgical History:  Procedure  Laterality Date   ABDOMINAL HYSTERECTOMY     total   COLONOSCOPY  09/15/2009   Dr. Tova Fresh; normal   WISDOM TOOTH EXTRACTION       Inpatient Medications: Scheduled Meds:  Continuous Infusions:  heparin  900 Units/hr (03/14/24 1413)   nitroGLYCERIN      PRN Meds: nitroGLYCERIN   Allergies:    Allergies  Allergen Reactions   Aspirin  Nausea Only   Codeine Nausea Only   Cymbalta  [Duloxetine  Hcl]     Felt like it worsened anxiety and fibromyalgia   Simvastatin Other (See Comments)    dizziness    Social History:   Social History   Socioeconomic History   Marital status: Married    Spouse name: Not on file   Number of children: 2   Years of education: Not on file   Highest education level: Not on file  Occupational History   Occupation: supervise principles in school system    Employer: [employer]  Tobacco Use   Smoking status: Never    Passive exposure: Never   Smokeless tobacco: Never  Vaping Use   Vaping status: Never Used  Substance and Sexual Activity   Alcohol use: No    Alcohol/week: 0.0 standard drinks of alcohol   Drug use: No   Sexual activity: Not on file  Other Topics Concern   Not on file  Social History Narrative   Married, 1 daughter and 1 son, pentecostal, exercise most days, walking, biking,  running.    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 [employer]   Social Drivers of Corporate investment banker Strain: Not on file  Food Insecurity: Not on file  Transportation Needs: Not on file  Physical Activity: Not on file  Stress: Not on file  Social Connections: Not on file  Intimate Partner Violence: Not on file    Family History:    Family History  Problem Relation Age of Onset   Cancer Mother        stage III, colon   Hypertension Mother    Dementia Mother    Heart disease Mother        bradycardia   Stroke Mother    Cancer Father        prostate   Hypertension Father    Hyperlipidemia Father    Heart disease Father 38       MI     Hyperlipidemia Sister    Hypertension Sister    Fibromyalgia Sister    Arthritis Brother        RA   Hypertension Brother    Hyperlipidemia Brother    Fibromyalgia Brother    Hypertension Brother    Hyperlipidemia Brother    Healthy Daughter    Healthy Son    Lupus Niece      ROS:  Please see the history of present illness.   All other ROS reviewed and negative.     Physical Exam/Data:   Vitals:   03/14/24 1410 03/14/24 1415 03/14/24 1429 03/14/24 1430  BP: (!) 153/92 (!) 137/104  (!) 151/97  Pulse: 83 70  75  Resp: 17 16  18   Temp:   98.4 F (36.9 C)   TempSrc:   Oral   SpO2: 98% 99%  99%  Weight:      Height:       No intake or output data in the 24 hours ending 03/14/24 1618    03/14/2024    1:18 PM 02/25/2024   12:48 PM 01/08/2024    8:03 AM  Last 3 Weights  Weight (lbs) 169 lb 169 lb 3.2 oz 165 lb 9.6 oz  Weight (kg) 76.658 kg 76.749 kg 75.116 kg     Body mass index is 27.28 kg/m.  General:  Well nourished, well developed, in no acute distress HEENT: normal Neck: no JVD Vascular: Radial pulses 2+ bilaterally Cardiac:  normal S1, S2; RRR; no murmur   Lungs:  clear to auscultation bilaterally, no wheezing, rhonchi or rales  Abd: soft, nontender  Ext: no edema in BLE  Musculoskeletal:  No deformities, BUE and BLE strength normal and equal Skin: warm and dry  Neuro:  no focal abnormalities noted Psych:  Normal affect    Relevant CV Studies:  Cardiac Studies & Procedures   ______________________________________________________________________________________________        Carles Cheadle  LONG TERM MONITOR (3-14 DAYS) 09/27/2021  Narrative Enrollment 09/09/2021-09/14/2021 (5 days 11 hours). Patient had a min HR of 52 bpm (sinus bradycardia), max HR of 152 bpm (sinus tachycardia), and avg HR of 74 bpm (normal sinus rhythm). Predominant underlying rhythm was Sinus Rhythm. Isolated SVEs were rare (<1.0%), and no SVE Couplets or SVE Triplets were present.  Isolated VEs were rare (<1.0%), VE Couplets were rare (<1.0%), and no VE Triplets were present.  09/09/21 08:40pm Light headed,Light sweating coincided with normal sinus rhythm 68 bpm. 09/10/21 02:25am Chest pain or pressure,Dizziness,Light headed coincided with normal sinus rhythm 68 bpm. 09/10/21 05:05am Fluttering or racing,Light headed,Sweating coincided  with normal sinus rhythm 66 bpm. 09/10/21 11:30am Anxious,Chest pain or pressure,Light headed,Mild chest pressure coincided with normal sinus rhythm 72 bpm. 09/11/21 09:07pm Short of breath coincided with normal sinus rhythm 76 bpm.  Impression: 1. No arrhythmias detected. 2. Rare ectopy.  Sherry Spruce T. Rolm Clos, MD, [facility]  [facility] [address], [address] [place], [place] [address] [phone] 7:25 PM   CT SCANS  CT CORONARY MORPH W/CTA COR W/SCORE 09/16/2021  Addendum 09/16/2021 11:10 AM ADDENDUM REPORT: 09/16/2021 11:07  CLINICAL DATA:  Chest pain  EXAM: Cardiac CTA  MEDICATIONS: Sub lingual nitro. 4 mg and lopressor  100mg   TECHNIQUE: The patient was scanned on a [employer] 192 scanner. Gantry rotation speed was 250 msecs. Collimation was. 6 mm . A 120 kV prospective scan was triggered in the ascending thoracic aorta at 140 HU's with full mA between 30-70% of the R-R interval . Average HR during the scan was 47 bpm. The 3D data set was interpreted on a dedicated work station using MPR, MIP and VRT modes. A total of 80 cc of contrast was used.  FINDINGS: Non-cardiac: See separate report from [facility]. No significant findings on limited lung and soft tissue windows.  Calcium  score: No calcium  noted  Coronary Arteries: Right dominant with no anomalies  LM: Normal  LAD: Normal  D1: Normal  D2: Normal  Circumflex: Normal  OM1: Normal  OM2: Normal  RCA: Normal  PDA: Normal  PLA: Normal  IMPRESSION: 1.  Calcium  score 0  2.  Normal ascending thoracic aorta 3.1  cm  3.  Normal right dominant coronary arteries  Sherry Daniels   Electronically Signed By: Sherry Daniels M.D. On: 09/16/2021 11:07  Narrative EXAM: OVER-READ INTERPRETATION  CT CHEST  The following report is an over-read performed by radiologist Dr. Alexandria Daniels of [facility] on 09/16/2021. This over-read does not include interpretation of cardiac or coronary anatomy or pathology. The coronary calcium  score/coronary CTA interpretation by the cardiologist is attached.  COMPARISON:  None.  FINDINGS: Within the visualized portions of the thorax there are no suspicious appearing pulmonary nodules or masses, there is no acute consolidative airspace disease, no pleural effusions, no pneumothorax and no lymphadenopathy. Visualized portions of the upper abdomen are unremarkable. There are no aggressive appearing lytic or blastic lesions noted in the visualized portions of the skeleton.  IMPRESSION: 1. No significant incidental noncardiac findings are noted.  Electronically Signed: By: Sherry Daniels M.D. On: 09/16/2021 10:11     ______________________________________________________________________________________________       Laboratory Data:  High Sensitivity Troponin:   Recent Labs  Lab 03/14/24 1153 03/14/24 1414  TROPONINIHS 208* 1,380*     Chemistry Recent Labs  Lab 03/14/24 1153  NA 138  K 3.7  CL 104  CO2 26  GLUCOSE 156*  BUN 13  CREATININE 0.70  CALCIUM  9.2  GFRNONAA >60  ANIONGAP 8    No results for input(s): "PROT", "ALBUMIN", "AST", "ALT", "ALKPHOS", "BILITOT" in the last 168 hours. Lipids No results for input(s): "CHOL", "TRIG", "HDL", "LABVLDL", "LDLCALC", "CHOLHDL" in the last 168 hours.  Hematology Recent Labs  Lab 03/14/24 1153  WBC 3.0*  RBC 4.23  HGB 12.5  HCT 38.9  MCV 92.0  MCH 29.6  MCHC 32.1  RDW 13.5  PLT 222   Thyroid No results for input(s): "TSH", "FREET4" in the last 168 hours.  BNPNo results  for input(s): "BNP", "PROBNP" in the last 168 hours.  DDimer  Recent Labs  Lab 03/14/24 1414  DDIMER 0.66*     Radiology/Studies:  CT Angio Chest PE W/Cm &/Or Wo Cm Result Date: 03/14/2024 CLINICAL DATA:  Concern for pulmonary embolism. EXAM: CT ANGIOGRAPHY CHEST WITH CONTRAST TECHNIQUE: Multidetector CT imaging of the chest was performed using the standard protocol during bolus administration of intravenous contrast. Multiplanar CT image reconstructions and MIPs were obtained to evaluate the vascular anatomy. RADIATION DOSE REDUCTION: This exam was performed according to the departmental dose-optimization program which includes automated exposure control, adjustment of the mA and/or kV according to patient size and/or use of iterative reconstruction technique. CONTRAST:  75mL OMNIPAQUE  IOHEXOL  350 MG/ML SOLN COMPARISON:  Chest CT dated 03/14/2024. FINDINGS: Cardiovascular: There is no cardiomegaly or pericardial effusion. The thoracic aorta is unremarkable. No pulmonary artery embolus identified. Mediastinum/Nodes: No hilar or mediastinal adenopathy. The esophagus is grossly unremarkable. No mediastinal fluid collection. Lungs/Pleura: No focal consolidation, pleural effusion, pneumothorax. The central airways are patent. Upper Abdomen: Mild stranding adjacent to the head and uncinate process of the pancreas. Correlation with pancreatic enzymes recommended to exclude pancreatitis. Musculoskeletal: No acute osseous pathology. Review of the MIP images confirms the above findings. IMPRESSION: 1. No acute intrathoracic pathology. No CT evidence of pulmonary embolism. 2. Mild stranding adjacent to the head and uncinate process of the pancreas. Correlation with pancreatic enzymes recommended to exclude pancreatitis. Electronically Signed   By: Sherry Daniels M.D.   On: 03/14/2024 15:33   DG Chest 2 View Result Date: 03/14/2024 CLINICAL DATA:  chest pain EXAM: CHEST - 2 VIEW COMPARISON:  July 23, 2023  FINDINGS: No focal airspace consolidation, pleural effusion, or pneumothorax. No cardiomegaly. No acute fracture or destructive lesion. IMPRESSION: No acute cardiopulmonary abnormality. Electronically Signed   By: Sherry Daniels M.D.   On: 03/14/2024 14:56     Assessment and Plan:   NSTEMI - Patient previously had coronary CTA in 09/2021 that showed a coronary calcium  score of 0. - She presented to the ED at [facility] complaining of chest tightness.  High-sensitivity troponin elevated 208>1380  - CTA chest without evidence of PE - EKG showed NSR, no acute ischemic changes -Patient started on IV heparin  and given 324 mg aspirin  in the ED. she continues to have chest discomfort -With her significant delta troponin and ongoing chest discomfort, patient was transferred to [facility] for cardiac catheterization - Prior to admission patient was on amlodipine , aspirin  81 mg daily.  Further medication adjustments to be made pending cath results - Ordered lipid panel, LP(a), A1c to be collected in AM   Hypertension - Patient hypertensive in the ED, predominantly in the 140s-150s systolic.  Did have 1 reading up to 200 systolic - Continue amlodipine  7.5 mg daily - As above, medication adjustments will be made post-cath  Risk Assessment/Risk Scores:  [id number]}   TIMI Risk Score for Unstable Angina or Non-ST Elevation MI:   The patient's TIMI risk score is 2, which indicates a 8% risk of all cause mortality, new or recurrent myocardial infarction or need for urgent revascularization in the next 14 days.{   For questions or updates, please contact [facility] Please consult [email or web address] for contact info under    Signed, Debria Fang, PA-C  03/14/2024 4:18 PM   Patient seen, examined. Available data reviewed. Agree with findings, assessment, and plan as outlined by Duwayne Ginsberg, PA-C.  I evaluated the patient in the ambulance bay and walked her up to the cardiac catheterization  lab directly.  She is alert, oriented,  in no distress.  She is complaining of mild 2/10 ongoing chest discomfort.  HEENT is normal, JVP is normal, carotid upstrokes normal without bruits, lungs are clear to auscultation bilaterally, heart is regular rate and rhythm with no murmur gallop, abdomen is soft and nontender with no masses, extremities have no edema, skin is warm and dry with no rash.  EKG shows normal sinus rhythm with probable old anteroseptal infarct.  No acute ST/T wave changes.  High-sensitivity troponin is elevated at 208 with a second delta troponin of 1380.  In summary, the patient presents with non-STEMI and ongoing mild chest discomfort.  We have recommended urgent cardiac catheterization with possible PCI.  She has had a coronary CTA in 2022 that showed no evidence of coronary artery disease and a calcium  score of 0.  It is possible that she may have a nonatherosclerotic cause of non-STEMI such as spontaneous coronary artery dissection, MINOCA, or Takotsubo. I have reviewed the risks, indications, and alternatives to cardiac catheterization, possible angioplasty, and stenting with the patient. Risks include but are not limited to bleeding, infection, vascular injury, stroke, myocardial infection, arrhythmia, kidney injury, radiation-related injury in the case of prolonged fluoroscopy use, emergency cardiac surgery, and death. The patient understands the risks of serious complication is 1-2 in 1000 with diagnostic cardiac cath and 1-2% or less with angioplasty/stenting.  Further plans/disposition pending her cardiac catheterization result.  Otherwise, as outlined above.  As part of her evaluation today, I reviewed her CTA performed this afternoon and it showed no evidence of acute aortic pathology.  Arnoldo Lapping, M.D. 03/14/2024 4:52 PM

## 2024-03-14 NOTE — Assessment & Plan Note (Signed)
 Continue home regimen of as needed triptan therapy for bouts of migraine headaches

## 2024-03-14 NOTE — Hospital Course (Addendum)
 63 year old female with past medical history of hypertension, migraine headaches, gastroesophageal reflux disease, asthma, hyperlipidemia, Sjogren's disease and NAFLD who presents to [facility] with complaints of chest discomfort.  Patient explains that when she woke up this morning "something did not feel right."  Patient states that she had less of an appetite than usual and did not eat much breakfast.  At approximately 1030 this morning she was walking up her stairs when she suddenly began to experience midsternal chest discomfort.  Patient describes this chest discomfort as tight in quality, moderate to severe in intensity and nonradiating.  This was associated with a sudden feeling of intense generalized malaise, lightheadedness and sudden onset of diaphoresis.  Patient's symptoms failed to dissipate with sitting down and resting.  The patient's sister called EMS who promptly came to evaluate the patient and brought her over to [facility] emergency department for prompt evaluation.  Upon evaluation in the emergency department patient was found to have an elevated troponin of 208 and in the setting of the patient's ongoing chest discomfort initiated her on a heparin  drip.  Patient was given a dose of sublingual nitroglycerin  with some improvement in her chest discomfort.  Case was discussed with Neita Balo, PA for cardiology who initially stated that cardiology would consult and for hospital medicine to admit.  The hospitalist group was then called to assess the patient for admission to the hospital.  Second troponin revealed substantial elevation of 1380.  Cardiology was updated and recommended additional initiation of nitroglycerin  infusion for continued chest discomfort.  They also recommended transfer to [facility] in anticipation for the patient to undergo cardiac catheterization this evening.

## 2024-03-14 NOTE — Progress Notes (Signed)
 PHARMACY - ANTICOAGULATION CONSULT NOTE  Pharmacy Consult for heparin  Indication: chest pain/ACS  Allergies  Allergen Reactions   Aspirin  Nausea Only   Codeine Nausea Only   Cymbalta  [Duloxetine  Hcl]     Felt like it worsened anxiety and fibromyalgia   Simvastatin Other (See Comments)    dizziness    Patient Measurements: Height: 5\' 6"  (167.6 cm) Weight: 76.7 kg (169 lb) (Using weight from 4/14 admission) IBW/kg (Calculated) : 59.3 HEPARIN  DW (KG): 74.9  Vital Signs: Temp: 98.4 F (36.9 C) (05/02 1122) Temp Source: Oral (05/02 1122) BP: 185/107 (05/02 1215) Pulse Rate: 70 (05/02 1215)  Labs: Recent Labs    03/14/24 1153  HGB 12.5  HCT 38.9  PLT 222  CREATININE 0.70  TROPONINIHS 208*    Estimated Creatinine Clearance: 78.3 mL/min (by C-G formula based on SCr of 0.7 mg/dL).   Medical History: Past Medical History:  Diagnosis Date   Allergy    Asthma    Chest pain 11/2009   overnight hospitalization   Constipation    Dr. Tova Fresh   Fibromyalgia    GERD (gastroesophageal reflux disease)    Hyperlipidemia    Hypertension    Migraine    Nonalcoholic fatty liver disease     Medications:  Scheduled:    Assessment: 63 YO female presenting with chest tightness and associating diaphoresis/nausea. Not on anticoagulation PTA. Per ED provider, EKG without any definite acute changes. Pharmacy consulted for heparin  dosing.  Today, 03/14/24: Trops elevated at 208, repeat pending collection Hgb 12.5, plts 222--stable Scr 0.70--stable Most recent weight 169 lbs on 02/25/24 No s/sx of bleeding reported   Goal of Therapy:  Heparin  level 0.3-0.7 units/ml Monitor platelets by anticoagulation protocol: Yes   Plan:  Give heparin  4000 units bolus x 1 Start heparin  infusion at 900 units/hr Check anti-Xa level 6hrs after infusion starts and daily while on heparin  Continue to monitor H&H and platelets   Roselee Cong, PharmD Clinical Pharmacist  5/2/20251:44 PM

## 2024-03-15 ENCOUNTER — Inpatient Hospital Stay (HOSPITAL_COMMUNITY)

## 2024-03-15 ENCOUNTER — Encounter (HOSPITAL_COMMUNITY): Payer: Self-pay | Admitting: Internal Medicine

## 2024-03-15 DIAGNOSIS — I5181 Takotsubo syndrome: Secondary | ICD-10-CM | POA: Diagnosis not present

## 2024-03-15 DIAGNOSIS — I214 Non-ST elevation (NSTEMI) myocardial infarction: Secondary | ICD-10-CM | POA: Diagnosis not present

## 2024-03-15 DIAGNOSIS — R079 Chest pain, unspecified: Secondary | ICD-10-CM | POA: Diagnosis not present

## 2024-03-15 LAB — COMPREHENSIVE METABOLIC PANEL WITH GFR
ALT: 38 U/L (ref 0–44)
AST: 42 U/L — ABNORMAL HIGH (ref 15–41)
Albumin: 3.2 g/dL — ABNORMAL LOW (ref 3.5–5.0)
Alkaline Phosphatase: 57 U/L (ref 38–126)
Anion gap: 7 (ref 5–15)
BUN: 7 mg/dL (ref 6–20)
CO2: 26 mmol/L (ref 22–32)
Calcium: 8.9 mg/dL (ref 8.9–10.3)
Chloride: 104 mmol/L (ref 98–111)
Creatinine, Ser: 0.69 mg/dL (ref 0.44–1.00)
GFR, Estimated: >60 mL/min (ref >60)
Glucose, Bld: 113 mg/dL — ABNORMAL HIGH (ref 70–99)
Potassium: 3.4 mmol/L — ABNORMAL LOW (ref 3.5–5.1)
Sodium: 137 mmol/L (ref 135–145)
Total Bilirubin: 0.8 mg/dL (ref 0.0–1.2)
Total Protein: 7 g/dL (ref 6.5–8.1)

## 2024-03-15 LAB — HEMOGLOBIN A1C
Hgb A1c MFr Bld: 5.7 % — ABNORMAL HIGH (ref 4.8–5.6)
Mean Plasma Glucose: 116.89 mg/dL

## 2024-03-15 LAB — MAGNESIUM: Magnesium: 2 mg/dL (ref 1.7–2.4)

## 2024-03-15 LAB — LIPID PANEL
Cholesterol: 256 mg/dL — ABNORMAL HIGH (ref 0–200)
HDL: 87 mg/dL (ref >40)
LDL Cholesterol: 156 mg/dL — ABNORMAL HIGH (ref 0–99)
Total CHOL/HDL Ratio: 2.9 ratio
Triglycerides: 67 mg/dL (ref <150)
VLDL: 13 mg/dL (ref 0–40)

## 2024-03-15 LAB — CBC WITH DIFFERENTIAL/PLATELET
Abs Immature Granulocytes: 0.01 10*3/uL (ref 0.00–0.07)
Basophils Absolute: 0 10*3/uL (ref 0.0–0.1)
Basophils Relative: 1 %
Eosinophils Absolute: 0.1 10*3/uL (ref 0.0–0.5)
Eosinophils Relative: 1 %
HCT: 37.4 % (ref 36.0–46.0)
Hemoglobin: 12.3 g/dL (ref 12.0–15.0)
Immature Granulocytes: 0 %
Lymphocytes Relative: 39 %
Lymphs Abs: 1.8 10*3/uL (ref 0.7–4.0)
MCH: 29.4 pg (ref 26.0–34.0)
MCHC: 32.9 g/dL (ref 30.0–36.0)
MCV: 89.3 fL (ref 80.0–100.0)
Monocytes Absolute: 0.3 10*3/uL (ref 0.1–1.0)
Monocytes Relative: 7 %
Neutro Abs: 2.3 10*3/uL (ref 1.7–7.7)
Neutrophils Relative %: 52 %
Platelets: 238 10*3/uL (ref 150–400)
RBC: 4.19 MIL/uL (ref 3.87–5.11)
RDW: 13.4 % (ref 11.5–15.5)
WBC: 4.5 10*3/uL (ref 4.0–10.5)
nRBC: 0 % (ref 0.0–0.2)

## 2024-03-15 LAB — ECHOCARDIOGRAM COMPLETE
Area-P 1/2: 3.91 cm2
Height: 66 in
MV M vel: 4.27 m/s
MV Peak grad: 72.9 mmHg
S' Lateral: 2.5 cm
Single Plane A4C EF: 52 %
Weight: 2704 [oz_av]

## 2024-03-15 LAB — SEDIMENTATION RATE: Sed Rate: 20 mm/h (ref 0–22)

## 2024-03-15 MED ORDER — POTASSIUM CHLORIDE CRYS ER 20 MEQ PO TBCR
40.0000 meq | EXTENDED_RELEASE_TABLET | Freq: Once | ORAL | Status: AC
  Administered 2024-03-15: 40 meq via ORAL
  Filled 2024-03-15: qty 2

## 2024-03-15 MED ORDER — SODIUM CHLORIDE 0.9 % IV SOLN: INTRAVENOUS | Status: AC

## 2024-03-15 MED ORDER — METOPROLOL TARTRATE 25 MG PO TABS
25.0000 mg | ORAL_TABLET | Freq: Two times a day (BID) | ORAL | Status: DC
  Administered 2024-03-15 – 2024-03-16 (×3): 25 mg via ORAL
  Filled 2024-03-15 (×3): qty 1

## 2024-03-15 MED ORDER — MELATONIN 3 MG PO TABS
3.0000 mg | ORAL_TABLET | Freq: Every evening | ORAL | Status: DC | PRN
  Administered 2024-03-15: 3 mg via ORAL
  Filled 2024-03-15: qty 1

## 2024-03-15 NOTE — Progress Notes (Addendum)
   Patient Name: Sherry Daniels Date of Encounter: 03/15/2024 [facility] Cardiologist: Oneil Bigness, MD   Interval Summary  .    Left heart catheterization was performed yesterday with nonobstructive CAD, but consistent with Takotsubo cardiomyopathy.  Overnight patient had 1 episode of hypotension associated with feeling sweaty and bradycardia, sounds like vagal episode. Otherwise no new or acute complaints.  Vital Signs .    Vitals:   03/14/24 2247 03/15/24 0347 03/15/24 0533 03/15/24 0538  BP: 113/80 97/70 (!) 71/47 93/70  Pulse:  60 (!) 49 70  Resp:  18 20   Temp:  98.2 F (36.8 C) (!) 97.4 F (36.3 C)   TempSrc:  Oral Oral   SpO2:  99% 100% 100%  Weight:      Height:        Intake/Output Summary (Last 24 hours) at 03/15/2024 0819 Last data filed at 03/14/2024 1943 Gross per 24 hour  Intake 240 ml  Output --  Net 240 ml      03/14/2024    1:18 PM 02/25/2024   12:48 PM 01/08/2024    8:03 AM  Last 3 Weights  Weight (lbs) 169 lb 169 lb 3.2 oz 165 lb 9.6 oz  Weight (kg) 76.658 kg 76.749 kg 75.116 kg      Telemetry/ECG    Sinus rhythm/bradycardia - Personally Reviewed  Physical Exam .   GEN: No acute distress.   Neck: No JVD Cardiac: Normal rate, regular rhythm.  Respiratory: Clear to auscultation bilaterally. GI: Soft, nontender, non-distended  MS: No edema  Assessment & Plan .     #. Takotsubo cardiomyopathy:  #. Elevated troponin: In setting of Takotsubo.  - Will change to short acting metoprolol , to see if we can uptitrate to max tolerated doses. Metop tartrate 25mg  BID for now.  - Stop losartan  for now. Had some soft BPs overnight. Allow for permissive hypertension to increase afterload and reduce hyperdynamic obstruction, if present. - Echocardiogram is pending. Look to confirm diagnosis and r/o LV thrombus.   #. Chronic hypertension: Home regimen of amlodipine  7.5mg .  - Holding as above. Will consider transitioning to ACEI/ARB based on  echo results.   For questions or updates, please contact [facility] Please consult [email or web address] for contact info under     Signed, Ardeen Kohler, MD

## 2024-03-15 NOTE — Progress Notes (Signed)
 PROGRESS NOTE    Sherry Daniels  JXB:[id number] DOB: [birth year] DOA: 03/14/2024 PCP: Paseda, Folashade R, FNP   Brief Narrative: 63 year old with past medical history significant for hypertension, migraine, headache, GERD, asthma, hyperlipidemia,Sjogren's disease and NAFLD , presents complaining of chest discomfort  on exertion, associated with malaise, lightheadedness, sudden onset of diaphoresis.  Patient was brought to the ED by EMS.  Evaluation in the ED she was found to have a troponin of 1300 and persistent chest pain.  She was started on heparin  drip.  started on nitroglycerin  drip.  Cardiology  was consulted, patient was admitted to [facility].  Underwent heart cath which showed minimal nonobstructive coronary artery disease.  Ejection fraction 35%.   Assessment & Plan:   Principal Problem:   NSTEMI (non-ST elevated myocardial infarction) (HCC) Active Problems:   Essential hypertension   Mixed hyperlipidemia   GERD without esophagitis   Migraines   Mild intermittent asthma, uncomplicated   1-non-STEMI Chest pain Nonischemic cardiomyopathy systolic heart failure - CTA negative for PE Underwent cath: Minimal nonobstructive coronary artery disease with no significant stenoses. Severe segmental LV dysfunction with periapical ballooning, hyperdynamic basal segments, LVEF estimated at 35%, findings consistent with acute Takotsubo syndrome  -Started on low-dose metoprolol  - Will need ARB if blood pressure tolerated. - Will check ESR and double-stranded DNA - Metoprolol  was changed to 25 mg twice daily  Hypotension, near syncope; Patient had this morning an episode of near syncope became very lightheaded and weak and blood pressure was very low. Blood pressure medication has been on hold.  She was started on low rate IV fluid  Essential hypertension Continue to hold Norvasc , hydrochlorothiazide  due to hypotension  Hyperlipidemia Follow lipid panel  GERD; continue  PPI   Mild intermittent asthma Albuterol  as needed.   Sjogren's disease; Follows with rheumatology, she has had ANA positive.  Her double-stranded DNA has been negative.  Will make lupus less likely. I will repeat ESR and double-stranded DNA because she presented with these cardiomyopathy.  Follow echo follow-up for pericardial effusion.  Migraine: Continue with triptan therapy as needed Hypokalemia: Replete orally Estimated body mass index is 27.28 kg/m as calculated from the following:   Height as of this encounter: 5\' 6"  (1.676 m).   Weight as of this encounter: 76.7 kg.   DVT prophylaxis: Lovenox Code Status: Full code Family Communication: Discussed with patient and multiple family members at bedside Disposition Plan:  Status is: Inpatient Remains inpatient appropriate because: management of hypotension, cardiomyopathy     Consultants:  Cardiology  Procedures:    Antimicrobials:    Subjective: She Is alert, she is feeling better than this morning this morning she was very feeling very lightheaded She reported that the day of admission she developed this chest pain on exertion and felt diaphoretic and almost passed out. Over the last month she has been feeling weak and tired and without energy.  She has been feeling that this could be related to flare of her rheumatologic condition Objective: Vitals:   03/14/24 2247 03/15/24 0347 03/15/24 0533 03/15/24 0538  BP: 113/80 97/70 (!) 71/47 93/70  Pulse:  60 (!) 49 70  Resp:  18 20   Temp:  98.2 F (36.8 C) (!) 97.4 F (36.3 C)   TempSrc:  Oral Oral   SpO2:  99% 100% 100%  Weight:      Height:        Intake/Output Summary (Last 24 hours) at 03/15/2024 0743 Last data filed at 03/14/2024 1943  Gross per 24 hour  Intake 240 ml  Output --  Net 240 ml   Filed Weights   03/14/24 1318  Weight: 76.7 kg    Examination:  General exam: Appears calm and comfortable  Respiratory system: Clear to auscultation.  Respiratory effort normal. Cardiovascular system: S1 & S2 heard, RRR. No JVD, murmurs, rubs, gallops or clicks. No pedal edema. Gastrointestinal system: Abdomen is nondistended, soft and nontender. No organomegaly or masses felt. Normal bowel sounds heard. Central nervous system: Alert and oriented. No focal neurological deficits. Extremities: Symmetric 5 x 5 power. Skin: No rashes, lesions or ulcers Psychiatry: Judgement and insight appear normal. Mood & affect appropriate.     Data Reviewed: I have personally reviewed following labs and imaging studies  CBC: Recent Labs  Lab 03/14/24 1153 03/14/24 2001 03/15/24 0333  WBC 3.0* 6.6 4.5  NEUTROABS  --   --  2.3  HGB 12.5 12.4 12.3  HCT 38.9 37.8 37.4  MCV 92.0 88.5 89.3  PLT 222 242 238   Basic Metabolic Panel: Recent Labs  Lab 03/14/24 1153 03/14/24 2001 03/15/24 0333  NA 138  --  137  K 3.7  --  3.4*  CL 104  --  104  CO2 26  --  26  GLUCOSE 156*  --  113*  BUN 13  --  7  CREATININE 0.70 0.66 0.69  CALCIUM  9.2  --  8.9  MG  --   --  2.0   GFR: Estimated Creatinine Clearance: 78.3 mL/min (by C-G formula based on SCr of 0.69 mg/dL). Liver Function Tests: Recent Labs  Lab 03/15/24 0333  AST 42*  ALT 38  ALKPHOS 57  BILITOT 0.8  PROT 7.0  ALBUMIN 3.2*   No results for input(s): "LIPASE", "AMYLASE" in the last 168 hours. No results for input(s): "AMMONIA" in the last 168 hours. Coagulation Profile: No results for input(s): "INR", "PROTIME" in the last 168 hours. Cardiac Enzymes: No results for input(s): "CKTOTAL", "CKMB", "CKMBINDEX", "TROPONINI" in the last 168 hours. BNP (last 3 results) No results for input(s): "PROBNP" in the last 8760 hours. HbA1C: Recent Labs    03/15/24 0333  HGBA1C 5.7*   CBG: No results for input(s): "GLUCAP" in the last 168 hours. Lipid Profile: Recent Labs    03/15/24 0333  CHOL 256*  HDL 87  LDLCALC 156*  TRIG 67  CHOLHDL 2.9   Thyroid Function Tests: No results  for input(s): "TSH", "T4TOTAL", "FREET4", "T3FREE", "THYROIDAB" in the last 72 hours. Anemia Panel: No results for input(s): "VITAMINB12", "FOLATE", "FERRITIN", "TIBC", "IRON", "RETICCTPCT" in the last 72 hours. Sepsis Labs: No results for input(s): "PROCALCITON", "LATICACIDVEN" in the last 168 hours.  No results found for this or any previous visit (from the past 240 hours).       Radiology Studies: CARDIAC CATHETERIZATION Result Date: 03/14/2024 1.  Minimal nonobstructive coronary artery disease with no significant stenoses 2.  Severe segmental LV dysfunction with periapical ballooning, hyperdynamic basal segments, LVEF estimated at 35%, findings consistent with acute Takotsubo syndrome Recommendations: Will adjust her antihypertensive medications with discontinuation of amlodipine  and we will start her on metoprolol  succinate (beta-1 selective agent in the setting of asthma), and losartan .  Check 2D echo.  Counseled patient and family on findings, prognosis, and rationale for medical therapy.  Anticipate 24 to 48-hour hospitalization depending on her symptoms.   CT Angio Chest PE W/Cm &/Or Wo Cm Result Date: 03/14/2024 CLINICAL DATA:  Concern for pulmonary embolism. EXAM: CT ANGIOGRAPHY CHEST  WITH CONTRAST TECHNIQUE: Multidetector CT imaging of the chest was performed using the standard protocol during bolus administration of intravenous contrast. Multiplanar CT image reconstructions and MIPs were obtained to evaluate the vascular anatomy. RADIATION DOSE REDUCTION: This exam was performed according to the departmental dose-optimization program which includes automated exposure control, adjustment of the mA and/or kV according to patient size and/or use of iterative reconstruction technique. CONTRAST:  75mL OMNIPAQUE  IOHEXOL  350 MG/ML SOLN COMPARISON:  Chest CT dated 03/14/2024. FINDINGS: Cardiovascular: There is no cardiomegaly or pericardial effusion. The thoracic aorta is unremarkable. No  pulmonary artery embolus identified. Mediastinum/Nodes: No hilar or mediastinal adenopathy. The esophagus is grossly unremarkable. No mediastinal fluid collection. Lungs/Pleura: No focal consolidation, pleural effusion, pneumothorax. The central airways are patent. Upper Abdomen: Mild stranding adjacent to the head and uncinate process of the pancreas. Correlation with pancreatic enzymes recommended to exclude pancreatitis. Musculoskeletal: No acute osseous pathology. Review of the MIP images confirms the above findings. IMPRESSION: 1. No acute intrathoracic pathology. No CT evidence of pulmonary embolism. 2. Mild stranding adjacent to the head and uncinate process of the pancreas. Correlation with pancreatic enzymes recommended to exclude pancreatitis. Electronically Signed   By: Angus Bark M.D.   On: 03/14/2024 15:33   DG Chest 2 View Result Date: 03/14/2024 CLINICAL DATA:  chest pain EXAM: CHEST - 2 VIEW COMPARISON:  July 23, 2023 FINDINGS: No focal airspace consolidation, pleural effusion, or pneumothorax. No cardiomegaly. No acute fracture or destructive lesion. IMPRESSION: No acute cardiopulmonary abnormality. Electronically Signed   By: Rance Burrows M.D.   On: 03/14/2024 14:56        Scheduled Meds:  enoxaparin (LOVENOX) injection  40 mg Subcutaneous Q24H   hydrochlorothiazide   25 mg Oral Daily   losartan   25 mg Oral Daily   metoprolol  succinate  25 mg Oral Daily   potassium chloride   40 mEq Oral Once   sodium chloride  flush  3 mL Intravenous Q12H   Continuous Infusions:  sodium chloride      sodium chloride  75 mL/hr at 03/15/24 0602     LOS: 1 day    Time spent: 35 minutes    Kaijah Abts A Brant Peets, MD [facility]   If 7PM-7AM, please contact night-coverage [email or web address]  03/15/2024, 7:43 AM

## 2024-03-15 NOTE — Progress Notes (Addendum)
 Pt woke up feeling sweaty, sat on the edge of the bed, felt light headed. BP dropped to 71/47. HR 49. Complains of nausea and mild chest heaviness.   Assisted back to bed. Rechecked BP of 93/70. Pt feeling much better. Dr made aware. EKG done.   Today's Vitals   03/15/24 0243 03/15/24 0347 03/15/24 0533 03/15/24 0538  BP:  97/70 (!) 71/47 93/70  Pulse:  60 (!) 49 70  Resp:  18 20   Temp:  98.2 F (36.8 C) (!) 97.4 F (36.3 C)   TempSrc:  Oral Oral   SpO2:  99% 100% 100%  Weight:      Height:      PainSc: Asleep        03/15/24 0533  Assess: MEWS Score  Temp (!) 97.4 F (36.3 C)  BP (!) 71/47  MAP (mmHg) (!) 52  Pulse Rate (!) 49  ECG Heart Rate 63  Resp 20  SpO2 100 %  O2 Device Room Air  Assess: MEWS Score  MEWS Temp 0  MEWS Systolic 2  MEWS Pulse 0  MEWS RR 0  MEWS LOC 0  MEWS Score 2  MEWS Score Color Yellow  Assess: if the MEWS score is Yellow or Red  Were vital signs accurate and taken at a resting state? Yes  Does the patient meet 2 or more of the SIRS criteria? No  MEWS guidelines implemented  Yes, yellow  Treat  MEWS Interventions Considered administering scheduled or prn medications/treatments as ordered  Take Vital Signs  Increase Vital Sign Frequency  Yellow: Q2hr x1, continue Q4hrs until patient remains green for 12hrs  Escalate  MEWS: Escalate Yellow: Discuss with charge nurse and consider notifying provider and/or RRT  Notify: Charge Nurse/RN  Name of Charge Nurse/RN Notified Amor RN  Provider Notification  Provider Name/Title Dr Gladstone Lamer  Date Provider Notified 03/15/24  Time Provider Notified 0533  Method of Notification Page (secure chat; Dr acknowledged)  Notification Reason Change in status  Provider response See new orders  Date of Provider Response 03/15/24  Time of Provider Response 0545  Assess: SIRS CRITERIA  SIRS Temperature  0  SIRS Respirations  0  SIRS Pulse 0  SIRS WBC 0  SIRS Score Sum  0

## 2024-03-15 NOTE — Plan of Care (Signed)
  Problem: Education: Goal: Knowledge of General Education information will improve Description: Including pain rating scale, medication(s)/side effects and non-pharmacologic comfort measures Outcome: Progressing   Problem: Health Behavior/Discharge Planning: Goal: Ability to manage health-related needs will improve Outcome: Progressing   Problem: Clinical Measurements: Goal: Ability to maintain clinical measurements within normal limits will improve Outcome: Progressing Goal: Will remain free from infection Outcome: Progressing Goal: Diagnostic test results will improve Outcome: Progressing Goal: Respiratory complications will improve Outcome: Progressing Goal: Cardiovascular complication will be avoided Outcome: Progressing   Problem: Activity: Goal: Risk for activity intolerance will decrease Outcome: Progressing   Problem: Nutrition: Goal: Adequate nutrition will be maintained Outcome: Progressing   Problem: Coping: Goal: Level of anxiety will decrease Outcome: Progressing   Problem: Pain Managment: Goal: General experience of comfort will improve and/or be controlled Outcome: Progressing   Problem: Safety: Goal: Ability to remain free from injury will improve Outcome: Progressing   Problem: Skin Integrity: Goal: Risk for impaired skin integrity will decrease Outcome: Progressing   Problem: Education: Goal: Understanding of CV disease, CV risk reduction, and recovery process will improve Outcome: Progressing Goal: Individualized Educational Video(s) Outcome: Progressing   Problem: Activity: Goal: Ability to return to baseline activity level will improve Outcome: Progressing   Problem: Cardiovascular: Goal: Ability to achieve and maintain adequate cardiovascular perfusion will improve Outcome: Progressing Goal: Vascular access site(s) Level 0-1 will be maintained Outcome: Progressing   Problem: Health Behavior/Discharge Planning: Goal: Ability to  safely manage health-related needs after discharge will improve Outcome: Progressing

## 2024-03-15 NOTE — Progress Notes (Signed)
  Echocardiogram 2D Echocardiogram has been performed.  Sherry Daniels 03/15/2024, 3:50 PM

## 2024-03-16 DIAGNOSIS — I5181 Takotsubo syndrome: Secondary | ICD-10-CM | POA: Diagnosis not present

## 2024-03-16 DIAGNOSIS — I214 Non-ST elevation (NSTEMI) myocardial infarction: Secondary | ICD-10-CM | POA: Diagnosis not present

## 2024-03-16 LAB — CBC
HCT: 33.7 % — ABNORMAL LOW (ref 36.0–46.0)
Hemoglobin: 10.8 g/dL — ABNORMAL LOW (ref 12.0–15.0)
MCH: 29.3 pg (ref 26.0–34.0)
MCHC: 32 g/dL (ref 30.0–36.0)
MCV: 91.3 fL (ref 80.0–100.0)
Platelets: 220 10*3/uL (ref 150–400)
RBC: 3.69 MIL/uL — ABNORMAL LOW (ref 3.87–5.11)
RDW: 13.8 % (ref 11.5–15.5)
WBC: 4.2 10*3/uL (ref 4.0–10.5)
nRBC: 0 % (ref 0.0–0.2)

## 2024-03-16 LAB — BASIC METABOLIC PANEL WITH GFR
Anion gap: 6 (ref 5–15)
BUN: 10 mg/dL (ref 6–20)
CO2: 22 mmol/L (ref 22–32)
Calcium: 8.6 mg/dL — ABNORMAL LOW (ref 8.9–10.3)
Chloride: 110 mmol/L (ref 98–111)
Creatinine, Ser: 0.8 mg/dL (ref 0.44–1.00)
GFR, Estimated: >60 mL/min (ref >60)
Glucose, Bld: 100 mg/dL — ABNORMAL HIGH (ref 70–99)
Potassium: 3.8 mmol/L (ref 3.5–5.1)
Sodium: 138 mmol/L (ref 135–145)

## 2024-03-16 LAB — ANTI-DNA ANTIBODY, DOUBLE-STRANDED: ds DNA Ab: 1 [IU]/mL (ref 0–9)

## 2024-03-16 MED ORDER — METOPROLOL TARTRATE 25 MG PO TABS: 25.0000 mg | ORAL_TABLET | Freq: Two times a day (BID) | ORAL | 1 refills | Status: DC

## 2024-03-16 NOTE — Plan of Care (Signed)
 Problem: Education: Goal: Knowledge of General Education information will improve Description: Including pain rating scale, medication(s)/side effects and non-pharmacologic comfort measures 03/16/2024 1121 by Sunnie England, RN Outcome: Adequate for Discharge 03/16/2024 1121 by Sunnie England, RN Outcome: Adequate for Discharge   Problem: Health Behavior/Discharge Planning: Goal: Ability to manage health-related needs will improve 03/16/2024 1121 by Sunnie England, RN Outcome: Adequate for Discharge 03/16/2024 1121 by Sunnie England, RN Outcome: Adequate for Discharge   Problem: Clinical Measurements: Goal: Ability to maintain clinical measurements within normal limits will improve 03/16/2024 1121 by Sunnie England, RN Outcome: Adequate for Discharge 03/16/2024 1121 by Sunnie England, RN Outcome: Adequate for Discharge Goal: Will remain free from infection 03/16/2024 1121 by Sunnie England, RN Outcome: Adequate for Discharge 03/16/2024 1121 by Sunnie England, RN Outcome: Adequate for Discharge Goal: Diagnostic test results will improve 03/16/2024 1121 by Sunnie England, RN Outcome: Adequate for Discharge 03/16/2024 1121 by Sunnie England, RN Outcome: Adequate for Discharge Goal: Respiratory complications will improve 03/16/2024 1121 by Sunnie England, RN Outcome: Adequate for Discharge 03/16/2024 1121 by Sunnie England, RN Outcome: Adequate for Discharge Goal: Cardiovascular complication will be avoided 03/16/2024 1121 by Sunnie England, RN Outcome: Adequate for Discharge 03/16/2024 1121 by Sunnie England, RN Outcome: Adequate for Discharge   Problem: Activity: Goal: Risk for activity intolerance will decrease 03/16/2024 1121 by Sunnie England, RN Outcome: Adequate for Discharge 03/16/2024 1121 by Sunnie England, RN Outcome: Adequate for Discharge   Problem: Nutrition: Goal: Adequate nutrition will be maintained 03/16/2024 1121 by  Sunnie England, RN Outcome: Adequate for Discharge 03/16/2024 1121 by Sunnie England, RN Outcome: Adequate for Discharge   Problem: Coping: Goal: Level of anxiety will decrease 03/16/2024 1121 by Sunnie England, RN Outcome: Adequate for Discharge 03/16/2024 1121 by Sunnie England, RN Outcome: Adequate for Discharge   Problem: Elimination: Goal: Will not experience complications related to bowel motility 03/16/2024 1121 by Sunnie England, RN Outcome: Adequate for Discharge 03/16/2024 1121 by Sunnie England, RN Outcome: Adequate for Discharge Goal: Will not experience complications related to urinary retention 03/16/2024 1121 by Sunnie England, RN Outcome: Adequate for Discharge 03/16/2024 1121 by Sunnie England, RN Outcome: Adequate for Discharge   Problem: Pain Managment: Goal: General experience of comfort will improve and/or be controlled 03/16/2024 1121 by Sunnie England, RN Outcome: Adequate for Discharge 03/16/2024 1121 by Sunnie England, RN Outcome: Adequate for Discharge   Problem: Safety: Goal: Ability to remain free from injury will improve 03/16/2024 1121 by Sunnie England, RN Outcome: Adequate for Discharge 03/16/2024 1121 by Sunnie England, RN Outcome: Adequate for Discharge   Problem: Skin Integrity: Goal: Risk for impaired skin integrity will decrease 03/16/2024 1121 by Sunnie England, RN Outcome: Adequate for Discharge 03/16/2024 1121 by Sunnie England, RN Outcome: Adequate for Discharge   Problem: Education: Goal: Understanding of CV disease, CV risk reduction, and recovery process will improve 03/16/2024 1121 by Sunnie England, RN Outcome: Adequate for Discharge 03/16/2024 1121 by Sunnie England, RN Outcome: Adequate for Discharge Goal: Individualized Educational Video(s) 03/16/2024 1121 by Sunnie England, RN Outcome: Adequate for Discharge 03/16/2024 1121 by Sunnie England, RN Outcome: Adequate for  Discharge   Problem: Activity: Goal: Ability to return to baseline activity level will improve 03/16/2024 1121 by Sunnie England, RN Outcome: Adequate for Discharge 03/16/2024 1121 by Sunnie England, RN Outcome: Adequate for  Discharge   Problem: Cardiovascular: Goal: Ability to achieve and maintain adequate cardiovascular perfusion will improve 03/16/2024 1121 by Sunnie England, RN Outcome: Adequate for Discharge 03/16/2024 1121 by Sunnie England, RN Outcome: Adequate for Discharge Goal: Vascular access site(s) Level 0-1 will be maintained 03/16/2024 1121 by Sunnie England, RN Outcome: Adequate for Discharge 03/16/2024 1121 by Sunnie England, RN Outcome: Adequate for Discharge   Problem: Health Behavior/Discharge Planning: Goal: Ability to safely manage health-related needs after discharge will improve 03/16/2024 1121 by Sunnie England, RN Outcome: Adequate for Discharge 03/16/2024 1121 by Sunnie England, RN Outcome: Adequate for Discharge

## 2024-03-16 NOTE — Discharge Summary (Signed)
 Physician Discharge Summary   Patient: Sherry Daniels MRN: [id number] DOB: 05/18/1961  Admit date:     03/14/2024  Discharge date: 03/16/24  Discharge Physician: Danette Duos   PCP: Paseda, Folashade R, FNP   Recommendations at discharge:   Needs to follow up with cardiology for further care Cardiomyopathy.  Needs to follow up with Rheumatology   Discharge Diagnoses: Principal Problem:   NSTEMI (non-ST elevated myocardial infarction) (HCC) Active Problems:   Essential hypertension   Mixed hyperlipidemia   GERD without esophagitis   Migraines   Mild intermittent asthma, uncomplicated  Resolved Problems:   * No resolved hospital problems. *  Hospital Course: 63 year old with past medical history significant for hypertension, migraine, headache, GERD, asthma, hyperlipidemia,Sjogren's disease and NAFLD , presents complaining of chest discomfort  on exertion, associated with malaise, lightheadedness, sudden onset of diaphoresis.  Patient was brought to the ED by EMS.  63 year old with past medical history significant found to have a troponin of 1300 and persistent chest pain.  She was started on heparin  drip.  started on nitroglycerin  drip.  Cardiology  was consulted, patient was admitted to [facility]  Underwent heart cath which showed minimal nonobstructive coronary artery disease.  Ejection fraction 35%.   Assessment and Plan: 1-non-STEMI Chest pain Nonischemic cardiomyopathy systolic heart failure - CTA negative for PE Underwent cath: Minimal nonobstructive coronary artery disease with no significant stenoses. Severe segmental LV dysfunction with periapical ballooning, hyperdynamic basal segments, LVEF estimated at 35%, findings consistent with acute Takotsubo syndrome  -Started on low-dose metoprolol , tolerating it  - Will need ARB if blood pressure tolerated. Will need to be started out patient.  -  ESR normal and double-stranded DNA pending - Metoprolol  was changed to 25 mg twice  daily  stable for discharge  Hypotension, near syncope; Patient had this morning an episode of near syncope became very lightheaded and weak and blood pressure was very low. Blood pressure medication has been on hold.  She was started on low rate IV fluid Resolved./  She tolerated low dose metoprolol   Essential hypertension Continue to hold Norvasc , hydrochlorothiazide  due to hypotension   Hyperlipidemia Follow lipid panel   GERD; continue PPI     Mild intermittent asthma Albuterol  as needed.     Sjogren's disease; Follows with rheumatology, she has had ANA positive.  Her double-stranded DNA has been negative.  Will make lupus less likely.  ESR and double-stranded DNA ordered, because she presented with these cardiomyopathy.  No pericardial effusion. ESR normal.    Migraine: Continue with triptan therapy as needed Hypokalemia: Replaced Estimated body mass index is 27.28 kg/m as calculated from the following:   Height as of this encounter: 5\' 6"  (1.676 m).   Weight as of this encounter: 76.7 kg.          Consultants: Cardiology  Procedures performed: Cath  Disposition: Home Diet recommendation:  Discharge Diet Orders (From admission, onward)     Start     Ordered   03/16/24 0000  Diet - low sodium heart healthy        03/16/24 1104           Cardiac diet DISCHARGE MEDICATION: Allergies as of 03/16/2024       Reactions   Aspirin  Nausea Only   Codeine Nausea Only   Cymbalta  [duloxetine  Hcl]    Felt like it worsened anxiety and fibromyalgia   Simvastatin Other (See Comments)   dizziness        Medication List  STOP taking these medications    amLODipine  5 MG tablet Commonly known as: NORVASC    hydrochlorothiazide  25 MG tablet Commonly known as: HYDRODIURIL    ibuprofen  600 MG tablet Commonly known as: ADVIL    methylPREDNISolone  4 MG Tbpk tablet Commonly known as: MEDROL  DOSEPAK       TAKE these medications    acetaminophen  500 MG  tablet Commonly known as: TYLENOL  Take 1,500 mg by mouth every 6 (six) hours as needed for mild pain (pain score 1-3). Reported on 05/01/2016   albuterol  108 (90 Base) MCG/ACT inhaler Commonly known as: VENTOLIN  HFA USE 2 PUFFS EVERY 4 TO 6 HOURS AS NEEDED   albuterol  (2.5 MG/3ML) 0.083% nebulizer solution Commonly known as: PROVENTIL  Take 3 mLs (2.5 mg total) by nebulization every 6 (six) hours as needed for wheezing or shortness of breath.   aspirin  EC 81 MG tablet 1 tablet po qOD What changed:  how much to take how to take this when to take this additional instructions   estradiol 0.1 MG/24HR patch Commonly known as: VIVELLE-DOT Place 1 patch onto the skin 2 (two) times a week.   estradiol 0.1 MG/GM vaginal cream Commonly known as: ESTRACE Place 1 g vaginally 3 (three) times a week. Apply 1 application on Monday, Wednesdays, and Fridays per patient   metoprolol  tartrate 25 MG tablet Commonly known as: LOPRESSOR  Take 1 tablet (25 mg total) by mouth 2 (two) times daily.   multivitamin with minerals tablet Take 1 tablet by mouth daily.   Omega-3 1000 MG Caps Take 1 capsule by mouth daily.   promethazine  12.5 MG tablet Commonly known as: PHENERGAN  Take 1 tablet (12.5 mg total) by mouth every 8 (eight) hours as needed for nausea or vomiting.   SUMAtriptan  50 MG tablet Commonly known as: Imitrex  Take 1 tablet (50 mg total) by mouth 2 (two) times daily as needed for migraine. May repeat in 2 hours if headache persists or recurs.   TART CHERRY ADVANCED PO Take 1 tablet by mouth daily.   TURMERIC PO Take by mouth.   Vitamin D  (Ergocalciferol ) 1.25 MG (50000 UNIT) Caps capsule Commonly known as: DRISDOL  Take 1 capsule (50,000 Units total) by mouth every 7 (seven) days.        Follow-up Information     Paseda, Folashade R, FNP Follow up in 1 week(s).   Specialty: Nurse Practitioner Contact information: [address] [place] [address] [place], [place]  [address] [phone]                Discharge Exam: Cleavon Curls Weights   03/14/24 1318  Weight: 76.7 kg   General; NAD  Condition at discharge: stable  The results of significant diagnostics from this hospitalization (including imaging, microbiology, ancillary and laboratory) are listed below for reference.   Imaging Studies: ECHOCARDIOGRAM COMPLETE Result Date: 03/15/2024    ECHOCARDIOGRAM REPORT   Patient Name:   JASLINE CASTELLINI Date of Exam: 03/15/2024 Medical Rec #:  [id number]               Height:       66.0 in Accession #:    [id number]              Weight:       169.0 lb Date of Birth:  [birth year]               BSA:          1.862 m Patient Age:    62 years  BP:           111/78 mmHg Patient Gender: F                       HR:           62 bpm. Exam Location:  Inpatient Procedure: 2D Echo (Both Spectral and Color Flow Doppler were utilized during            procedure). Indications:    Chest pain  History:        Patient has no prior history of Echocardiogram examinations.                 Risk Factors:Hypertension and Dyslipidemia.  Sonographer:    Dione Franks RDCS Referring Phys: [phone] MICHAEL COOPER  Sonographer Comments: Global longitudinal strain was attempted. IMPRESSIONS  1. Mid/apical inferior wall septal and apical akinesis . Left ventricular ejection fraction, by estimation, is 40 to 45%. The left ventricle has mildly decreased function. The left ventricle demonstrates regional wall motion abnormalities (see scoring diagram/findings for description). The left ventricular internal cavity size was mildly dilated. Left ventricular diastolic parameters were normal. The average left ventricular global longitudinal strain is -19.5 %. The global longitudinal strain is normal.  2. Right ventricular systolic function is normal. The right ventricular size is normal. There is normal pulmonary artery systolic pressure.  3. The mitral valve is abnormal. Mild mitral valve  regurgitation. No evidence of mitral stenosis.  4. The aortic valve is tricuspid. Aortic valve regurgitation is not visualized. No aortic stenosis is present.  5. The inferior vena cava is dilated in size with >50% respiratory variability, suggesting right atrial pressure of 8 mmHg. FINDINGS  Left Ventricle: Mid/apical inferior wall septal and apical akinesis. Left ventricular ejection fraction, by estimation, is 40 to 45%. The left ventricle has mildly decreased function. The left ventricle demonstrates regional wall motion abnormalities. The average left ventricular global longitudinal strain is -19.5 %. Strain was performed and the global longitudinal strain is normal. The left ventricular internal cavity size was mildly dilated. There is no left ventricular hypertrophy. Left ventricular diastolic parameters were normal. Right Ventricle: The right ventricular size is normal. No increase in right ventricular wall thickness. Right ventricular systolic function is normal. There is normal pulmonary artery systolic pressure. The tricuspid regurgitant velocity is 2.20 m/s, and  with an assumed right atrial pressure of 5 mmHg, the estimated right ventricular systolic pressure is 24.4 mmHg. Left Atrium: Left atrial size was normal in size. Right Atrium: Right atrial size was normal in size. Pericardium: There is no evidence of pericardial effusion. Mitral Valve: The mitral valve is abnormal. There is mild thickening of the mitral valve leaflet(s). Mild mitral valve regurgitation. No evidence of mitral valve stenosis. Tricuspid Valve: The tricuspid valve is normal in structure. Tricuspid valve regurgitation is mild . No evidence of tricuspid stenosis. Aortic Valve: The aortic valve is tricuspid. Aortic valve regurgitation is not visualized. No aortic stenosis is present. Pulmonic Valve: The pulmonic valve was normal in structure. Pulmonic valve regurgitation is trivial. No evidence of pulmonic stenosis. Aorta: The aortic  root is normal in size and structure. Venous: The inferior vena cava is dilated in size with greater than 50% respiratory variability, suggesting right atrial pressure of 8 mmHg. IAS/Shunts: No atrial level shunt detected by color flow Doppler. Additional Comments: 3D was performed not requiring image post processing on an independent workstation and was indeterminate.  LEFT VENTRICLE PLAX 2D LVIDd:  4.60 cm     Diastology LVIDs:         2.50 cm     LV e' medial:    7.62 cm/s LV PW:         0.90 cm     LV E/e' medial:  9.3 LV IVS:        1.00 cm     LV e' lateral:   11.60 cm/s LVOT diam:     1.60 cm     LV E/e' lateral: 6.1 LV SV:         49 LV SV Index:   26          2D Longitudinal Strain LVOT Area:     2.01 cm    2D Strain GLS Avg:     -19.5 %  LV Volumes (MOD) LV vol d, MOD A4C: 81.8 ml LV vol s, MOD A4C: 39.3 ml LV SV MOD A4C:     81.8 ml RIGHT VENTRICLE             IVC RV Basal diam:  2.90 cm     IVC diam: 2.10 cm RV S prime:     12.60 cm/s TAPSE (M-mode): 2.0 cm LEFT ATRIUM             Index        RIGHT ATRIUM           Index LA diam:        3.50 cm 1.88 cm/m   RA Area:     12.80 cm LA Vol (A2C):   45.6 ml 24.49 ml/m  RA Volume:   32.50 ml  17.46 ml/m LA Vol (A4C):   43.7 ml 23.47 ml/m LA Biplane Vol: 47.0 ml 25.25 ml/m  AORTIC VALVE LVOT Vmax:   104.00 cm/s LVOT Vmean:  69.700 cm/s LVOT VTI:    0.245 m  AORTA Ao Root diam: 2.50 cm Ao Asc diam:  2.90 cm MITRAL VALVE               TRICUSPID VALVE MV Area (PHT): 3.91 cm    TR Peak grad:   19.4 mmHg MV Decel Time: 194 msec    TR Vmax:        220.00 cm/s MR Peak grad: 72.9 mmHg MR Mean grad: 52.0 mmHg    SHUNTS MR Vmax:      427.00 cm/s  Systemic VTI:  0.24 m MR Vmean:     344.0 cm/s   Systemic Diam: 1.60 cm MV E velocity: 71.00 cm/s MV A velocity: 65.60 cm/s MV E/A ratio:  1.08 Janelle Mediate MD Electronically signed by Janelle Mediate MD Signature Date/Time: 03/15/2024/5:09:51 PM    Final    CARDIAC CATHETERIZATION Result Date: 03/14/2024 1.  Minimal  nonobstructive coronary artery disease with no significant stenoses 2.  Severe segmental LV dysfunction with periapical ballooning, hyperdynamic basal segments, LVEF estimated at 35%, findings consistent with acute Takotsubo syndrome Recommendations: Will adjust her antihypertensive medications with discontinuation of amlodipine  and we will start her on metoprolol  succinate (beta-1 selective agent in the setting of asthma), and losartan .  Check 2D echo.  Counseled patient and family on findings, prognosis, and rationale for medical therapy.  Anticipate 24 to 48-hour hospitalization depending on her symptoms.   CT Angio Chest PE W/Cm &/Or Wo Cm Result Date: 03/14/2024 CLINICAL DATA:  Concern for pulmonary embolism. EXAM: CT ANGIOGRAPHY CHEST WITH CONTRAST TECHNIQUE: Multidetector CT imaging of the chest was performed using the standard protocol during  bolus administration of intravenous contrast. Multiplanar CT image reconstructions and MIPs were obtained to evaluate the vascular anatomy. RADIATION DOSE REDUCTION: This exam was performed according to the departmental dose-optimization program which includes automated exposure control, adjustment of the mA and/or kV according to patient size and/or use of iterative reconstruction technique. CONTRAST:  75mL OMNIPAQUE  IOHEXOL  350 MG/ML SOLN COMPARISON:  Chest CT dated 03/14/2024. FINDINGS: Cardiovascular: There is no cardiomegaly or pericardial effusion. The thoracic aorta is unremarkable. No pulmonary artery embolus identified. Mediastinum/Nodes: No hilar or mediastinal adenopathy. The esophagus is grossly unremarkable. No mediastinal fluid collection. Lungs/Pleura: No focal consolidation, pleural effusion, pneumothorax. The central airways are patent. Upper Abdomen: Mild stranding adjacent to the head and uncinate process of the pancreas. Correlation with pancreatic enzymes recommended to exclude pancreatitis. Musculoskeletal: No acute osseous pathology. Review of  the MIP images confirms the above findings. IMPRESSION: 1. No acute intrathoracic pathology. No CT evidence of pulmonary embolism. 2. Mild stranding adjacent to the head and uncinate process of the pancreas. Correlation with pancreatic enzymes recommended to exclude pancreatitis. Electronically Signed   By: Angus Bark M.D.   On: 03/14/2024 15:33   DG Chest 2 View Result Date: 03/14/2024 CLINICAL DATA:  chest pain EXAM: CHEST - 2 VIEW COMPARISON:  July 23, 2023 FINDINGS: No focal airspace consolidation, pleural effusion, or pneumothorax. No cardiomegaly. No acute fracture or destructive lesion. IMPRESSION: No acute cardiopulmonary abnormality. Electronically Signed   By: Rance Burrows M.D.   On: 03/14/2024 14:56    Microbiology: Results for orders placed or performed in visit on 08/15/19  Novel Coronavirus, NAA ([facility])     Status: None   Collection Time: 08/15/19 10:40 AM   Specimen: Oropharyngeal(OP) collection in vial transport medium   OROPHARYNGEA  TESTING  Result Value Ref Range Status   SARS-CoV-2, NAA Not Detected Not Detected Final    Comment: Testing was performed using the cobas(R) SARS-CoV-2 test. This nucleic acid amplification test was developed and its performance characteristics determined by [employer]. Nucleic acid amplification tests include PCR and TMA. This test has not been FDA cleared or approved. This test has been authorized by FDA under an Emergency Use Authorization (EUA). This test is only authorized for the duration of time the declaration that circumstances exist justifying the authorization of the emergency use of in vitro diagnostic tests for detection of SARS-CoV-2 virus and/or diagnosis of COVID-19 infection under section 564(b)(1) of the Act, 21 U.S.C. 161WRU-0(A) (1), unless the authorization is terminated or revoked sooner. When diagnostic testing is negative, the possibility of a false negative result should be considered in the  context of a patient's recent exposures and the presence of clinical signs and symptoms consistent with COVID-19. An individual without symptoms  of COVID-19 and who is not shedding SARS-CoV-2 virus would expect to have a negative (not detected) result in this assay.     Labs: CBC: Recent Labs  Lab 03/14/24 1153 03/14/24 2001 03/15/24 0333 03/16/24 0340  WBC 3.0* 6.6 4.5 4.2  NEUTROABS  --   --  2.3  --   HGB 12.5 12.4 12.3 10.8*  HCT 38.9 37.8 37.4 33.7*  MCV 92.0 88.5 89.3 91.3  PLT 222 242 238 220   Basic Metabolic Panel: Recent Labs  Lab 03/14/24 1153 03/14/24 2001 03/15/24 0333 03/16/24 0839  NA 138  --  137 138  K 3.7  --  3.4* 3.8  CL 104  --  104 110  CO2 26  --  26 22  GLUCOSE 156*  --  113* 100*  BUN 13  --  7 10  CREATININE 0.70 0.66 0.69 0.80  CALCIUM  9.2  --  8.9 8.6*  MG  --   --  2.0  --    Liver Function Tests: Recent Labs  Lab 03/15/24 0333  AST 42*  ALT 38  ALKPHOS 57  BILITOT 0.8  PROT 7.0  ALBUMIN 3.2*   CBG: No results for input(s): "GLUCAP" in the last 168 hours.  Discharge time spent: greater than 30 minutes.  Signed: Danette Duos, MD [facility] 03/16/2024

## 2024-03-16 NOTE — Plan of Care (Signed)
  Problem: Health Behavior/Discharge Planning: Goal: Ability to manage health-related needs will improve Outcome: Progressing   Problem: Clinical Measurements: Goal: Will remain free from infection Outcome: Progressing Goal: Respiratory complications will improve Outcome: Progressing Goal: Cardiovascular complication will be avoided Outcome: Progressing   Problem: Activity: Goal: Risk for activity intolerance will decrease Outcome: Progressing   Problem: Nutrition: Goal: Adequate nutrition will be maintained Outcome: Progressing   Problem: Safety: Goal: Ability to remain free from injury will improve Outcome: Progressing

## 2024-03-16 NOTE — Progress Notes (Signed)
   Patient Name: Sherry Daniels Date of Encounter: 03/16/2024 [facility] Cardiologist: Oneil Bigness, MD   Interval Summary  .    No acute overnight events. Feeling better today. Tolerated metoprolol  tartrate better than XL. No CP or SOB, just some fatigue. No new or acute complaints.   Vital Signs .    Vitals:   03/15/24 0929 03/15/24 1950 03/15/24 2239 03/16/24 0500  BP: 111/78 (!) 129/92 101/65 102/67  Pulse:  65 65   Resp:  18  18  Temp:  98.2 F (36.8 C)  (!) 97.4 F (36.3 C)  TempSrc:  Oral  Oral  SpO2:  95%    Weight:      Height:        Intake/Output Summary (Last 24 hours) at 03/16/2024 0809 Last data filed at 03/16/2024 0700 Gross per 24 hour  Intake 2334.64 ml  Output 0 ml  Net 2334.64 ml      03/14/2024    1:18 PM 02/25/2024   12:48 PM 01/08/2024    8:03 AM  Last 3 Weights  Weight (lbs) 169 lb 169 lb 3.2 oz 165 lb 9.6 oz  Weight (kg) 76.658 kg 76.749 kg 75.116 kg      Telemetry/ECG    Sinus rhythm/bradycardia - Personally Reviewed  Physical Exam .   GEN: No acute distress.   Neck: No JVD Cardiac: Normal rate, regular rhythm.  Respiratory: Clear to auscultation bilaterally. GI: Soft, nontender, non-distended  MS: No edema  Assessment & Plan .     #. Takotsubo cardiomyopathy: LHC w/o obstructive CAD. Ventriculogram and echo show apical akinesis, consistent with Takotsubo.  #. Elevated troponin: In setting of Takotsubo.  - Continue metoprolol  tartrate 25mg  BID. Did not tolerate XL d/t nausea.  - Stopped losartan  for now due to some soft BPs. Allow for permissive hypertension to increase afterload and reduce chances of hyperdynamic obstruction. Will reintroduce ACEI/ARB as BP allows, may have to be done as outpatient.  - Echocardiogram w/o LV thrombus.   #. Chronic hypertension: Home regimen of amlodipine  7.5mg  and hydrochlorothiazide .  - Holding as above. Recommend transitioning to ACEI/ARB as BP allows. I suspect BP will return to  normal outside of hospital and she can be initiated at follow up visit.   Okay for discharge. We will arrange for follow up clinic visit.   For questions or updates, please contact  [facility] Please consult [email or web address] for contact info under    Signed, Ardeen Kohler, MD

## 2024-03-17 ENCOUNTER — Telehealth: Payer: Self-pay

## 2024-03-17 ENCOUNTER — Telehealth: Payer: Self-pay | Admitting: General Practice

## 2024-03-17 MED ORDER — LOSARTAN POTASSIUM 50 MG PO TABS: 50.0000 mg | ORAL_TABLET | Freq: Every day | ORAL | 3 refills | Status: DC

## 2024-03-17 NOTE — Transitions of Care (Post Inpatient/ED Visit) (Signed)
 03/17/2024  Name: Sherry Daniels MRN: [id number] DOB: Jun 11, 1961  Today's TOC FU Call Status: Today's TOC FU Call Status:: Successful TOC FU Call Completed TOC FU Call Complete Date: 03/16/24 Patient's Name and Date of Birth confirmed.  Transition Care Management Follow-up Telephone Call Date of Discharge: 03/16/24 Discharge Facility: [facility]) Type of Discharge: Inpatient Admission Primary Inpatient Discharge Diagnosis:: CHF How have you been since you were released from the hospital?: Better Any questions or concerns?: No  Items Reviewed: Did you receive and understand the discharge instructions provided?: Yes Medications obtained,verified, and reconciled?: Yes (Medications Reviewed) Any new allergies since your discharge?: No Dietary orders reviewed?: Yes Do you have support at home?: No  Medications Reviewed Today: Medications Reviewed Today     Reviewed by Darrall Ellison, LPN (Licensed Practical Nurse) on 03/17/24 at 0935  Med List Status: <None>   Medication Order Taking? Sig Documenting Provider Last Dose Status Informant  acetaminophen  (TYLENOL ) 500 MG tablet [id number] No Take 1,500 mg by mouth every 6 (six) hours as needed for mild pain (pain score 1-3). Reported on 05/01/2016 [provider] 03/14/2024 Morning Active Self  albuterol  (PROVENTIL ) (2.5 MG/3ML) 0.083% nebulizer solution [id number] No Take 3 mLs (2.5 mg total) by nebulization every 6 (six) hours as needed for wheezing or shortness of breath. Wilhemena Harbour, NP Past Week Active Self  albuterol  (VENTOLIN  HFA) 108 [phone] Base) MCG/ACT inhaler [id number] No USE 2 PUFFS EVERY 4 TO 6 HOURS AS NEEDED Austine Lefort, MD Past Week Active Self  aspirin  81 MG EC tablet [id number] No 1 tablet po qOD  Patient taking differently: Take 81 mg by mouth daily.   Tysinger, Christiane Cowing, PA-C Past Week Active Self  estradiol (ESTRACE) 0.1 MG/GM vaginal cream [id number] No Place 1 g vaginally 3 (three) times a  week. Apply 1 application on Monday, Wednesdays, and Fridays per patient [provider] 03/05/2024 Active Self  estradiol (VIVELLE-DOT) 0.1 MG/24HR patch [id number] No Place 1 patch onto the skin 2 (two) times a week. [provider] 03/11/2024 Active Self  metoprolol  tartrate (LOPRESSOR ) 25 MG tablet [id number]  Take 1 tablet (25 mg total) by mouth 2 (two) times daily. Danette Duos, MD  Active   Misc Natural Products (TART CHERRY ADVANCED PO) [id number] No Take 1 tablet by mouth daily. [provider] 03/13/2024 Morning Active Self  Multiple Vitamins-Minerals (MULTIVITAMIN WITH MINERALS) tablet [id number] No Take 1 tablet by mouth daily. [provider] 03/13/2024 Morning Active Self  Omega-3 1000 MG CAPS [id number] No Take 1 capsule by mouth daily. [provider] 03/13/2024 Morning Active Self  promethazine  (PHENERGAN ) 12.5 MG tablet [id number] No Take 1 tablet (12.5 mg total) by mouth every 8 (eight) hours as needed for nausea or vomiting. Wilhemena Harbour, NP Past Week Active Self           Med Note Murrell Arrant, MARISSA C   Tue Jan 08, 2024  8:08 AM)    SUMAtriptan  (IMITREX ) 50 MG tablet [id number] No Take 1 tablet (50 mg total) by mouth 2 (two) times daily as needed for migraine. May repeat in 2 hours if headache persists or recurs. Emmanuel Hark B, NP 03/13/2024 Evening Active Self  TURMERIC PO [id number] No Take by mouth. [provider] 03/13/2024 Morning Active Self  Vitamin D , Ergocalciferol , (DRISDOL ) 1.25 MG (50000 UNIT) CAPS capsule [id number] No Take 1 capsule (50,000 Units total) by mouth every 7 (seven) days. Romayne Clubs, PA-C 03/12/2024 Active Self  Med Note (SATTERFIELD, DARIUS E   Fri Mar 14, 2024 10:01 PM) Take on Wednesdays             Home Care and Equipment/Supplies: Were Home Health Services Ordered?: NA Any new equipment or medical supplies ordered?: NA  Functional Questionnaire: Do you need assistance with  bathing/showering or dressing?: No Do you need assistance with meal preparation?: No Do you need assistance with eating?: No Do you have difficulty maintaining continence: No Do you need assistance with getting out of bed/getting out of a chair/moving?: No Do you have difficulty managing or taking your medications?: No  Follow up appointments reviewed: PCP Follow-up appointment confirmed?: Yes Date of PCP follow-up appointment?: 03/25/24 Follow-up Provider: [facility] Follow-up appointment confirmed?: No Reason Specialist Follow-Up Not Confirmed: Patient has Specialist Provider Number and will Call for Appointment Do you need transportation to your follow-up appointment?: No Do you understand care options if your condition(s) worsen?: Yes-patient verbalized understanding    SIGNATURE Darrall Ellison, LPN [facility] Nurse Health Advisor Direct Dial [phone]

## 2024-03-17 NOTE — Telephone Encounter (Signed)
 Called patient. Verified name and DOB. Relayed the following: Recommend that she start losartan  50 mg daily to help with BP. Continue metoprolol . thanks   Prescription sent to preferred pharmacy. Patient verbalized understanding.  Josie RN

## 2024-03-17 NOTE — Addendum Note (Signed)
 Addended by: Filbert Huff on: 03/17/2024 05:20 PM   Modules accepted: Orders

## 2024-03-17 NOTE — Telephone Encounter (Signed)
   Pt c/o of Chest Pain: STAT if active CP, including tightness, pressure, jaw pain, radiating pain to shoulder/upper arm/back, CP unrelieved by Nitro. Symptoms reported of SOB, nausea, vomiting, sweating.  1. Are you having CP right now? Discomfort in her upper back, shoulder and neck on the left side   2. Are you experiencing any other symptoms (ex. SOB, nausea, vomiting, sweating)? Nausea, mild shortness of breath, blood pressure 158/100 last reading   3. Is your CP continuous or coming and going? Stays but seem to intensify   4. Have you taken Nitroglycerin ? no   5. How long have you been experiencing CP? today    6. If NO CP at time of call then end call with telling Pt to call back or call 911 if Chest pain returns prior to return call from triage team.

## 2024-03-17 NOTE — Telephone Encounter (Signed)
 CARDIAC REHAB PHASE I   Unable to reach via phone. Will send post MI education materials to address on file. Referral for CRP2 sent to [facility].   Ronny Colas, RN BSN 03/17/2024 3:07 PM

## 2024-03-17 NOTE — Telephone Encounter (Signed)
 Spoke to patient. Verified name and DOB.  Patient states: - Feeling numbness on her left arm, L side of neck, and L shoulder, nausea, fatigue, and pain in   the center of her back. Reports mild SOB and constant pain in back that intensifies but does not go away.  Patient denies:  - Chest pain or pressure  Patient also reports high blood pressure reading of 155/100 this morning (HR 73). She started metoprolol  tartrate 25 mg tablet twice daily upon discharge on 03/16/2024. Patient mentioned amlodipine  and hydrochlorothiazide  were discontinued at discharge.  Patient reports symptoms are similar to those she felt while admitted. Told patient message would be sent to provider to review and advise. Instructed pt to go to ER if symptoms intensify. Patient verbalized understanding  Music therapist

## 2024-03-17 NOTE — Telephone Encounter (Signed)
 Recommend that she start losartan  50 mg daily to help with BP. Continue metoprolol . thanks

## 2024-03-18 ENCOUNTER — Telehealth: Payer: Self-pay | Admitting: Cardiovascular Disease

## 2024-03-18 NOTE — Telephone Encounter (Signed)
 Patient identification verified by 2 forms. Hilton Lucky, RN    Called and spoke to patient  Patient states:   -tooth extraction moved to Thursday instead of Saturday   -dentist believes she has had a infection for weeks   -needs cardiology clearance  Advised patient:   -fax clearance to [phone]  -Attn Pre-op  Patient has no further questions at this time

## 2024-03-18 NOTE — Telephone Encounter (Signed)
 New Message:      Patient says she has a tooth that has an infection in it and she is supposed to have it removed on Thursday(03-20-24) She wants to know if the doctor thinks it will be alright for her to have this tooth extracted on Thursday(03-20-24)?

## 2024-03-19 ENCOUNTER — Telehealth: Payer: Self-pay

## 2024-03-19 NOTE — Telephone Encounter (Signed)
 Harrold Lincoln, MD  You; Sims Duck R, RN10 hours ago (9:24 PM)    He was just admitted to the hospital with a stress-induced heart failure episode.  As long as they use local anesthesia I am fine for her to have the tooth pulled.  I do not believe she will be put to sleep for this so as this should be okay.  Melodee Spruce T. Rolm Clos, MD, [facility]  [facility] [address] [place], [place] [address] [phone] 9:24 PM  Patient identification verified by 2 forms. Hilton Lucky, RN    Called and spoke to patient  Relayed provider message  Patient verbalized understanding, no questions at this time

## 2024-03-19 NOTE — Telephone Encounter (Signed)
   Pre-operative Risk Assessment    Patient Name: Sherry Daniels  DOB: 01/30/1961 MRN: [id number]   Date of last office visit: 02/23/23 Lawana Pray, NP Date of next office visit: 04/01/24 Charles Connor, NP  Request for Surgical Clearance    Procedure:  Dental Extraction - Amount of Teeth to be Pulled:  1 TOOTH  Date of Surgery:  Clearance 03/20/24                                Surgeon:  NOT INDICATED Surgeon's Group or Practice Name:  [facility]  Phone number:  [phone] Fax number:  [phone]   Type of Clearance Requested:   - Medical  - Pharmacy:  Hold Aspirin      Type of Anesthesia:  Local    Additional requests/questions:    SignedCollin Deal   03/19/2024, 9:52 AM

## 2024-03-19 NOTE — Telephone Encounter (Signed)
    Primary Cardiologist: Oneil Bigness, MD  Chart reviewed as part of pre-operative protocol coverage. Simple dental extractions are considered low risk procedures per guidelines and generally do not require any specific cardiac clearance. It is also generally accepted that for simple extractions and dental cleanings, there is no need to interrupt blood thinner therapy.   Per Dr. Rolm Clos, "She was just admitted to the hospital with a stress-induced heart failure episode.  As long as they use local anesthesia I am fine for her to have the tooth pulled.  I do not believe she will be put to sleep for this so as this should be okay."  SBE prophylaxis is not required for the patient.  I will route this recommendation to the requesting party via Epic fax function and remove from pre-op pool.  Please call with questions.  Sherry Daniels D Sherry Cockerell, NP 03/19/2024, 10:06 AM

## 2024-03-21 ENCOUNTER — Telehealth: Payer: Self-pay | Admitting: Cardiovascular Disease

## 2024-03-21 DIAGNOSIS — I10 Essential (primary) hypertension: Secondary | ICD-10-CM

## 2024-03-21 NOTE — Telephone Encounter (Signed)
 Spoke to patient she stated she was recently in [place].Stated hydrochlorothiazide  was stopped due to low B/P.Stated she has been having swelling in both lower legs and abd.She is sob.She has not weighed,but her clothes feel tight.B/P at present 151/86 pulse 55.Spoke to DOD Dr.Schumann he advised ok to restart hydrochlorothiazide  25 mg daily.Bmet in 1 week.Keep appointment with Charles Connor NP 5/20 at 2:45 pm.

## 2024-03-21 NOTE — Telephone Encounter (Signed)
 Pt c/o BP issue: STAT if pt c/o blurred vision, one-sided weakness or slurred speech.  STAT if BP is GREATER than 180/120 TODAY.  STAT if BP is LESS than 90/60 and SYMPTOMATIC TODAY  1. What is your BP concern? Pt called in stating she is feeling some water retention in her abdomen and feet a little bit and her bp is still elevated. She states the Losartan  brought it down slightly but she would like to add Hydrochlorothiazide  to her regimen, please advise.   2. Have you taken any BP medication today? Yes   3. What are your last 5 BP readings? 140's/100's   4. Are you having any other symptoms (ex. Dizziness, headache, blurred vision, passed out)? Water retention in abdomen and feet

## 2024-03-25 ENCOUNTER — Ambulatory Visit: Payer: Self-pay | Admitting: Nurse Practitioner

## 2024-03-25 ENCOUNTER — Encounter: Payer: Self-pay | Admitting: Nurse Practitioner

## 2024-03-25 VITALS — BP 105/60 | HR 47 | Temp 97.0°F | Ht 66.0 in | Wt 167.0 lb

## 2024-03-25 DIAGNOSIS — Z8669 Personal history of other diseases of the nervous system and sense organs: Secondary | ICD-10-CM

## 2024-03-25 DIAGNOSIS — I5020 Unspecified systolic (congestive) heart failure: Secondary | ICD-10-CM

## 2024-03-25 DIAGNOSIS — M797 Fibromyalgia: Secondary | ICD-10-CM

## 2024-03-25 DIAGNOSIS — I10 Essential (primary) hypertension: Secondary | ICD-10-CM

## 2024-03-25 DIAGNOSIS — E782 Mixed hyperlipidemia: Secondary | ICD-10-CM

## 2024-03-25 DIAGNOSIS — I214 Non-ST elevation (NSTEMI) myocardial infarction: Secondary | ICD-10-CM

## 2024-03-25 DIAGNOSIS — J4520 Mild intermittent asthma, uncomplicated: Secondary | ICD-10-CM

## 2024-03-25 DIAGNOSIS — Z09 Encounter for follow-up examination after completed treatment for conditions other than malignant neoplasm: Secondary | ICD-10-CM

## 2024-03-25 DIAGNOSIS — R7303 Prediabetes: Secondary | ICD-10-CM | POA: Diagnosis not present

## 2024-03-25 DIAGNOSIS — D649 Anemia, unspecified: Secondary | ICD-10-CM

## 2024-03-25 DIAGNOSIS — I428 Other cardiomyopathies: Secondary | ICD-10-CM

## 2024-03-25 HISTORY — DX: Other cardiomyopathies: I42.8

## 2024-03-25 NOTE — Assessment & Plan Note (Signed)
 Continue hydrochlorothiazide  25 mg daily, metoprolol  25 mg twice daily, losartan  50 mg daily Encouraged to maintain close follow-up with cardiology

## 2024-03-25 NOTE — Assessment & Plan Note (Signed)
 Encouraged to maintain close follow-up with rheumatologist

## 2024-03-25 NOTE — Assessment & Plan Note (Signed)
 Lab Results  Component Value Date   CHOL 256 (H) 03/15/2024   HDL 87 03/15/2024   LDLCALC 156 (H) 03/15/2024   TRIG 67 03/15/2024   CHOLHDL 2.9 03/15/2024  Rechecking labs Needs to start a statin, she does not think that she has a true allergy to simvastatin

## 2024-03-25 NOTE — Assessment & Plan Note (Addendum)
 Continue metoprolol  25 mg twice daily, hydrochlorothiazide  25 mg daily, losartan  50 mg daily  Maintain Close follow-up with cardiology,

## 2024-03-25 NOTE — Assessment & Plan Note (Signed)
 Hospital chart reviewed, including discharge summary Medications reconciled and reviewed with the patient in detail

## 2024-03-25 NOTE — Assessment & Plan Note (Addendum)
 Followed by cardiology Continue aspirin  81 mg daily ,metoprolol  25 mg twice daily, losartan  50 mg daily, hydrochlorothiazide  25 mg daily Needs to be on a statin checking lipid panel

## 2024-03-25 NOTE — Progress Notes (Signed)
 Established Patient Office Visit  Subjective:  Patient ID: Sherry Daniels, female    DOB: 11-07-1961  Age: 63 y.o. MRN: [id number]  CC:  Chief Complaint  Patient presents with   Hospitalization Follow-up    HPI Sherry Daniels is a 63 y.o. female  has a past medical history of Allergy, Asthma, Chest pain (11/13/2009), Constipation, Fibromyalgia, GERD (gastroesophageal reflux disease), Hyperlipidemia, Hypertension, Migraine, Non-ischemic cardiomyopathy (HCC) (03/25/2024), and Nonalcoholic fatty liver disease.  Patient presents for hospitalization follow-up  She was on admission from 03/14/2024 to 03/16/2024 for NSTEMI nonischemic cardiomyopathy and systolic heart failure, she underwent cardiac catheterization which showed minimal nonobstructive CAD with no significant stenosis severe segmental left ventricular dysfunction with periapical following, hyperdynamic basal segments, left ventricular EF estimated at 35% findings consistent with acute Takotsubo syndrome, patient was started on metoprolol  25 mg twice daily, continued on losartan  50 mg daily.  Amlodipine  and hydrochlorothiazide  were held at discharge due to hypotension however hydrochlorothiazide  25 mg was recently restarted by cardiology due to complaints of lower extremity edema. Patient stated that she is feeling much better today still has a little intermittent shortness of breath and chest tightness, has upcoming appointment with cardiology next week      Past Medical History:  Diagnosis Date   Allergy    Asthma    Chest pain 11/13/2009   overnight hospitalization   Constipation    Dr. Tova Fresh   Fibromyalgia    GERD (gastroesophageal reflux disease)    Hyperlipidemia    Hypertension    Migraine    Non-ischemic cardiomyopathy (HCC) 03/25/2024   Nonalcoholic fatty liver disease     Past Surgical History:  Procedure Laterality Date   ABDOMINAL HYSTERECTOMY     total   COLONOSCOPY  09/15/2009   Dr.  Tova Fresh; normal   LEFT HEART CATH AND CORONARY ANGIOGRAPHY N/A 03/14/2024   Procedure: LEFT HEART CATH AND CORONARY ANGIOGRAPHY;  Surgeon: Arnoldo Lapping, MD;  Location: [facility] INVASIVE CV LAB;  Service: Cardiovascular;  Laterality: N/A;   WISDOM TOOTH EXTRACTION      Family History  Problem Relation Age of Onset   Cancer Mother        stage III, colon   Hypertension Mother    Dementia Mother    Heart disease Mother        bradycardia   Stroke Mother    Cancer Father        prostate   Hypertension Father    Hyperlipidemia Father    Heart disease Father 12       MI    Hyperlipidemia Sister    Hypertension Sister    Fibromyalgia Sister    Arthritis Brother        RA   Hypertension Brother    Hyperlipidemia Brother    Fibromyalgia Brother    Hypertension Brother    Hyperlipidemia Brother    Healthy Daughter    Healthy Son    Lupus Niece     Social History   Socioeconomic History   Marital status: Married    Spouse name: Not on file   Number of children: 2   Years of education: Not on file   Highest education level: Not on file  Occupational History   Occupation: supervise principles in school system    Employer: [employer]  Tobacco Use   Smoking status: Never    Passive exposure: Never   Smokeless tobacco: Never  Vaping Use   Vaping status: Never Used  Substance and Sexual Activity   Alcohol use: No    Alcohol/week: 0.0 standard drinks of alcohol   Drug use: No   Sexual activity: Not on file  Other Topics Concern   Not on file  Social History Narrative   Married, 1 daughter and 1 son, pentecostal, exercise most days, walking, biking, running.    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 [employer]   Social Drivers of Health   Financial Resource Strain: Not on file  Food Insecurity: No Food Insecurity (03/14/2024)   Hunger Vital Sign    Worried About Running Out of Food in the Last Year: Never true    Ran Out of Food in the Last Year: Never true  Transportation  Needs: No Transportation Needs (03/15/2024)   PRAPARE - Administrator, Civil Service (Medical): No    Lack of Transportation (Non-Medical): No  Physical Activity: Not on file  Stress: Not on file  Social Connections: Not on file  Intimate Partner Violence: Not At Risk (03/15/2024)   Humiliation, Afraid, Rape, and Kick questionnaire    Fear of Current or Ex-Partner: No    Emotionally Abused: No    Physically Abused: No    Sexually Abused: No    Outpatient Medications Prior to Visit  Medication Sig Dispense Refill   acetaminophen  (TYLENOL ) 500 MG tablet Take 1,500 mg by mouth every 6 (six) hours as needed for mild pain (pain score 1-3). Reported on 05/01/2016     albuterol  (PROVENTIL ) (2.5 MG/3ML) 0.083% nebulizer solution Take 3 mLs (2.5 mg total) by nebulization every 6 (six) hours as needed for wheezing or shortness of breath. 150 mL 1   albuterol  (VENTOLIN  HFA) 108 (90 Base) MCG/ACT inhaler USE 2 PUFFS EVERY 4 TO 6 HOURS AS NEEDED 18 g 2   aspirin  81 MG EC tablet 1 tablet po qOD (Patient taking differently: Take 81 mg by mouth daily.) 30 tablet 12   estradiol (ESTRACE) 0.1 MG/GM vaginal cream Place 1 g vaginally 3 (three) times a week. Apply 1 application on Monday, Wednesdays, and Fridays per patient     estradiol (VIVELLE-DOT) 0.1 MG/24HR patch Place 1 patch onto the skin 2 (two) times a week.     hydrochlorothiazide  (HYDRODIURIL ) 25 MG tablet Take 1 tablet (25 mg total) by mouth daily.     losartan  (COZAAR ) 50 MG tablet Take 1 tablet (50 mg total) by mouth daily. 90 tablet 3   metoprolol  tartrate (LOPRESSOR ) 25 MG tablet Take 1 tablet (25 mg total) by mouth 2 (two) times daily. 60 tablet 1   Misc Natural Products (TART CHERRY ADVANCED PO) Take 1 tablet by mouth daily.     Multiple Vitamins-Minerals (MULTIVITAMIN WITH MINERALS) tablet Take 1 tablet by mouth daily.     Omega-3 1000 MG CAPS Take 1 capsule by mouth daily.     promethazine  (PHENERGAN ) 12.5 MG tablet Take 1 tablet  (12.5 mg total) by mouth every 8 (eight) hours as needed for nausea or vomiting. 20 tablet 0   SUMAtriptan  (IMITREX ) 50 MG tablet Take 1 tablet (50 mg total) by mouth 2 (two) times daily as needed for migraine. May repeat in 2 hours if headache persists or recurs. 20 tablet 0   TURMERIC PO Take by mouth.     Vitamin D , Ergocalciferol , (DRISDOL ) 1.25 MG (50000 UNIT) CAPS capsule Take 1 capsule (50,000 Units total) by mouth every 7 (seven) days. 12 capsule 0   No facility-administered medications prior to visit.    Allergies  Allergen Reactions   Aspirin  Nausea Only   Codeine Nausea Only   Cymbalta  [Duloxetine  Hcl]     Felt like it worsened anxiety and fibromyalgia   Simvastatin Other (See Comments)    dizziness    ROS Review of Systems  Constitutional:  Negative for appetite change, chills, fatigue and fever.  HENT:  Negative for congestion, postnasal drip, rhinorrhea and sneezing.   Respiratory:  Negative for cough, shortness of breath and wheezing.   Cardiovascular:  Negative for chest pain, palpitations and leg swelling.  Gastrointestinal:  Negative for abdominal pain, constipation, nausea and vomiting.  Genitourinary:  Negative for difficulty urinating, dysuria, flank pain and frequency.  Musculoskeletal:  Negative for gait problem, joint swelling and myalgias.  Skin:  Negative for color change, pallor, rash and wound.  Neurological:  Negative for dizziness, facial asymmetry, weakness and numbness.  Psychiatric/Behavioral:  Negative for behavioral problems, confusion, self-injury and suicidal ideas.       Objective:     Physical Exam Vitals and nursing note reviewed.  Constitutional:      General: She is not in acute distress.    Appearance: Normal appearance. She is not ill-appearing, toxic-appearing or diaphoretic.  Eyes:     General: No scleral icterus.       Right eye: No discharge.        Left eye: No discharge.     Extraocular Movements: Extraocular movements  intact.     Conjunctiva/sclera: Conjunctivae normal.  Cardiovascular:     Rate and Rhythm: Normal rate and regular rhythm.     Pulses: Normal pulses.     Heart sounds: Normal heart sounds. No murmur heard.    No friction rub. No gallop.  Pulmonary:     Effort: Pulmonary effort is normal. No respiratory distress.     Breath sounds: Normal breath sounds. No stridor. No wheezing, rhonchi or rales.  Chest:     Chest wall: No tenderness.  Abdominal:     General: There is no distension.     Palpations: Abdomen is soft.     Tenderness: There is no abdominal tenderness. There is no right CVA tenderness, left CVA tenderness or guarding.  Musculoskeletal:        General: No swelling, deformity or signs of injury.     Right lower leg: No edema.     Left lower leg: No edema.  Skin:    General: Skin is warm and dry.     Capillary Refill: Capillary refill takes less than 2 seconds.     Coloration: Skin is not jaundiced or pale.     Findings: No bruising, erythema or lesion.  Neurological:     Mental Status: She is alert and oriented to person, place, and time.     Motor: No weakness.     Coordination: Coordination normal.     Gait: Gait normal.  Psychiatric:        Mood and Affect: Mood normal.        Behavior: Behavior normal.        Thought Content: Thought content normal.        Judgment: Judgment normal.     BP 105/60   Pulse (!) 47   Temp (!) 97 F (36.1 C)   Ht 5\' 6"  (1.676 m)   Wt 167 lb (75.8 kg)   SpO2 100%   BMI 26.95 kg/m  Wt Readings from Last 3 Encounters:  03/25/24 167 lb (75.8 kg)  03/14/24 169 lb (76.7 kg)  02/25/24 169 lb 3.2 oz (76.7 kg)    Lab Results  Component Value Date   TSH 0.99 02/25/2024   Lab Results  Component Value Date   WBC 4.2 03/16/2024   HGB 10.8 (L) 03/16/2024   HCT 33.7 (L) 03/16/2024   MCV 91.3 03/16/2024   PLT 220 03/16/2024   Lab Results  Component Value Date   NA 138 03/16/2024   K 3.8 03/16/2024   CO2 22 03/16/2024    GLUCOSE 100 (H) 03/16/2024   BUN 10 03/16/2024   CREATININE 0.80 03/16/2024   BILITOT 0.8 03/15/2024   ALKPHOS 57 03/15/2024   AST 42 (H) 03/15/2024   ALT 38 03/15/2024   PROT 7.0 03/15/2024   ALBUMIN 3.2 (L) 03/15/2024   CALCIUM  8.6 (L) 03/16/2024   ANIONGAP 6 03/16/2024   EGFR 101 02/25/2024   Lab Results  Component Value Date   CHOL 256 (H) 03/15/2024   Lab Results  Component Value Date   HDL 87 03/15/2024   Lab Results  Component Value Date   LDLCALC 156 (H) 03/15/2024   Lab Results  Component Value Date   TRIG 67 03/15/2024   Lab Results  Component Value Date   CHOLHDL 2.9 03/15/2024   Lab Results  Component Value Date   HGBA1C 5.7 (H) 03/15/2024      Assessment & Plan:   Problem List Items Addressed This Visit       Cardiovascular and Mediastinum   Essential hypertension   BP Readings from Last 3 Encounters:  03/25/24 105/60  03/16/24 115/74  02/25/24 (!) 152/91   HTN Controlled .  On losartan  50 mg daily, hydrochlorothiazide  25 mg daily, metoprolol  25 mg twice daily Continue current medications. No changes in management. Discussed DASH diet and dietary sodium restrictions Continue to increase dietary efforts and exercise.         NSTEMI (non-ST elevated myocardial infarction) (HCC) - Primary   Followed by cardiology Continue aspirin  81 mg daily ,metoprolol  25 mg twice daily, losartan  50 mg daily, hydrochlorothiazide  25 mg daily Needs to be on a statin checking lipid panel      Non-ischemic cardiomyopathy (HCC)   Continue metoprolol  25 mg twice daily, hydrochlorothiazide  25 mg daily, losartan  50 mg daily  Maintain Close follow-up with cardiology,       Systolic heart failure (HCC)   Continue hydrochlorothiazide  25 mg daily, metoprolol  25 mg twice daily, losartan  50 mg daily Encouraged to maintain close follow-up with cardiology        Respiratory   Mild intermittent asthma, uncomplicated   Continue albuterol  inhaler and albuterol   nebulizer as needed        Other   Mixed hyperlipidemia   Lab Results  Component Value Date   CHOL 256 (H) 03/15/2024   HDL 87 03/15/2024   LDLCALC 156 (H) 03/15/2024   TRIG 67 03/15/2024   CHOLHDL 2.9 03/15/2024  Rechecking labs Needs to start a statin, she does not think that she has a true allergy to simvastatin      Relevant Orders   Lipid panel   Fibromyalgia   Encouraged to maintain close follow-up with rheumatologist      Anemia   Lab Results  Component Value Date   WBC 4.2 03/16/2024   HGB 10.8 (L) 03/16/2024   HCT 33.7 (L) 03/16/2024   MCV 91.3 03/16/2024   PLT 220 03/16/2024  Rechecking labs      Relevant Orders   CBC   History of migraine  Continue Imitrex  and promethazine  as needed      Prediabetes   Lab Results  Component Value Date   HGBA1C 5.7 (H) 03/15/2024  Patient counseled on low-carb diet      Hospital discharge follow-up   Hospital chart reviewed, including discharge summary Medications reconciled and reviewed with the patient in detail        No orders of the defined types were placed in this encounter.   Follow-up: Return in about 4 months (around 07/26/2024) for HTN.    Mack Alvidrez R Javyon Fontan, FNP

## 2024-03-25 NOTE — Assessment & Plan Note (Signed)
 Continue albuterol  inhaler and albuterol  nebulizer as needed

## 2024-03-25 NOTE — Assessment & Plan Note (Signed)
 Lab Results  Component Value Date   WBC 4.2 03/16/2024   HGB 10.8 (L) 03/16/2024   HCT 33.7 (L) 03/16/2024   MCV 91.3 03/16/2024   PLT 220 03/16/2024  Rechecking labs

## 2024-03-25 NOTE — Assessment & Plan Note (Signed)
 Continue Imitrex  and promethazine  as needed

## 2024-03-25 NOTE — Telephone Encounter (Signed)
 This RN acknowledges provider response and recommendations.  Encounter closed from this in basket.

## 2024-03-25 NOTE — Patient Instructions (Signed)
 Please consider getting Shingrix and Tdap  and pneumococcal vaccine      It is important that you exercise regularly at least 30 minutes 5 times a week as tolerated  Think about what you will eat, plan ahead. Choose " clean, green, fresh or frozen" over canned, processed or packaged foods which are more sugary, salty and fatty. 70 to 75% of food eaten should be vegetables and fruit. Three meals at set times with snacks allowed between meals, but they must be fruit or vegetables. Aim to eat over a 12 hour period , example 7 am to 7 pm, and STOP after  your last meal of the day. Drink water,generally about 64 ounces per day, no other drink is as healthy. Fruit juice is best enjoyed in a healthy way, by EATING the fruit.  Thanks for choosing [facility] we consider it a privelige to serve you.

## 2024-03-25 NOTE — Assessment & Plan Note (Signed)
 BP Readings from Last 3 Encounters:  03/25/24 105/60  03/16/24 115/74  02/25/24 (!) 152/91   HTN Controlled .  On losartan  50 mg daily, hydrochlorothiazide  25 mg daily, metoprolol  25 mg twice daily Continue current medications. No changes in management. Discussed DASH diet and dietary sodium restrictions Continue to increase dietary efforts and exercise.

## 2024-03-25 NOTE — Assessment & Plan Note (Signed)
 Lab Results  Component Value Date   HGBA1C 5.7 (H) 03/15/2024  Patient counseled on low-carb diet

## 2024-03-26 ENCOUNTER — Ambulatory Visit: Payer: Self-pay | Admitting: Nurse Practitioner

## 2024-03-26 ENCOUNTER — Other Ambulatory Visit: Payer: Self-pay | Admitting: Nurse Practitioner

## 2024-03-26 DIAGNOSIS — E782 Mixed hyperlipidemia: Secondary | ICD-10-CM

## 2024-03-26 DIAGNOSIS — I214 Non-ST elevation (NSTEMI) myocardial infarction: Secondary | ICD-10-CM

## 2024-03-26 LAB — LIPID PANEL
Chol/HDL Ratio: 3 ratio (ref 0.0–4.4)
Cholesterol, Total: 234 mg/dL — ABNORMAL HIGH (ref 100–199)
HDL: 77 mg/dL (ref >39)
LDL Chol Calc (NIH): 139 mg/dL — ABNORMAL HIGH (ref 0–99)
Triglycerides: 102 mg/dL (ref 0–149)
VLDL Cholesterol Cal: 18 mg/dL (ref 5–40)

## 2024-03-26 LAB — CBC
Hematocrit: 39.7 % (ref 34.0–46.6)
Hemoglobin: 13 g/dL (ref 11.1–15.9)
MCH: 29.4 pg (ref 26.6–33.0)
MCHC: 32.7 g/dL (ref 31.5–35.7)
MCV: 90 fL (ref 79–97)
Platelets: 251 10*3/uL (ref 150–450)
RBC: 4.42 x10E6/uL (ref 3.77–5.28)
RDW: 13.2 % (ref 11.7–15.4)
WBC: 3.5 10*3/uL (ref 3.4–10.8)

## 2024-03-26 MED ORDER — ROSUVASTATIN CALCIUM 20 MG PO TABS: 20.0000 mg | ORAL_TABLET | Freq: Every day | ORAL | 1 refills | Status: DC

## 2024-03-31 NOTE — Progress Notes (Signed)
 Cardiology Office Note    Patient Name: Sherry Daniels Date of Encounter: 04/01/2024  Primary Care Provider:  Paseda, Folashade R, FNP Primary Cardiologist:  Oneil Bigness, MD Primary Electrophysiologist: None   Past Medical History    Past Medical History:  Diagnosis Date   Allergy    Asthma    Chest pain 11/13/2009   overnight hospitalization   Constipation    Dr. Tova Fresh   Fibromyalgia    GERD (gastroesophageal reflux disease)    Hyperlipidemia    Hypertension    Migraine    Non-ischemic cardiomyopathy (HCC) 03/25/2024   Nonalcoholic fatty liver disease     History of Present Illness  Sherry Daniels is a 63 y.o. female with a PMH of CAD s/p NSTEMI with LHC showing nonobstructive disease, HFpEF, NICM, HTN, HLD, NAFLD, mild intermittent asthma, GERD, Sjogren's disease presents today for posthospital follow-up.  Sherry Daniels was seen initially by Dr. Rolm Clos on 09/05/2021 by referral of PCP for complaint of chest tightness.  She also endorsed episodes of palpitations and wore 7-day event monitor that showed no arrhythmia with rare 50.  She underwent a coronary CTA that showed calcium  score of 0.  She was seen in the ED on 02/11/2023 with chest discomfort that she described as intermittent with slight diaphoresis.  She was noted to have flat troponins and EKG showed no evidence of ACS.  She was seen in the ED on 03/14/2024 with complaint of chest tightness in the center of the sternum.  Initial BP was elevated at 160/88 and at bedtime troponins were trending upward at [phone].  She completed a CT of the chest that showed no evidence of PE or acute abnormalities.  She was started on heparin  drip and given aspirin  324 mg and transferred to [facility].  She underwent a LHC that showed nonobstructive CAD but was consistent with Takotsubo cardiomyopathy.  She was noted to have soft BP and near syncopal episode fluids started and losartan  was discontinued with long-acting  metoprolol  switched to short acting.  She underwent TTE that showed reduced EF of 40-45% with apical akinesis left ventricular hypertrophy, mild MVR.  Losartan  was continued to be held to allow permissive hypertension and increase in afterload with plan to reinitiate as outpatient.  She was seen by PCP on 03/25/2024 and reported feeling better with intermittent shortness of breath and chest tightness.  Sherry Daniels presents today for hospital follow-up.  She reports since her hospitalization experiencing intermittent shortness of breath and chest tightness, with her heart rate trending low, often in the mid to low 40s, and sometimes reaching 50 bpm. She notes dizziness, particularly when moving from sitting to standing, which she attributes to the low heart rate. Her blood pressure was elevated upon discharge, and she resumed losartan . She is currently on metoprolol  50 mg, hydrochlorothiazide , and losartan , which has helped stabilize her blood pressure. She experiences occasional elevated blood pressure before her evening dose of metoprolol . Her siblings, particularly her sister, are unable to tolerate beta blockers. She mentions a fluctuating appetite and acknowledges needing to improve her hydration. She is attempting to follow a Mediterranean diet and aims to drink 64 ounces of water daily. Patient denies chest pain, palpitations, dyspnea, PND, orthopnea, nausea, vomiting, dizziness, syncope, edema, weight gain, or early satiety.  Discussed the use of AI scribe software for clinical note transcription with the patient, who gave verbal consent to proceed.  History of Present Illness   Review of Systems  Please see the  history of present illness.    All other systems reviewed and are otherwise negative except as noted above.  Physical Exam     Wt Readings from Last 3 Encounters:  04/01/24 166 lb 9.6 oz (75.6 kg)  03/25/24 167 lb (75.8 kg)  03/14/24 169 lb (76.7 kg)   VS: Vitals:   04/01/24 1455   BP: 116/70  Pulse: (!) 50  SpO2: 98%  ,Body mass index is 26.89 kg/m. GEN: Well nourished, well developed in no acute distress Neck: No JVD; No carotid bruits Pulmonary: Clear to auscultation without rales, wheezing or rhonchi  Cardiovascular: Normal rate. Regular rhythm. Normal S1. Normal S2.   Murmurs: There is no murmur.  ABDOMEN: Soft, non-tender, non-distended EXTREMITIES:  No edema; No deformity   EKG/LABS/ Recent Cardiac Studies   ECG personally reviewed by me today -none completed today  Risk Assessment/Calculations:          Lab Results  Component Value Date   WBC 3.5 03/25/2024   HGB 13.0 03/25/2024   HCT 39.7 03/25/2024   MCV 90 03/25/2024   PLT 251 03/25/2024   Lab Results  Component Value Date   CREATININE 0.75 03/31/2024   BUN 14 03/31/2024   NA 141 03/31/2024   K 4.0 03/31/2024   CL 102 03/31/2024   CO2 22 03/31/2024   Lab Results  Component Value Date   CHOL 234 (H) 03/25/2024   HDL 77 03/25/2024   LDLCALC 139 (H) 03/25/2024   TRIG 102 03/25/2024   CHOLHDL 3.0 03/25/2024    Lab Results  Component Value Date   HGBA1C 5.7 (H) 03/15/2024   Assessment & Plan   Assessment & Plan  1.  CAD/NSTEMI -s/p NSTEMI with LHC showing nonobstructive disease and today reports doing well with no current chest pain or shortness of breath. - Monitor for recurrent chest pain or discomfort. - Consider PET stress test if symptoms recur. - Consider antianginal medication if needed. -Continue ASA 81 mg, metoprolol  12.5 mg twice daily, Crestor  20 mg daily - Patient is cleared to proceed with cardiac rehab  2.  HFrEF/NICM TTE with reduced ejection fraction of 40-45%. Current treatment includes losartan  and metoprolol . Discussed potential for improvement with medication and exercise. Cardiac rehabilitation recommended. - Refer to cardiac rehabilitation. - Reduce metoprolol  to 12.5 mg twice daily. - Recheck echocardiogram in 3 months. - Encourage exercise, avoid  strenuous activity. - Monitor symptoms and blood pressure.  3.  Essential hypertension: - Patient's blood pressure today was stable at 116/70 - Continue HCTZ 25 mg, losartan  50 mg, metoprolol  12.5 mg twice daily  4.  Hyperlipidemia: - LDL cholesterol was 139 on 03/25/2024 - Patient currently on Crestor  20 mg with LFT and lipids to be rechecked per PCP  5.  Hypotension: Orthostatic hypotension likely related to beta-blocker use, causing dizziness upon standing. - Reduce metoprolol  to 12.5 mg twice daily. - Monitor for symptoms of dizziness and orthostatic hypotension.  6.  Bradycardia: -Bradycardia with heart rate in the mid to low 40s, likely due to excessive beta-blocker effect. Symptoms include dizziness and near syncope. Family history of beta-blocker intolerance. Discussed potential discontinuation of metoprolol  if bradycardia persists. - Reduce metoprolol  to 12.5 mg twice daily. - Monitor heart rate and symptoms. - Consider discontinuing metoprolol  if bradycardia persists.    Cardiac Rehabilitation Eligibility Assessment  The patient is ready to start cardiac rehabilitation from a cardiac standpoint.     Disposition: Follow-up with Oneil Bigness, MD or APP in 3 months  Signed, Francene Ing, Retha Cast, NP 04/01/2024, 6:58 PM  [facility]

## 2024-04-01 ENCOUNTER — Encounter: Payer: Self-pay | Admitting: Nurse Practitioner

## 2024-04-01 ENCOUNTER — Ambulatory Visit: Payer: Self-pay | Admitting: Cardiovascular Disease

## 2024-04-01 ENCOUNTER — Ambulatory Visit: Attending: Nurse Practitioner | Admitting: Nurse Practitioner

## 2024-04-01 VITALS — BP 116/70 | HR 50 | Ht 66.0 in | Wt 166.6 lb

## 2024-04-01 DIAGNOSIS — I2510 Atherosclerotic heart disease of native coronary artery without angina pectoris: Secondary | ICD-10-CM | POA: Diagnosis not present

## 2024-04-01 DIAGNOSIS — I5020 Unspecified systolic (congestive) heart failure: Secondary | ICD-10-CM

## 2024-04-01 DIAGNOSIS — I10 Essential (primary) hypertension: Secondary | ICD-10-CM

## 2024-04-01 DIAGNOSIS — E782 Mixed hyperlipidemia: Secondary | ICD-10-CM | POA: Diagnosis not present

## 2024-04-01 DIAGNOSIS — I428 Other cardiomyopathies: Secondary | ICD-10-CM | POA: Diagnosis not present

## 2024-04-01 DIAGNOSIS — R001 Bradycardia, unspecified: Secondary | ICD-10-CM

## 2024-04-01 LAB — BASIC METABOLIC PANEL WITH GFR
BUN/Creatinine Ratio: 19 (ref 12–28)
BUN: 14 mg/dL (ref 8–27)
CO2: 22 mmol/L (ref 20–29)
Calcium: 9.5 mg/dL (ref 8.7–10.3)
Chloride: 102 mmol/L (ref 96–106)
Creatinine, Ser: 0.75 mg/dL (ref 0.57–1.00)
Glucose: 89 mg/dL (ref 70–99)
Potassium: 4 mmol/L (ref 3.5–5.2)
Sodium: 141 mmol/L (ref 134–144)
eGFR: 91 mL/min/{1.73_m2} (ref >59)

## 2024-04-01 MED ORDER — METOPROLOL TARTRATE 25 MG PO TABS: 12.5000 mg | ORAL_TABLET | Freq: Two times a day (BID) | ORAL | 3 refills | Status: DC

## 2024-04-01 NOTE — Patient Instructions (Addendum)
 Medication Instructions:  DECREASE Metoprolol  to 12.5mg  Take 1 tablet twice a day (tablet comes in 25mg  break in half and take half tablet daily) TAKE LOSARTAN  IN THE EVENINGS TAKE HYDROCHLOROTHIAZIDE  IN THE MORNINGS  *If you need a refill on your cardiac medications before your next appointment, please call your pharmacy*  Lab Work: 7 WEEKS FASTING LIPIDS & LFTS (05/20/2024) If you have labs (blood work) drawn today and your tests are completely normal, you will receive your results only by: MyChart Message (if you have MyChart) OR A paper copy in the mail If you have any lab test that is abnormal or we need to change your treatment, we will call you to review the results.  Testing/Procedures: NONE ORDERED  Follow-Up: At [facility], you and your health needs are our priority.  As part of our continuing mission to provide you with exceptional heart care, our providers are all part of one team.  This team includes your primary Cardiologist (physician) and Advanced Practice Providers or APPs (Physician Assistants and Nurse Practitioners) who all work together to provide you with the care you need, when you need it.  Your next appointment:   3 month(s)  Provider:   Oneil Bigness, MD or APP on his team  We recommend signing up for the patient portal called "MyChart".  Sign up information is provided on this After Visit Summary.  MyChart is used to connect with patients for Virtual Visits (Telemedicine).  Patients are able to view lab/test results, encounter notes, upcoming appointments, etc.  Non-urgent messages can be sent to your provider as well.   To learn more about what you can do with MyChart, go to [email or web address].   Other Instructions

## 2024-04-04 ENCOUNTER — Ambulatory Visit: Admitting: General Practice

## 2024-04-10 ENCOUNTER — Encounter (HOSPITAL_COMMUNITY): Payer: Self-pay

## 2024-04-10 ENCOUNTER — Telehealth (HOSPITAL_COMMUNITY): Payer: Self-pay

## 2024-04-10 NOTE — Telephone Encounter (Signed)
 Pt insurance is active and benefits verified through [employer] Co-pay 0, DED $1,500/$1,500 met, out of pocket $5,900/$5,900 met, co-insurance 30%. no pre-authorization required, [employer] 04/10/2024@1 :21, REF# [id number]   How many CR sessions are covered? (ICR)no limit Is this a lifetime maximum or an annual maximum? Annual Has the member used any of these services to date? no Is there a time limit (weeks/months) on start of program and/or program completion? no

## 2024-04-23 ENCOUNTER — Telehealth (HOSPITAL_COMMUNITY): Payer: Self-pay

## 2024-04-23 NOTE — Telephone Encounter (Signed)
 Pt returned CR phone call and stated she is interested. Patient will come in for orientation on 05/12/24 @ 1:15PM and will attend the 1:45PM exercise class.   Pensions consultant.

## 2024-04-30 NOTE — Telephone Encounter (Signed)
 Patient identification verified by 2 forms. Sherry Duck, RN    message from Dr. Elodia Hailstone has been released to MyChart. It has been viewed by patient at this time.  Last read by Sherry Daniels at 8:19PM on 04/28/2024.

## 2024-05-12 ENCOUNTER — Encounter (HOSPITAL_COMMUNITY)
Admission: RE | Admit: 2024-05-12 | Discharge: 2024-05-12 | Disposition: A | Discharge status: Home / Self Care | Source: Ambulatory Visit | Attending: Cardiovascular Disease | Admitting: Cardiovascular Disease

## 2024-05-12 VITALS — BP 118/68 | HR 66 | Ht 66.0 in | Wt 170.2 lb

## 2024-05-12 DIAGNOSIS — I214 Non-ST elevation (NSTEMI) myocardial infarction: Secondary | ICD-10-CM | POA: Insufficient documentation

## 2024-05-12 NOTE — Progress Notes (Signed)
 Cardiac Rehab Medication Review   Does the patient  feel that his/her medications are working for him/her? Yes    Has the patient been experiencing any side effects to the medications prescribed? No  Does the patient measure his/her own blood pressure or blood glucose at home?   Yes  Does the patient have any problems obtaining medications due to transportation or finances?   No  Understanding of regimen: excellent Understanding of indications: excellent Potential of compliance: excellent    Comments: Sherry Daniels understands her medications and regime well. She checks her BP several times a day.    Con KATHEE Pereyra, MS, ACSM-CEP 05/12/2024 1:28 PM

## 2024-05-12 NOTE — Progress Notes (Signed)
 Cardiac Rehab Orientation Note  Patient Details  Name: Sherry Daniels MRN: [id number] Date of Birth: 01-05-1961 Referring Provider:  Dr. Darryle Decent  Berwyn Casimir Czajkowski did not complete her today. Pt c/o 8# weight gain over last 2 weeks. Increased abdominal tightness and shortness of breath as well as chest tightness at times. Lungs clear, no edema to extremities. Pt also reported she stopped metoprolol  on 6/16, thinks this is why she might be gaining weight. Discussed with Jackee Wyn Raddle, NP on site today. He recommends making appointment at office before resuming CR. Informed pt and she agrees with plan. Will schedule walk test in future.

## 2024-05-13 ENCOUNTER — Emergency Department (HOSPITAL_COMMUNITY)

## 2024-05-13 ENCOUNTER — Other Ambulatory Visit: Payer: Self-pay

## 2024-05-13 ENCOUNTER — Emergency Department (HOSPITAL_COMMUNITY)
Admission: EM | Admit: 2024-05-13 | Discharge: 2024-05-13 | Disposition: A | Discharge status: Home / Self Care | Attending: Emergency Medicine | Admitting: Emergency Medicine

## 2024-05-13 ENCOUNTER — Encounter (HOSPITAL_COMMUNITY): Payer: Self-pay | Admitting: Emergency Medicine

## 2024-05-13 DIAGNOSIS — Z79899 Other long term (current) drug therapy: Secondary | ICD-10-CM | POA: Diagnosis not present

## 2024-05-13 DIAGNOSIS — I10 Essential (primary) hypertension: Secondary | ICD-10-CM | POA: Diagnosis not present

## 2024-05-13 DIAGNOSIS — J45909 Unspecified asthma, uncomplicated: Secondary | ICD-10-CM | POA: Insufficient documentation

## 2024-05-13 DIAGNOSIS — Z7982 Long term (current) use of aspirin: Secondary | ICD-10-CM | POA: Insufficient documentation

## 2024-05-13 DIAGNOSIS — F419 Anxiety disorder, unspecified: Secondary | ICD-10-CM | POA: Diagnosis not present

## 2024-05-13 DIAGNOSIS — E875 Hyperkalemia: Secondary | ICD-10-CM | POA: Insufficient documentation

## 2024-05-13 DIAGNOSIS — R079 Chest pain, unspecified: Secondary | ICD-10-CM | POA: Insufficient documentation

## 2024-05-13 DIAGNOSIS — D72819 Decreased white blood cell count, unspecified: Secondary | ICD-10-CM | POA: Insufficient documentation

## 2024-05-13 DIAGNOSIS — R29818 Other symptoms and signs involving the nervous system: Secondary | ICD-10-CM | POA: Diagnosis not present

## 2024-05-13 LAB — CBC WITH DIFFERENTIAL/PLATELET
Abs Immature Granulocytes: 0 K/uL (ref 0.00–0.07)
Basophils Absolute: 0 K/uL (ref 0.0–0.1)
Basophils Relative: 1 %
Eosinophils Absolute: 0.1 K/uL (ref 0.0–0.5)
Eosinophils Relative: 4 %
HCT: 37.8 % (ref 36.0–46.0)
Hemoglobin: 12.3 g/dL (ref 12.0–15.0)
Immature Granulocytes: 0 %
Lymphocytes Relative: 46 %
Lymphs Abs: 1.7 K/uL (ref 0.7–4.0)
MCH: 29.7 pg (ref 26.0–34.0)
MCHC: 32.5 g/dL (ref 30.0–36.0)
MCV: 91.3 fL (ref 80.0–100.0)
Monocytes Absolute: 0.3 K/uL (ref 0.1–1.0)
Monocytes Relative: 8 %
Neutro Abs: 1.5 K/uL — ABNORMAL LOW (ref 1.7–7.7)
Neutrophils Relative %: 41 %
Platelets: 215 K/uL (ref 150–400)
RBC: 4.14 MIL/uL (ref 3.87–5.11)
RDW: 12.8 % (ref 11.5–15.5)
WBC: 3.6 K/uL — ABNORMAL LOW (ref 4.0–10.5)
nRBC: 0 % (ref 0.0–0.2)

## 2024-05-13 LAB — CBC
HCT: 38.4 % (ref 36.0–46.0)
Hemoglobin: 12.4 g/dL (ref 12.0–15.0)
MCH: 29.5 pg (ref 26.0–34.0)
MCHC: 32.3 g/dL (ref 30.0–36.0)
MCV: 91.2 fL (ref 80.0–100.0)
Platelets: 212 10*3/uL (ref 150–400)
RBC: 4.21 MIL/uL (ref 3.87–5.11)
RDW: 12.8 % (ref 11.5–15.5)
WBC: 3.7 10*3/uL — ABNORMAL LOW (ref 4.0–10.5)
nRBC: 0 % (ref 0.0–0.2)

## 2024-05-13 LAB — ETHANOL: Alcohol, Ethyl (B): <15 mg/dL (ref <15)

## 2024-05-13 LAB — I-STAT CHEM 8, ED
BUN: 13 mg/dL (ref 6–20)
Calcium, Ion: 1.11 mmol/L — ABNORMAL LOW (ref 1.15–1.40)
Chloride: 101 mmol/L (ref 98–111)
Creatinine, Ser: 0.7 mg/dL (ref 0.44–1.00)
Glucose, Bld: 98 mg/dL (ref 70–99)
HCT: 40 % (ref 36.0–46.0)
Hemoglobin: 13.6 g/dL (ref 12.0–15.0)
Potassium: 5.8 mmol/L — ABNORMAL HIGH (ref 3.5–5.1)
Sodium: 137 mmol/L (ref 135–145)
TCO2: 30 mmol/L (ref 22–32)

## 2024-05-13 LAB — BASIC METABOLIC PANEL WITH GFR
Anion gap: 9 (ref 5–15)
BUN: 10 mg/dL (ref 6–20)
CO2: 26 mmol/L (ref 22–32)
Calcium: 9.3 mg/dL (ref 8.9–10.3)
Chloride: 102 mmol/L (ref 98–111)
Creatinine, Ser: 0.71 mg/dL (ref 0.44–1.00)
GFR, Estimated: >60 mL/min (ref >60)
Glucose, Bld: 106 mg/dL — ABNORMAL HIGH (ref 70–99)
Potassium: 3.6 mmol/L (ref 3.5–5.1)
Sodium: 137 mmol/L (ref 135–145)

## 2024-05-13 LAB — PROTIME-INR
INR: 1 (ref 0.8–1.2)
Prothrombin Time: 13.4 s (ref 11.4–15.2)

## 2024-05-13 LAB — TROPONIN I (HIGH SENSITIVITY)
Troponin I (High Sensitivity): 3 ng/L (ref <18)
Troponin I (High Sensitivity): 4 ng/L (ref <18)

## 2024-05-13 LAB — APTT: aPTT: 29 s (ref 24–36)

## 2024-05-13 MED ORDER — IOHEXOL 350 MG/ML SOLN
75.0000 mL | Freq: Once | INTRAVENOUS | Status: AC | PRN
  Administered 2024-05-13: 75 mL via INTRAVENOUS

## 2024-05-13 NOTE — ED Provider Notes (Signed)
 [place] EMERGENCY DEPARTMENT AT [facility] Provider Note  CSN: [id number] Arrival date & time: 05/13/24 1216  Chief Complaint(s) Chest Pain  HPI Sherry Daniels is a 63 y.o. female who presented to the emergency room today due to a period of numbness, burning on the left side of her face, neck and shoulder.  Patient had completed cardiac rehab this morning, was driving home and began to feel unwell.  Patient admitted for NSTEMI in the beginning of May of this year.  Had a heart cath with minimal disease.  Patient activated as a stroke at triage.   Past Medical History Past Medical History:  Diagnosis Date   Allergy    Asthma    Chest pain 11/13/2009   overnight hospitalization   Constipation    Dr. Kristie   Fibromyalgia    GERD (gastroesophageal reflux disease)    Hyperlipidemia    Hypertension    Migraine    Non-ischemic cardiomyopathy (HCC) 03/25/2024   Nonalcoholic fatty liver disease    Patient Active Problem List   Diagnosis Date Noted   Prediabetes 03/25/2024   Non-ischemic cardiomyopathy (HCC) 03/25/2024   Systolic heart failure (HCC) 03/25/2024   Hospital discharge follow-up 03/25/2024   NSTEMI (non-ST elevated myocardial infarction) (HCC) 03/14/2024   Mild intermittent asthma, uncomplicated 03/14/2024   Rib cage dysfunction 01/02/2024   Acute bronchitis 01/02/2024   Sicca complex (HCC) 02/18/2018   History of migraine 02/18/2018   History of pleurisy 02/18/2018   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 Authorizing Provider  acetaminophen  (TYLENOL ) 500 MG  tablet Take 1,500 mg by mouth every 6 (six) hours as needed for mild pain (pain score 1-3). Reported on 05/01/2016    [provider]  albuterol  (PROVENTIL ) (2.5 MG/3ML) 0.083% nebulizer solution Take 3 mLs (2.5 mg total) by nebulization every 6 (six) hours as needed for wheezing or shortness of breath. 11/16/21   Chandra Harlene LABOR, NP  albuterol  (VENTOLIN  HFA) 108 (90 Base) MCG/ACT inhaler USE 2 PUFFS EVERY 4 TO 6 HOURS AS NEEDED 05/24/20   Duanne Butler DASEN, MD  aspirin  81 MG EC tablet 1 tablet po qOD 07/05/12   Tysinger, Alm RAMAN, PA-C  estradiol (ESTRACE) 0.1 MG/GM vaginal cream Place 1 g vaginally 3 (three) times a week. Apply 1 application on Monday, Wednesdays, and Fridays per patient 12/27/21   [provider]  estradiol (VIVELLE-DOT) 0.1 MG/24HR patch Place 1 patch onto the skin 2 (two) times a week. 11/15/23   [provider]  hydrochlorothiazide  (HYDRODIURIL ) 25 MG tablet Take 1 tablet (25 mg total) by mouth daily. 03/21/24   Kate Lonni CROME, MD  losartan  (COZAAR ) 50 MG tablet Take 1 tablet (50 mg total) by mouth daily. 03/17/24   Wonda Sharper, MD  Misc Natural Products (TART CHERRY ADVANCED PO) Take 1 tablet by mouth daily.    [provider]  Multiple Vitamins-Minerals (MULTIVITAMIN WITH MINERALS) tablet Take 1 tablet by mouth daily.    [provider]  Omega-3 1000 MG CAPS Take 1 capsule by mouth daily.    [provider]  promethazine  (PHENERGAN ) 12.5 MG tablet Take 1 tablet (  12.5 mg total) by mouth every 8 (eight) hours as needed for nausea or vomiting. 09/30/21   Chandra Harlene LABOR, NP  rosuvastatin  (CRESTOR ) 20 MG tablet Take 1 tablet (20 mg total) by mouth daily. 03/26/24 03/26/25  Paseda, Folashade R, FNP  SUMAtriptan  (IMITREX ) 50 MG tablet Take 1 tablet (50 mg total) by mouth 2 (two) times daily as needed for migraine. May repeat in 2 hours if headache persists or recurs. 01/23/24   Reddick, Johnathan B, NP  TURMERIC PO Take 1  tablet by mouth daily at 6 (six) AM.    [provider]  Vitamin D , Ergocalciferol , (DRISDOL ) 1.25 MG (50000 UNIT) CAPS capsule Take 1 capsule (50,000 Units total) by mouth every 7 (seven) days. Patient not taking: Reported on 05/12/2024 02/27/24   Cheryl Waddell HERO, PA-C                                                                                                                                    Past Surgical History Past Surgical History:  Procedure Laterality Date   ABDOMINAL HYSTERECTOMY     total   COLONOSCOPY  09/15/2009   Dr. Kristie; normal   LEFT HEART CATH AND CORONARY ANGIOGRAPHY N/A 03/14/2024   Procedure: LEFT HEART CATH AND CORONARY ANGIOGRAPHY;  Surgeon: Wonda Sharper, MD;  Location: [facility] INVASIVE CV LAB;  Service: Cardiovascular;  Laterality: N/A;   WISDOM TOOTH EXTRACTION     Family History Family History  Problem Relation Age of Onset   Cancer Mother        stage III, colon   Hypertension Mother    Dementia Mother    Heart disease Mother        bradycardia   Stroke Mother    Cancer Father        prostate   Hypertension Father    Hyperlipidemia Father    Heart disease Father 71       MI    Hyperlipidemia Sister    Hypertension Sister    Fibromyalgia Sister    Arthritis Brother        RA   Hypertension Brother    Hyperlipidemia Brother    Fibromyalgia Brother    Hypertension Brother    Hyperlipidemia Brother    Healthy Daughter    Healthy Son    Lupus Niece     Social History Social History   Tobacco Use   Smoking status: Never    Passive exposure: Never   Smokeless tobacco: Never  Vaping Use   Vaping status: Never Used  Substance Use Topics   Alcohol use: No    Alcohol/week: 0.0 standard drinks of alcohol   Drug use: No   Allergies Aspirin , Codeine, Cymbalta  [duloxetine  hcl], and Simvastatin  Review of Systems Review of Systems  Physical Exam Vital Signs  I have reviewed the triage vital signs BP (!) 180/85   Pulse 92   Temp  97.7  F (36.5 C)   Resp 14   SpO2 100%   Physical Exam Vitals and nursing note reviewed.   Cardiovascular:     Rate and Rhythm: Normal rate.     Heart sounds: Normal heart sounds.     Comments: Good perfusion of bilateral upper extremities Pulmonary:     Effort: Pulmonary effort is normal.     Breath sounds: Normal breath sounds.  Abdominal:     Palpations: Abdomen is soft.   Musculoskeletal:        General: Normal range of motion.   Neurological:     General: No focal deficit present.     Mental Status: She is alert.     Cranial Nerves: No cranial nerve deficit.     ED Results and Treatments Labs (all labs ordered are listed, but only abnormal results are displayed) Labs Reviewed  BASIC METABOLIC PANEL WITH GFR - Abnormal; Notable for the following components:      Result Value   Glucose, Bld 106 (*)    All other components within normal limits  CBC - Abnormal; Notable for the following components:   WBC 3.7 (*)    All other components within normal limits  I-STAT CHEM 8, ED - Abnormal; Notable for the following components:   Potassium 5.8 (*)    Calcium , Ion 1.11 (*)    All other components within normal limits  ETHANOL  PROTIME-INR  APTT  RAPID URINE DRUG SCREEN, HOSP PERFORMED  TROPONIN I (HIGH SENSITIVITY)  TROPONIN I (HIGH SENSITIVITY)                                                                                                                          Radiology MR BRAIN WO CONTRAST Result Date: 05/13/2024 CLINICAL DATA:  Stroke follow-up. EXAM: MRI HEAD WITHOUT CONTRAST TECHNIQUE: Multiplanar, multiecho pulse sequences of the brain and surrounding structures were obtained without intravenous contrast. COMPARISON:  CT head and CTA head and neck earlier same day. FINDINGS: Brain: No acute infarct. No evidence of intracranial hemorrhage. Scattered T2/FLAIR hyperintensity in the periventricular and subcortical white matter. No edema, mass effect, or midline  shift. Posterior fossa is unremarkable. Normal appearance of midline structures. The basilar cisterns are patent. No extra-axial fluid collections. Ventricles: Normal size and configuration of the ventricles. Vascular: Skull base flow voids are visualized. Skull and upper cervical spine: No focal abnormality. Sinuses/Orbits: Orbits are symmetric. Paranasal sinuses are clear. Other: Mastoid air cells are clear. IMPRESSION: No acute intracranial abnormality. Scattered supratentorial white matter signal abnormality. Differential includes mild chronic microvascular ischemic changes, sequelae of chronic migraine headaches, or prior infection/inflammatory process. Electronically Signed   By: Donnice Mania M.D.   On: 05/13/2024 15:31   CT ANGIO HEAD NECK W WO CM (CODE STROKE) Result Date: 05/13/2024 CLINICAL DATA:  Acute neurological deficit. EXAM: CT ANGIOGRAPHY HEAD AND NECK WITH AND WITHOUT CONTRAST TECHNIQUE: Multidetector CT imaging of the head and neck was performed using the standard protocol during  bolus administration of intravenous contrast. Multiplanar CT image reconstructions and MIPs were obtained to evaluate the vascular anatomy. Carotid stenosis measurements (when applicable) are obtained utilizing NASCET criteria, using the distal internal carotid diameter as the denominator. RADIATION DOSE REDUCTION: This exam was performed according to the departmental dose-optimization program which includes automated exposure control, adjustment of the mA and/or kV according to patient size and/or use of iterative reconstruction technique. CONTRAST:  75mL OMNIPAQUE  IOHEXOL  350 MG/ML SOLN COMPARISON:  CT of the head dated May 13, 2024 and July 23, 2023. FINDINGS: CTA NECK FINDINGS Aortic arch: Standard branching. Imaged portion shows no evidence of aneurysm or dissection. No significant stenosis of the major arch vessel origins. Right carotid system: The common carotid and internal carotid arteries are normal in  caliber and unremarkable in appearance. Left carotid system: The common carotid and internal carotid arteries are normal in caliber and unremarkable in appearance. Vertebral arteries: The vertebral arteries are widely patent bilaterally. Skeleton: Negative. Other neck: None. Upper chest: The visualized lungs are clear. Review of the MIP images confirms the above findings CTA HEAD FINDINGS Anterior circulation: The cranial in cavernous segments of internal carotid arteries are widely patent. The anterior and middle cerebral arteries and their proximal branches are also widely patent and unremarkable. Posterior circulation: The vertebrobasilar system appears normal. The cerebellar and posterior cerebral arteries are normal in caliber. No evidence of aneurysm, flow-limiting stenosis or large vessel occlusion. Venous sinuses: Patent. Anatomic variants: None. Review of the MIP images confirms the above findings IMPRESSION: 1. Normal CT angiogram of the head and neck. These results were paged at the time of interpretation on 05/13/2024 at 1:49 pm to provider Dr. Rosemarie. Electronically Signed   By: Evalene Coho M.D.   On: 05/13/2024 13:53   DG Chest 2 View Result Date: 05/13/2024 CLINICAL DATA:  chest pain EXAM: CHEST - 2 VIEW COMPARISON:  PA and lateral radiographs the chest dated Mar 14, 2024. FINDINGS: The heart size and mediastinal contours are within normal limits. Both lungs are clear. The visualized skeletal structures are unremarkable. IMPRESSION: No active cardiopulmonary disease. Electronically Signed   By: Evalene Coho M.D.   On: 05/13/2024 13:41   CT HEAD CODE STROKE WO CONTRAST Result Date: 05/13/2024 CLINICAL DATA:  Code stroke.  Left-sided weakness and tingling. EXAM: CT HEAD WITHOUT CONTRAST TECHNIQUE: Contiguous axial images were obtained from the base of the skull through the vertex without intravenous contrast. RADIATION DOSE REDUCTION: This exam was performed according to the departmental  dose-optimization program which includes automated exposure control, adjustment of the mA and/or kV according to patient size and/or use of iterative reconstruction technique. COMPARISON:  CT of the head dated July 23, 2023. FINDINGS: Brain: Normal. No evidence of hemorrhage, mass, cortical infarct or hydrocephalus. Vascular: Negative. Skull: Intact and unremarkable. Sinuses/Orbits: Negative Other: None. IMPRESSION: 1. Normal. 2. ASPECTS is 10. 3. These results were communicated by pain age on 05/13/2024 at 1:38 pm to provider Dr. Rosemarie. Electronically Signed   By: Evalene Coho M.D.   On: 05/13/2024 13:40    Pertinent labs & imaging results that were available during my care of the patient were reviewed by me and considered in my medical decision making (see MDM for details).  Medications Ordered in ED Medications  iohexol  (OMNIPAQUE ) 350 MG/ML injection 75 mL (75 mLs Intravenous Contrast Given 05/13/24 1346)  Procedures .Critical Care  Performed by: Mannie Fairy DASEN, DO Authorized by: Mannie Fairy DASEN, DO   Critical care provider statement:    Critical care time (minutes):  33   Critical care was necessary to treat or prevent imminent or life-threatening deterioration of the following conditions:  CNS failure or compromise   Critical care was time spent personally by me on the following activities:  Development of treatment plan with patient or surrogate, discussions with consultants, evaluation of patient's response to treatment, examination of patient, ordering and review of laboratory studies, ordering and review of radiographic studies, ordering and performing treatments and interventions, pulse oximetry, re-evaluation of patient's condition and review of old charts   (including critical care time)  Medical Decision Making / ED Course   This patient  presents to the ED for concern of left-sided numbness and burning pain, this involves an extensive number of treatment options, and is a complaint that carries with it a high risk of complications and morbidity.  The differential diagnosis includes ACS, carotid dissection, CVA, atypical chest pain, musculoskeletal pain.  MDM: With the patient's lateralizing symptoms, she was activated as a stroke.  Fortunately, patient's stroke workup negative.  Patient had noncontrasted head CT, CTAs that were negative.  MRI showed no acute changes.  Patient's initial EKG nonischemic.  First troponin is 3.  Patient currently having no symptoms.  Discussed patient's results with her and her husband at bedside.  Patient will be signed out to Dr. Gennaro pending delta troponin.   Additional history obtained: -Additional history obtained from family at bedside -External records from outside source obtained and reviewed including: Chart review including previous notes, labs, imaging, consultation notes   Lab Tests: -I ordered, reviewed, and interpreted labs.   The pertinent results include:   Labs Reviewed  BASIC METABOLIC PANEL WITH GFR - Abnormal; Notable for the following components:      Result Value   Glucose, Bld 106 (*)    All other components within normal limits  CBC - Abnormal; Notable for the following components:   WBC 3.7 (*)    All other components within normal limits  I-STAT CHEM 8, ED - Abnormal; Notable for the following components:   Potassium 5.8 (*)    Calcium , Ion 1.11 (*)    All other components within normal limits  ETHANOL  PROTIME-INR  APTT  RAPID URINE DRUG SCREEN, HOSP PERFORMED  TROPONIN I (HIGH SENSITIVITY)  TROPONIN I (HIGH SENSITIVITY)      EKG   EKG Interpretation Date/Time:  Tuesday May 13 2024 12:28:25 EDT Ventricular Rate:  88 PR Interval:  160 QRS Duration:  78 QT Interval:  368 QTC Calculation: 445 R Axis:   12  Text Interpretation: Normal sinus  rhythm Possible Left atrial enlargement Nonspecific T wave abnormality Abnormal ECG When compared with ECG of 15-Mar-2024 05:52, PREVIOUS ECG IS PRESENT Confirmed by Mannie Fairy [phone]) on 05/13/2024 2:29:36 PM         Imaging Studies ordered: I ordered imaging studies including MRI brain, CTA neck, CTA head, chest x-ray I independently visualized and interpreted imaging. I agree with the radiologist interpretation   Medicines ordered and prescription drug management: Meds ordered this encounter  Medications   iohexol  (OMNIPAQUE ) 350 MG/ML injection 75 mL    -I have reviewed the patients home medicines and have made adjustments as needed  Critical interventions Code stroke activation  Consultations Obtained: I requested consultation with the neurology team,  and discussed lab and imaging findings  as well as pertinent plan - they recommend: No additional intervention   Cardiac Monitoring: The patient was maintained on a cardiac monitor.  I personally viewed and interpreted the cardiac monitored which showed an underlying rhythm of: Normal sinus rhythm  Social Determinants of Health:  Factors impacting patients care include: Lack of access to primary care.   Reevaluation: After the interventions noted above, I reevaluated the patient and found that they have :improved  Co morbidities that complicate the patient evaluation  Past Medical History:  Diagnosis Date   Allergy    Asthma    Chest pain 11/13/2009   overnight hospitalization   Constipation    Dr. Kristie   Fibromyalgia    GERD (gastroesophageal reflux disease)    Hyperlipidemia    Hypertension    Migraine    Non-ischemic cardiomyopathy (HCC) 03/25/2024   Nonalcoholic fatty liver disease          Final Clinical Impression(s) / ED Diagnoses Final diagnoses:  None     @PCDICTATION @    Mannie Pac T, DO 05/13/24 1551

## 2024-05-13 NOTE — ED Provider Triage Note (Signed)
 Emergency Medicine Provider Triage Evaluation Note  Sherry Daniels , a 63 y.o. female  was evaluated in triage.  Pt complains of nausea, chest discomfort, left sided facial, arm and leg numbness and tingling.  History of N-STEMI earlier this May.  Review of Systems  Positive:  Negative:   Physical Exam  BP (!) 180/86   Pulse 92   Temp 97.7 F (36.5 C)   Resp 14   SpO2 100%  Gen:   Awake, no distress   Resp:  Normal effort  MSK:   Moves extremities without difficulty  Other:  Sensation deficits appreciated on the left side, no weakness  Medical Decision Making  Medically screening exam initiated at 1:20 PM.  Appropriate orders placed.  Sherry Daniels was informed that the remainder of the evaluation will be completed by another provider, this initial triage assessment does not replace that evaluation, and the importance of remaining in the ED until their evaluation is complete.  Code stroke called given left-sided sensation deficits, will further evaluate for cardiac etiology.   Sherry Lynwood DEL, PA-C 05/13/24 1323

## 2024-05-13 NOTE — Consult Note (Addendum)
 NEUROLOGY CONSULT NOTE   Date of service: May 13, 2024 Patient Name: Sherry Daniels MRN:  [id number] DOB:  [birth year] Chief Complaint: Left-sided numbness and tingling Requesting Provider: Mannie Fairy DASEN, DO  History of Present Illness  Sherry Daniels is a 63 y.o. female with hx of asthma, possible lupus, hypertension, hyperlipidemia, fibromyalgia, GERD, IBS, nonischemic cardiomyopathy and recent non-STEMI who presents with acute onset left-sided numbness and tingling.  Patient states that she went for a walk for about 30 minutes today as part of her cardiac rehabilitation, became diaphoretic, began feeling some tingling in the upper part of her chest as well as numbness on the left side of her face and numbness and tingling in her left arm and left leg.  Symptoms were gradual in onset.  She does verbalize some anxiety.  She reports that she has had recent chest pressure associated with her MI, did have some shortness of breath earlier today, has had intermittent nausea and dizziness and has intermittent problems with constipation  LKW: 1114 Modified rankin score: 1-No significant post stroke disability and can perform usual duties with stroke symptoms IV Thrombolysis: No, symptoms too mild to treat EVT: No, no LVO  NIHSS components Score: Comment  1a Level of Conscious 0[x]  1[]  2[]  3[]      1b LOC Questions 0[x]  1[]  2[]       1c LOC Commands 0[x]  1[]  2[]       2 Best Gaze 0[x]  1[]  2[]       3 Visual 0[x]  1[]  2[]  3[]      4 Facial Palsy 0[x]  1[]  2[]  3[]      5a Motor Arm - left 0[x]  1[]  2[]  3[]  4[]  UN[]    5b Motor Arm - Right 0[x]  1[]  2[]  3[]  4[]  UN[]    6a Motor Leg - Left 0[x]  1[]  2[]  3[]  4[]  UN[]    6b Motor Leg - Right 0[x]  1[]  2[]  3[]  4[]  UN[]    7 Limb Ataxia 0[x]  1[]  2[]  UN[]      8 Sensory 0[]  1[x]  2[]  UN[]      9 Best Language 0[x]  1[]  2[]  3[]      10 Dysarthria 0[x]  1[]  2[]  UN[]      11 Extinct. and Inattention 0[x]  1[]  2[]       TOTAL:1     NIHSS 1.  Premorbid  modified Rankin score 0   ROS  Comprehensive ROS performed and pertinent positives documented in HPI   Past History   Past Medical History:  Diagnosis Date   Allergy    Asthma    Chest pain 11/13/2009   overnight hospitalization   Constipation    Dr. Kristie   Fibromyalgia    GERD (gastroesophageal reflux disease)    Hyperlipidemia    Hypertension    Migraine    Non-ischemic cardiomyopathy (HCC) 03/25/2024   Nonalcoholic fatty liver disease     Past Surgical History:  Procedure Laterality Date   ABDOMINAL HYSTERECTOMY     total   COLONOSCOPY  09/15/2009   Dr. Kristie; normal   LEFT HEART CATH AND CORONARY ANGIOGRAPHY N/A 03/14/2024   Procedure: LEFT HEART CATH AND CORONARY ANGIOGRAPHY;  Surgeon: Wonda Sharper, MD;  Location: [facility] INVASIVE CV LAB;  Service: Cardiovascular;  Laterality: N/A;   WISDOM TOOTH EXTRACTION      Family History: Family History  Problem Relation Age of Onset   Cancer Mother        stage III, colon   Hypertension Mother    Dementia Mother    Heart disease Mother  bradycardia   Stroke Mother    Cancer Father        prostate   Hypertension Father    Hyperlipidemia Father    Heart disease Father 73       MI    Hyperlipidemia Sister    Hypertension Sister    Fibromyalgia Sister    Arthritis Brother        RA   Hypertension Brother    Hyperlipidemia Brother    Fibromyalgia Brother    Hypertension Brother    Hyperlipidemia Brother    Healthy Daughter    Healthy Son    Lupus Niece     Social History  reports that she has never smoked. She has never been exposed to tobacco smoke. She has never used smokeless tobacco. She reports that she does not drink alcohol and does not use drugs.  Allergies  Allergen Reactions   Aspirin  Nausea Only   Codeine Nausea Only   Cymbalta  [Duloxetine  Hcl]     Felt like it worsened anxiety and fibromyalgia   Simvastatin Other (See Comments)    dizziness    Medications  No current  facility-administered medications for this encounter.  Current Outpatient Medications:    acetaminophen  (TYLENOL ) 500 MG tablet, Take 1,500 mg by mouth every 6 (six) hours as needed for mild pain (pain score 1-3). Reported on 05/01/2016, Disp: , Rfl:    albuterol  (PROVENTIL ) (2.5 MG/3ML) 0.083% nebulizer solution, Take 3 mLs (2.5 mg total) by nebulization every 6 (six) hours as needed for wheezing or shortness of breath., Disp: 150 mL, Rfl: 1   albuterol  (VENTOLIN  HFA) 108 (90 Base) MCG/ACT inhaler, USE 2 PUFFS EVERY 4 TO 6 HOURS AS NEEDED, Disp: 18 g, Rfl: 2   aspirin  81 MG EC tablet, 1 tablet po qOD, Disp: 30 tablet, Rfl: 12   estradiol (ESTRACE) 0.1 MG/GM vaginal cream, Place 1 g vaginally 3 (three) times a week. Apply 1 application on Monday, Wednesdays, and Fridays per patient, Disp: , Rfl:    estradiol (VIVELLE-DOT) 0.1 MG/24HR patch, Place 1 patch onto the skin 2 (two) times a week., Disp: , Rfl:    hydrochlorothiazide  (HYDRODIURIL ) 25 MG tablet, Take 1 tablet (25 mg total) by mouth daily., Disp: , Rfl:    losartan  (COZAAR ) 50 MG tablet, Take 1 tablet (50 mg total) by mouth daily., Disp: 90 tablet, Rfl: 3   Misc Natural Products (TART CHERRY ADVANCED PO), Take 1 tablet by mouth daily., Disp: , Rfl:    Multiple Vitamins-Minerals (MULTIVITAMIN WITH MINERALS) tablet, Take 1 tablet by mouth daily., Disp: , Rfl:    Omega-3 1000 MG CAPS, Take 1 capsule by mouth daily., Disp: , Rfl:    promethazine  (PHENERGAN ) 12.5 MG tablet, Take 1 tablet (12.5 mg total) by mouth every 8 (eight) hours as needed for nausea or vomiting., Disp: 20 tablet, Rfl: 0   rosuvastatin  (CRESTOR ) 20 MG tablet, Take 1 tablet (20 mg total) by mouth daily., Disp: 30 tablet, Rfl: 1   SUMAtriptan  (IMITREX ) 50 MG tablet, Take 1 tablet (50 mg total) by mouth 2 (two) times daily as needed for migraine. May repeat in 2 hours if headache persists or recurs., Disp: 20 tablet, Rfl: 0   TURMERIC PO, Take 1 tablet by mouth daily at 6 (six)  AM., Disp: , Rfl:    Vitamin D , Ergocalciferol , (DRISDOL ) 1.25 MG (50000 UNIT) CAPS capsule, Take 1 capsule (50,000 Units total) by mouth every 7 (seven) days. (Patient not taking: Reported on 05/12/2024), Disp: 12  capsule, Rfl: 0  Vitals   Vitals:   05/27/24 1220 May 27, 2024 1345  BP: (!) 180/86 (!) 180/85  Pulse: 92   Resp: 14   Temp: 97.7 F (36.5 C)   SpO2: 100%     There is no height or weight on file to calculate BMI.   Physical Exam   Constitutional: Appears well-developed and well-nourished.  Psych: Affect appropriate to situation.  Eyes: No scleral injection.  HENT: No OP obstruction.  Head: Normocephalic.  Cardiovascular: Normal rate and regular rhythm.  Respiratory: Effort normal, non-labored breathing.  GI: Soft.  No distension. There is no tenderness.  Skin: WDI.   Neurologic Examination    NEURO:  Mental Status: AA&Ox3, able to give clear and coherent history of present illness Speech/Language: speech is without dysarthria or aphasia.  Fluency, and comprehension intact.  Cranial Nerves:  II: PERRL.  III, IV, VI: EOMI. Eyelids elevate symmetrically.  V: Diminished left hemibody sensation  to light touch including the whole left face VII: Smile is symmetrical. Able to puff cheeks and raise eyebrows.  VIII: hearing intact to voice. IX, X: Phonation is normal.  KP:Dynloizm shrug 5/5. XII: tongue is midline without fasciculations. Motor: 5/5 strength to all muscle groups tested.  Tone: is normal and bulk is normal Sensation- Intact to light touch bilaterally. Extinction absent to light touch to DSS.  Coordination: FTN intact bilaterally Gait- deferred   Labs/Imaging/Neurodiagnostic studies   CBC:  Recent Labs  Lab May 27, 2024 1254 05/27/2024 1328  WBC 3.7*  --   HGB 12.4 13.6  HCT 38.4 40.0  MCV 91.2  --   PLT 212  --    Basic Metabolic Panel:  Lab Results  Component Value Date   NA 137 27-May-2024   K 5.8 (H) 2024/05/27   CO2 22 03/31/2024    GLUCOSE 98 05-27-2024   BUN 13 May 27, 2024   CREATININE 0.70 2024/05/27   CALCIUM  9.5 03/31/2024   GFRNONAA >60 03/16/2024   GFRAA 119 05/03/2021   Lipid Panel:  Lab Results  Component Value Date   LDLCALC 139 (H) 03/25/2024   HgbA1c:  Lab Results  Component Value Date   HGBA1C 5.7 (H) 03/15/2024   Urine Drug Screen:     Component Value Date/Time   LABOPIA POSITIVE (A) 12/13/2009 1256   COCAINSCRNUR NONE DETECTED 12/13/2009 1256   LABBENZ NONE DETECTED 12/13/2009 1256   AMPHETMU NONE DETECTED 12/13/2009 1256   THCU NONE DETECTED 12/13/2009 1256   LABBARB  12/13/2009 1256    NONE DETECTED        DRUG SCREEN FOR MEDICAL PURPOSES ONLY.  IF CONFIRMATION IS NEEDED FOR ANY PURPOSE, NOTIFY LAB WITHIN 5 DAYS.        LOWEST DETECTABLE LIMITS FOR URINE DRUG SCREEN Drug Class       Cutoff (ng/mL) Amphetamine      1000 Barbiturate      200 Benzodiazepine   200 Tricyclics       300 Opiates          300 Cocaine          300 THC              50    Alcohol Level No results found for: [facility] INR  Lab Results  Component Value Date   INR 0.99 11/24/2011   APTT  Lab Results  Component Value Date   APTT 31 11/24/2011   AED levels: No results found for: PHENYTOIN, ZONISAMIDE, LAMOTRIGINE, LEVETIRACETA  CT Head without contrast(Personally  reviewed): No acute abnormality  CT angio Head and Neck with contrast(Personally reviewed): No LVO or hemodynamically significant stenosis  MRI Brain(Personally reviewed): Pending  ASSESSMENT   Saretta Dahlem is a 63 y.o. female  with hx of asthma, possible lupus, hypertension, hyperlipidemia, fibromyalgia, GERD, IBS, nonischemic cardiomyopathy and recent non-STEMI who presents with acute onset left-sided tingling and numbness along with diaphoresis, dyspnea, feeling of tingling in the chest and anxiety.  Likely strokelike episode possible related to underlying anxiety.  CT head reveals no acute abnormality, and CT  angiogram reveals no LVO.  Symptoms are too mild to treat with TNK, so patient will be closely monitored until she was out of the window.  Will obtain brain MRI to determine if symptoms are due to stroke and admit for stroke workup if positive.  RECOMMENDATIONS  -MRI brain, proceed with full stroke workup if positive ______________________________________________________________________  Patient seen by NP with MD, MD to edit note as needed  Signed, Cortney E Everitt Clint Kill, NP [facility]   I have personally obtained history,examined this patient, reviewed notes, independently viewed imaging studies, participated in medical decision making and plan of care.ROS completed by me personally and pertinent positives fully documented  I have made any additions or clarifications directly to the above note. Agree with note above.  Patient presented with sudden onset of left face and upper extremity numbness and tingling without any other accompanying neurological symptoms.  She does have element of underlying anxiety and recently had cardiac workup which was negative for cardiac symptoms.  Her strokelike presentation may be compatible with right thalamic infarct though underlying stress and anxiety could be contributing as well.  Patient deficits are too mild to treat with thrombolysis.  Recommend check CT angiogram of the brain and MRI scan of the brain.  If negative for stroke will need no further workup.  If MRI is positive for stroke will order remaining stroke workup.  Discussed with patient and answered questions.  Discussed with ER MD.  Stroke team to follow. Greater than 50% time during this 60-minute consultation visit was spent on counseling and coordination of care about strokelike presentation and discussion about evaluation and treatment and answering questions. Eather Popp, MD Medical Director [facility] Pager: [phone] 05/13/2024 4:18 PM

## 2024-05-13 NOTE — ED Triage Notes (Signed)
 Reports L sided facial numbness, mild nausea, dizziness, L shoulder burning, and L sided chest pain that started at 11:15am.  Also having upper back pain. No arm/leg numbness/weakness.  No arm drift.

## 2024-05-13 NOTE — ED Notes (Signed)
 Patient transported to X-ray

## 2024-05-13 NOTE — Progress Notes (Signed)
 Office Visit    Patient Name: Sherry Daniels Date of Encounter: 05/14/2024  Primary Care Provider:  Paseda, Folashade R, FNP Primary Cardiologist:  Darryle ONEIDA Decent, MD  Chief Complaint    63 year old female with a history of nonobstructive CAD, NICM (Takotsubo CM), mitral valve regurgitation, bradycardia, hypertension, hyperlipidemia, fibromyalgia, and GERD who presents for follow-up related to CAD and Takotsubo cardiomyoptahy.   Past Medical History    Past Medical History:  Diagnosis Date   Allergy    Asthma    Chest pain 11/13/2009   overnight hospitalization   Constipation    Dr. Kristie   Fibromyalgia    GERD (gastroesophageal reflux disease)    Hyperlipidemia    Hypertension    Migraine    Non-ischemic cardiomyopathy (HCC) 03/25/2024   Nonalcoholic fatty liver disease    Past Surgical History:  Procedure Laterality Date   ABDOMINAL HYSTERECTOMY     total   COLONOSCOPY  09/15/2009   Dr. Kristie; normal   LEFT HEART CATH AND CORONARY ANGIOGRAPHY N/A 03/14/2024   Procedure: LEFT HEART CATH AND CORONARY ANGIOGRAPHY;  Surgeon: Wonda Sharper, MD;  Location: [facility] INVASIVE CV LAB;  Service: Cardiovascular;  Laterality: N/A;   WISDOM TOOTH EXTRACTION      Allergies  Allergies  Allergen Reactions   Aspirin  Nausea Only   Codeine Nausea Only   Cymbalta  [Duloxetine  Hcl]     Felt like it worsened anxiety and fibromyalgia   Simvastatin Other (See Comments)    dizziness     Labs/Other Studies Reviewed    The following studies were reviewed today:  Cardiac Studies & Procedures   ______________________________________________________________________________________________ CARDIAC CATHETERIZATION  CARDIAC CATHETERIZATION 03/14/2024  Conclusion 1.  Minimal nonobstructive coronary artery disease with no significant stenoses 2.  Severe segmental LV dysfunction with periapical ballooning, hyperdynamic basal segments, LVEF estimated at 35%, findings consistent with  acute Takotsubo syndrome  Recommendations: Will adjust her antihypertensive medications with discontinuation of amlodipine  and we will start her on metoprolol  succinate (beta-1 selective agent in the setting of asthma), and losartan .  Check 2D echo.  Counseled patient and family on findings, prognosis, and rationale for medical therapy.  Anticipate 24 to 48-hour hospitalization depending on her symptoms.  Findings Coronary Findings Diagnostic  Dominance: Right  Left Main Vessel is angiographically normal.  Left Anterior Descending The vessel exhibits minimal luminal irregularities. There is mild plaquing in the mid vessel with no significant stenosis.  The remaining portions of the LAD and its diagonal branches are widely patent.  Left Circumflex Vessel is angiographically normal.  Right Coronary Artery Vessel is large. Vessel is angiographically normal. Large, dominant vessel.  Angiographically normal.  The PDA and PLA branches are widely patent.  Intervention  No interventions have been documented.     ECHOCARDIOGRAM  ECHOCARDIOGRAM COMPLETE 03/15/2024  Narrative ECHOCARDIOGRAM REPORT    Patient Name:   Sherry Daniels Date of Exam: 03/15/2024 Medical Rec #:  [id number]               Height:       66.0 in Accession #:    [id number]              Weight:       169.0 lb Date of Birth:  Dec 15, 1960               BSA:          1.862 m Patient Age:    62 years  BP:           111/78 mmHg Patient Gender: F                       HR:           62 bpm. Exam Location:  Inpatient  Procedure: 2D Echo (Both Spectral and Color Flow Doppler were utilized during procedure).  Indications:    Chest pain  History:        Patient has no prior history of Echocardiogram examinations. Risk Factors:Hypertension and Dyslipidemia.  Sonographer:    Tinnie Barefoot RDCS Referring Phys: [phone] MICHAEL COOPER   Sonographer Comments: Global longitudinal strain was  attempted. IMPRESSIONS   1. Mid/apical inferior wall septal and apical akinesis . Left ventricular ejection fraction, by estimation, is 40 to 45%. The left ventricle has mildly decreased function. The left ventricle demonstrates regional wall motion abnormalities (see scoring diagram/findings for description). The left ventricular internal cavity size was mildly dilated. Left ventricular diastolic parameters were normal. The average left ventricular global longitudinal strain is -19.5 %. The global longitudinal strain is normal. 2. Right ventricular systolic function is normal. The right ventricular size is normal. There is normal pulmonary artery systolic pressure. 3. The mitral valve is abnormal. Mild mitral valve regurgitation. No evidence of mitral stenosis. 4. The aortic valve is tricuspid. Aortic valve regurgitation is not visualized. No aortic stenosis is present. 5. The inferior vena cava is dilated in size with >50% respiratory variability, suggesting right atrial pressure of 8 mmHg.  FINDINGS Left Ventricle: Mid/apical inferior wall septal and apical akinesis. Left ventricular ejection fraction, by estimation, is 40 to 45%. The left ventricle has mildly decreased function. The left ventricle demonstrates regional wall motion abnormalities. The average left ventricular global longitudinal strain is -19.5 %. Strain was performed and the global longitudinal strain is normal. The left ventricular internal cavity size was mildly dilated. There is no left ventricular hypertrophy. Left ventricular diastolic parameters were normal.  Right Ventricle: The right ventricular size is normal. No increase in right ventricular wall thickness. Right ventricular systolic function is normal. There is normal pulmonary artery systolic pressure. The tricuspid regurgitant velocity is 2.20 m/s, and with an assumed right atrial pressure of 5 mmHg, the estimated right ventricular systolic pressure is 24.4  mmHg.  Left Atrium: Left atrial size was normal in size.  Right Atrium: Right atrial size was normal in size.  Pericardium: There is no evidence of pericardial effusion.  Mitral Valve: The mitral valve is abnormal. There is mild thickening of the mitral valve leaflet(s). Mild mitral valve regurgitation. No evidence of mitral valve stenosis.  Tricuspid Valve: The tricuspid valve is normal in structure. Tricuspid valve regurgitation is mild . No evidence of tricuspid stenosis.  Aortic Valve: The aortic valve is tricuspid. Aortic valve regurgitation is not visualized. No aortic stenosis is present.  Pulmonic Valve: The pulmonic valve was normal in structure. Pulmonic valve regurgitation is trivial. No evidence of pulmonic stenosis.  Aorta: The aortic root is normal in size and structure.  Venous: The inferior vena cava is dilated in size with greater than 50% respiratory variability, suggesting right atrial pressure of 8 mmHg.  IAS/Shunts: No atrial level shunt detected by color flow Doppler.  Additional Comments: 3D was performed not requiring image post processing on an independent workstation and was indeterminate.   LEFT VENTRICLE PLAX 2D LVIDd:         4.60 cm     Diastology LVIDs:  2.50 cm     LV e' medial:    7.62 cm/s LV PW:         0.90 cm     LV E/e' medial:  9.3 LV IVS:        1.00 cm     LV e' lateral:   11.60 cm/s LVOT diam:     1.60 cm     LV E/e' lateral: 6.1 LV SV:         49 LV SV Index:   26          2D Longitudinal Strain LVOT Area:     2.01 cm    2D Strain GLS Avg:     -19.5 %  LV Volumes (MOD) LV vol d, MOD A4C: 81.8 ml LV vol s, MOD A4C: 39.3 ml LV SV MOD A4C:     81.8 ml  RIGHT VENTRICLE             IVC RV Basal diam:  2.90 cm     IVC diam: 2.10 cm RV S prime:     12.60 cm/s TAPSE (M-mode): 2.0 cm  LEFT ATRIUM             Index        RIGHT ATRIUM           Index LA diam:        3.50 cm 1.88 cm/m   RA Area:     12.80 cm LA Vol (A2C):    45.6 ml 24.49 ml/m  RA Volume:   32.50 ml  17.46 ml/m LA Vol (A4C):   43.7 ml 23.47 ml/m LA Biplane Vol: 47.0 ml 25.25 ml/m AORTIC VALVE LVOT Vmax:   104.00 cm/s LVOT Vmean:  69.700 cm/s LVOT VTI:    0.245 m  AORTA Ao Root diam: 2.50 cm Ao Asc diam:  2.90 cm  MITRAL VALVE               TRICUSPID VALVE MV Area (PHT): 3.91 cm    TR Peak grad:   19.4 mmHg MV Decel Time: 194 msec    TR Vmax:        220.00 cm/s MR Peak grad: 72.9 mmHg MR Mean grad: 52.0 mmHg    SHUNTS MR Vmax:      427.00 cm/s  Systemic VTI:  0.24 m MR Vmean:     344.0 cm/s   Systemic Diam: 1.60 cm MV E velocity: 71.00 cm/s MV A velocity: 65.60 cm/s MV E/A ratio:  1.08  Maude Emmer MD Electronically signed by Maude Emmer MD Signature Date/Time: 03/15/2024/5:09:51 PM    Final    MONITORS  LONG TERM MONITOR XT (3-14 DAYS) 09/27/2021  Narrative Enrollment 09/09/2021-09/14/2021 (5 days 11 hours). Patient had a min HR of 52 bpm (sinus bradycardia), max HR of 152 bpm (sinus tachycardia), and avg HR of 74 bpm (normal sinus rhythm). Predominant underlying rhythm was Sinus Rhythm. Isolated SVEs were rare (<1.0%), and no SVE Couplets or SVE Triplets were present. Isolated VEs were rare (<1.0%), VE Couplets were rare (<1.0%), and no VE Triplets were present.  09/09/21 08:40pm Light headed,Light sweating coincided with normal sinus rhythm 68 bpm. 09/10/21 02:25am Chest pain or pressure,Dizziness,Light headed coincided with normal sinus rhythm 68 bpm. 09/10/21 05:05am Fluttering or racing,Light headed,Sweating coincided with normal sinus rhythm 66 bpm. 09/10/21 11:30am Anxious,Chest pain or pressure,Light headed,Mild chest pressure coincided with normal sinus rhythm 72 bpm. 09/11/21 09:07pm Short of breath coincided with normal sinus rhythm 76  bpm.  Impression: 1. No arrhythmias detected. 2. Rare ectopy.  Darryle T. Barbaraann, MD, [facility]  [facility] [address], [address] [place], [place]  [address] [phone] 7:25 PM   CT SCANS  CT CORONARY MORPH W/CTA COR W/SCORE 09/16/2021  Addendum 09/16/2021 11:10 AM ADDENDUM REPORT: 09/16/2021 11:07  CLINICAL DATA:  Chest pain  EXAM: Cardiac CTA  MEDICATIONS: Sub lingual nitro. 4 mg and lopressor  100mg   TECHNIQUE: The patient was scanned on a [employer] 192 scanner. Gantry rotation speed was 250 msecs. Collimation was. 6 mm . A 120 kV prospective scan was triggered in the ascending thoracic aorta at 140 HU's with full mA between 30-70% of the R-R interval . Average HR during the scan was 47 bpm. The 3D data set was interpreted on a dedicated work station using MPR, MIP and VRT modes. A total of 80 cc of contrast was used.  FINDINGS: Non-cardiac: See separate report from [facility]. No significant findings on limited lung and soft tissue windows.  Calcium  score: No calcium  noted  Coronary Arteries: Right dominant with no anomalies  LM: Normal  LAD: Normal  D1: Normal  D2: Normal  Circumflex: Normal  OM1: Normal  OM2: Normal  RCA: Normal  PDA: Normal  PLA: Normal  IMPRESSION: 1.  Calcium  score 0  2.  Normal ascending thoracic aorta 3.1 cm  3.  Normal right dominant coronary arteries  Maude Emmer   Electronically Signed By: Maude Emmer M.D. On: 09/16/2021 11:07  Narrative EXAM: OVER-READ INTERPRETATION  CT CHEST  The following report is an over-read performed by radiologist Dr. Toribio Aye of [facility] on 09/16/2021. This over-read does not include interpretation of cardiac or coronary anatomy or pathology. The coronary calcium  score/coronary CTA interpretation by the cardiologist is attached.  COMPARISON:  None.  FINDINGS: Within the visualized portions of the thorax there are no suspicious appearing pulmonary nodules or masses, there is no acute consolidative airspace disease, no pleural effusions, no pneumothorax and no lymphadenopathy.  Visualized portions of the upper abdomen are unremarkable. There are no aggressive appearing lytic or blastic lesions noted in the visualized portions of the skeleton.  IMPRESSION: 1. No significant incidental noncardiac findings are noted.  Electronically Signed: By: Toribio Aye M.D. On: 09/16/2021 10:11     ______________________________________________________________________________________________     Recent Labs: 02/25/2024: TSH 0.99 03/15/2024: ALT 38; Magnesium  2.0 05/13/2024: BUN 13; Creatinine, Ser 0.70; Hemoglobin 13.6; Platelets 212; Platelets 215; Potassium 5.8; Sodium 137  Recent Lipid Panel    Component Value Date/Time   CHOL 234 (H) 03/25/2024 1032   TRIG 102 03/25/2024 1032   HDL 77 03/25/2024 1032   CHOLHDL 3.0 03/25/2024 1032   CHOLHDL 2.9 03/15/2024 0333   VLDL 13 03/15/2024 0333   LDLCALC 139 (H) 03/25/2024 1032   LDLCALC 175 (H) 05/24/2020 0941    History of Present Illness    63 year old female with the above past medical history including nonobstructive CAD, NICM (Takotsubo CM), mitral valve regurgitation, bradycardia, hypertension, hyperlipidemia, fibromyalgia, and GERD.  She was initially referred to cardiology in 2022 in the setting of chest tightness and palpitations. Coronary CT angiogram in 09/2021 revealed coronary calcium  score of 0. 7-day ZIO monitor in 09/2021 revealed predominantly normal sinus rhythm, sinus bradycardia, sinus tachycardia, rare PACs and PVCs. She was hospitalized in May 2025 in the setting of NSTEMI.  Cardiac catheterization revealed minimal nonobstructive CAD, severe segmental LV dysfunction with periapical ballooning, hyperdynamic basal segments, LVEF estimated 35%,  findings consistent with acute Takotsubo syndrome.  She was started on metoprolol .  Echocardiogram in 03/2024 showed EF 40 to 45%, mildly decreased LV function, RWMA, normal RV systolic function, mild mitral valve regurgitation.  She was last seen in the office on  04/01/2024 and was stable from a cardiac standpoint.  Metoprolol  was reduced to 12.5 mg twice daily in the setting of symptomatic bradycardia, and ultimately discontinued.  Repeat echocardiogram was recommended in 3 months. She was seen in the ED on 05/13/2024 in the setting of chest pain, diaphoresis, shortness of breath. She  also reported left arm numbness, L facial numbness.  MRI, CT head were negative for acute findings. Troponin was negative x 2. She was discharged home.  She presents today for follow-up.  Since her last visit and recent ED visit she has been stable from a cardiac standpoint. She does report some mild dyspnea on exertion, intermittent chest discomfort, she gets winded having long conversations and feels generally fatigued.  She also notes nocturnal leg cramps.  She did note some improvement following discontinuation of metoprolol , however, her symptoms have since worsened.  She questions whether or not her symptoms that prompted her ED visit yesterday could have been the result of a panic attack.    Home Medications    Current Outpatient Medications  Medication Sig Dispense Refill   acetaminophen  (TYLENOL ) 500 MG tablet Take 1,500 mg by mouth every 6 (six) hours as needed for mild pain (pain score 1-3). Reported on 05/01/2016     albuterol  (PROVENTIL ) (2.5 MG/3ML) 0.083% nebulizer solution Take 3 mLs (2.5 mg total) by nebulization every 6 (six) hours as needed for wheezing or shortness of breath. 150 mL 1   albuterol  (VENTOLIN  HFA) 108 (90 Base) MCG/ACT inhaler USE 2 PUFFS EVERY 4 TO 6 HOURS AS NEEDED (Patient taking differently: Inhale 1 puff into the lungs every 6 (six) hours as needed for shortness of breath.) 18 g 2   aspirin  81 MG EC tablet 1 tablet po qOD 30 tablet 12   estradiol (ESTRACE) 0.1 MG/GM vaginal cream Place 1 g vaginally 3 (three) times a week. Apply 1 application on Monday, Wednesdays, and Fridays per patient     estradiol (VIVELLE-DOT) 0.1 MG/24HR patch Place 1  patch onto the skin 2 (two) times a week.     hydrochlorothiazide  (HYDRODIURIL ) 25 MG tablet Take 1 tablet (25 mg total) by mouth daily.     losartan  (COZAAR ) 50 MG tablet Take 1 tablet (50 mg total) by mouth daily. (Patient taking differently: Take 50 mg by mouth every evening.) 90 tablet 3   Misc Natural Products (TART CHERRY ADVANCED PO) Take 1 tablet by mouth daily.     Multiple Vitamins-Minerals (MULTIVITAMIN WITH MINERALS) tablet Take 1 tablet by mouth daily.     Omega-3 1000 MG CAPS Take 1 capsule by mouth daily.     promethazine  (PHENERGAN ) 12.5 MG tablet Take 1 tablet (12.5 mg total) by mouth every 8 (eight) hours as needed for nausea or vomiting. 20 tablet 0   rosuvastatin  (CRESTOR ) 20 MG tablet Take 1 tablet (20 mg total) by mouth daily. 30 tablet 1   SUMAtriptan  (IMITREX ) 50 MG tablet Take 1 tablet (50 mg total) by mouth 2 (two) times daily as needed for migraine. May repeat in 2 hours if headache persists or recurs. 20 tablet 0   TURMERIC PO Take 1 tablet by mouth daily at 6 (six) AM.     Vitamin D , Ergocalciferol , (DRISDOL ) 1.25 MG (50000 UNIT)  CAPS capsule Take 1 capsule (50,000 Units total) by mouth every 7 (seven) days. 12 capsule 0   No current facility-administered medications for this visit.     Review of Systems    She denies palpitations, pnd, orthopnea, n, v, dizziness, syncope, edema, weight gain, or early satiety. All other systems reviewed and are otherwise negative except as noted above.   Physical Exam    VS:  BP 116/74   Pulse 77   Ht 5' 6 (1.676 m)   Wt 167 lb 9.6 oz (76 kg)   SpO2 99%   BMI 27.05 kg/m   GEN: Well nourished, well developed, in no acute distress. HEENT: normal. Neck: Supple, no JVD, carotid bruits, or masses. Cardiac: RRR, no murmurs, rubs, or gallops. No clubbing, cyanosis, edema.  Radials/DP/PT 2+ and equal bilaterally.  Respiratory:  Respirations regular and unlabored, clear to auscultation bilaterally. GI: Soft, nontender,  nondistended, BS + x 4. MS: no deformity or atrophy. Skin: warm and dry, no rash. Neuro:  Strength and sensation are intact. Psych: Normal affect.  Accessory Clinical Findings    ECG personally reviewed by me today -    - no EKG in office today.    Lab Results  Component Value Date   WBC 3.7 (L) 05/13/2024   WBC 3.6 (L) 05/13/2024   HGB 13.6 05/13/2024   HCT 40.0 05/13/2024   MCV 91.2 05/13/2024   MCV 91.3 05/13/2024   PLT 212 05/13/2024   PLT 215 05/13/2024   Lab Results  Component Value Date   CREATININE 0.70 05/13/2024   BUN 13 05/13/2024   NA 137 05/13/2024   K 5.8 (H) 05/13/2024   CL 101 05/13/2024   CO2 26 05/13/2024   Lab Results  Component Value Date   ALT 38 03/15/2024   AST 42 (H) 03/15/2024   ALKPHOS 57 03/15/2024   BILITOT 0.8 03/15/2024   Lab Results  Component Value Date   CHOL 234 (H) 03/25/2024   HDL 77 03/25/2024   LDLCALC 139 (H) 03/25/2024   TRIG 102 03/25/2024   CHOLHDL 3.0 03/25/2024    Lab Results  Component Value Date   HGBA1C 5.7 (H) 03/15/2024    Assessment & Plan    1. NICM/Takotsubo cardiomyopathy/nonobstructive CAD: She was hospitalized in 03/2024 in the setting of NSTEMI. Cardiac catheterization revealed minimal nonobstructive CAD, severe segmental LV dysfunction with periapical ballooning, hyperdynamic basal segments, LVEF estimated 35%, findings consistent with acute Takotsubo syndrome.   Echocardiogram in 03/2024 showed EF 40 to 45%, mildly decreased LV function, RWMA, normal RV systolic function, mild mitral valve regurgitation. She was seen in the ED on 05/13/2024 in the setting of chest pain, diaphoresis, shortness of breath. She  also reported left arm numbness, L facial numbness.  MRI, CT head were negative for acute findings. Troponin was negative x 2. She continues to report mild dyspnea on exertion, intermittent chest discomfort, she gets winded having long conversations and feels generally fatigued. Euvolemic and well  compensated on exam. She questions whether or not her symptoms which prompted yesterday's ED visit could have been related to a panic attack.  She mentions she does experience generalized anxiety.  We discussed management of anxiety symptoms, advised her to follow-up with PCP.  Will repeat echo.  Recent cardiac catheterization reassuring. Will trial 2-week statin holiday as below, will decrease hydrochlorothiazide  as below.  Continue to monitor symptoms.  Reviewed ED precautions.  Continue aspirin , losartan , Crestor  as below.  ADDENDUM 05/20/2024: Patient is cleared to  return to cardiac rehab.  2. Mitral valve regurgitation: Mild on most recent echo.  She does report some stable mild dyspnea on exertion, euvolemic well compensated on exam.  Repeat echo pending as above.  3. Bradycardia: Previously noted, heart rate stable in office today.  No longer on beta-blocker therapy in the setting of fatigue.  4. Hypertension: BP has been running low.  Additionally, she reports nocturnal leg cramps.  Potassium was low normal at 3.6 on 05/13/2024.  Will decrease hydrochlorothiazide  to 12.5 mg daily.  Continue to monitor BP and report SBP considered less than 110.  Continue losartan .  5. Hyperlipidemia: She reports nocturnal leg cramps, generalized fatigue.  She does also mention a history of prior stent tolerance.  Will trial 2-week statin holiday.  If symptoms improve, consider referral to lipid clinic Pharm.D. for consideration of PCSK9 inhibitor versus reduced dose statin therapy.  If no improvement in symptoms, resume Crestor .   6. Disposition: Follow-up in 06/2024, sooner if needed.      Damien JAYSON Braver, NP 05/14/2024, 8:17 AM

## 2024-05-13 NOTE — Discharge Instructions (Addendum)
 While you were in the emergency room, you had CT scans done of your head that were normal.  You had a MRI of your brain that was also normal.  You also had blood work done that was normal.  We checked your troponin twice and these tests were negative.  Please follow-up with your cardiologist and continue your cardiac rehab.  Please follow-up with your primary care doctor.  Return to the emergency room if you develop any worsening chest pain, difficulty breathing, numbness, weakness or trouble with your vision.

## 2024-05-13 NOTE — ED Provider Notes (Signed)
 I took over care of the patient at 4:30 pm pending remainder of lab workup   Physical Exam  BP 121/77   Pulse (!) 59   Temp 97.7 F (36.5 C) (Oral)   Resp (!) 22   SpO2 100%   Physical Exam Vitals and nursing note reviewed.  Constitutional:      General: She is not in acute distress. HENT:     Head: Normocephalic.   Cardiovascular:     Rate and Rhythm: Normal rate and regular rhythm.  Pulmonary:     Effort: Pulmonary effort is normal.  Chest:     Chest wall: No mass.   Musculoskeletal:        General: Normal range of motion.     Right lower leg: No edema.     Left lower leg: No edema.   Skin:    General: Skin is warm.   Neurological:     Mental Status: She is alert.   Psychiatric:        Mood and Affect: Mood normal.     Procedures    ED Course / MDM    Medical Decision Making I took over care of this patient at 4:30 PM pending remainder of workup and ultimate disposition.  Review of vitals show she has been stable in the ER she appears in no acute distress is feeling overall improved.  Troponins have been negative.  Discussed all results and plan with patient she will plan to follow-up outpatient and return for any worsening symptoms.  Problems Addressed: Nonspecific chest pain: undiagnosed new problem with uncertain prognosis  Amount and/or Complexity of Data Reviewed External Data Reviewed: notes. Labs:  Decision-making details documented in ED Course. Radiology:  Decision-making details documented in ED Course. ECG/medicine tests:  Decision-making details documented in ED Course.  Risk OTC drugs.          Gennaro Duwaine CROME, DO 05/13/24 1705

## 2024-05-13 NOTE — Code Documentation (Signed)
 Stroke Response Nurse Documentation Code Documentation  Sherry Daniels is a 63 y.o. female arriving to [place]  via Private Vehicle on 05/13/2024 with past medical hx of HTN HLD GERD IBS and recent MI. On aspirin  81 mg daily. Code stroke was activated by ED.   Patient from home where she was LKW at 29 and now complaining of dizziness and left face and arm sensory loss. She states symptoms occurred after walking for 30 min in her home.  Stroke team at the bedside on patient arrival. Labs drawn and patient cleared for CT by Dr. Mannie. Patient to CT with team. NIHSS 1, see documentation for details and code stroke times. Patient with left decreased sensation on exam. The following imaging was completed:  CT Head and CTA. Patient is not a candidate for IV Thrombolytic due to symptoms too mild to treat. Patient is not a candidate for IR due to no LVO on advanced imaging..   Care Plan: NIHSS and BP q 30 min until 1545, then q 2 for 12 hours, then q 4.    Bedside handoff with ED RN Lauraine.    Kelee Cunningham Livengood  Stroke Response RN

## 2024-05-14 ENCOUNTER — Ambulatory Visit: Attending: Nurse Practitioner | Admitting: Nurse Practitioner

## 2024-05-14 ENCOUNTER — Encounter: Payer: Self-pay | Admitting: Nurse Practitioner

## 2024-05-14 ENCOUNTER — Telehealth: Payer: Self-pay

## 2024-05-14 VITALS — BP 116/74 | HR 77 | Ht 66.0 in | Wt 167.6 lb

## 2024-05-14 DIAGNOSIS — I428 Other cardiomyopathies: Secondary | ICD-10-CM

## 2024-05-14 DIAGNOSIS — I2510 Atherosclerotic heart disease of native coronary artery without angina pectoris: Secondary | ICD-10-CM | POA: Diagnosis not present

## 2024-05-14 DIAGNOSIS — R001 Bradycardia, unspecified: Secondary | ICD-10-CM

## 2024-05-14 DIAGNOSIS — I340 Nonrheumatic mitral (valve) insufficiency: Secondary | ICD-10-CM | POA: Diagnosis not present

## 2024-05-14 DIAGNOSIS — E785 Hyperlipidemia, unspecified: Secondary | ICD-10-CM

## 2024-05-14 DIAGNOSIS — I10 Essential (primary) hypertension: Secondary | ICD-10-CM

## 2024-05-14 NOTE — Patient Instructions (Signed)
 Medication Instructions:  DECREASE HYDROCHLOROTHIAZIDE  TO 12.5 MG DAILY HOLD ROSUVASTATIN  FOR 2 WEEKS (PLEASE LET US  KNOW HOW YOU ARE FEELING) *If you need a refill on your cardiac medications before your next appointment, please call your pharmacy*  Lab Work: NONE If you have labs (blood work) drawn today and your tests are completely normal, you will receive your results only by: MyChart Message (if you have MyChart) OR A paper copy in the mail If you have any lab test that is abnormal or we need to change your treatment, we will call you to review the results.  Testing/Procedures: [facility] Your physician has requested that you have an echocardiogram. Echocardiography is a painless test that uses sound waves to create images of your heart. It provides your doctor with information about the size and shape of your heart and how well your heart's chambers and valves are working. This procedure takes approximately one hour. There are no restrictions for this procedure. Please do NOT wear cologne, perfume, aftershave, or lotions (deodorant is allowed). Please arrive 15 minutes prior to your appointment time.  Please note: We ask at that you not bring children with you during ultrasound (echo/ vascular) testing. Due to room size and safety concerns, children are not allowed in the ultrasound rooms during exams. Our front office staff cannot provide observation of children in our lobby area while testing is being conducted. An adult accompanying a patient to their appointment will only be allowed in the ultrasound room at the discretion of the ultrasound technician under special circumstances. We apologize for any inconvenience.   Follow-Up: At [facility], you and your health needs are our priority.  As part of our continuing mission to provide you with exceptional heart care, our providers are all part of one team.  This team includes your primary Cardiologist (physician) and  Advanced Practice Providers or APPs (Physician Assistants and Nurse Practitioners) who all work together to provide you with the care you need, when you need it.  Your next appointment:   EARLY AUGUST POST ECHO  Provider:   Jackee Alberts, NP or Damien Braver, NP, OR  Darryle ONEIDA Decent, MD  Other Instructions MONITOR BLOOD PRESSURE, RECORD SYSTOLIC BLOOD PRESSURE CONSISTENTLY LESS THAN 110.

## 2024-05-14 NOTE — Transitions of Care (Post Inpatient/ED Visit) (Signed)
   05/14/2024  Name: Sherry Daniels MRN: [id number] DOB: 21-Mar-1961  Today's TOC FU Call Status: Today's TOC FU Call Status:: Unsuccessful Call (1st Attempt) Unsuccessful Call (1st Attempt) Date: 05/14/24  Attempted to reach the patient regarding the most recent Inpatient/ED visit.  Follow Up Plan: Additional outreach attempts will be made to reach the patient to complete the Transitions of Care (Post Inpatient/ED visit) call.   Signature  [employer], [place]

## 2024-05-15 ENCOUNTER — Telehealth: Payer: Self-pay

## 2024-05-15 DIAGNOSIS — I5020 Unspecified systolic (congestive) heart failure: Secondary | ICD-10-CM

## 2024-05-15 DIAGNOSIS — Z79899 Other long term (current) drug therapy: Secondary | ICD-10-CM

## 2024-05-15 NOTE — Transitions of Care (Post Inpatient/ED Visit) (Signed)
   05/15/2024  Name: Sherry Daniels MRN: [id number] DOB: [birth year]  Today's TOC FU Call Status: Today's TOC FU Call Status:: Unsuccessful Call (2nd Attempt) Unsuccessful Call (2nd Attempt) Date: 05/15/24  Attempted to reach the patient regarding the most recent Inpatient/ED visit.  Follow Up Plan: Additional outreach attempts will be made to reach the patient to complete the Transitions of Care (Post Inpatient/ED visit) call.   Signature  [employer], [place]

## 2024-05-16 ENCOUNTER — Encounter: Payer: Self-pay | Admitting: Nurse Practitioner

## 2024-05-18 ENCOUNTER — Other Ambulatory Visit: Payer: Self-pay | Admitting: Physician Assistant

## 2024-05-18 ENCOUNTER — Other Ambulatory Visit: Payer: Self-pay | Admitting: Nurse Practitioner

## 2024-05-18 DIAGNOSIS — E782 Mixed hyperlipidemia: Secondary | ICD-10-CM

## 2024-05-18 DIAGNOSIS — E559 Vitamin D deficiency, unspecified: Secondary | ICD-10-CM

## 2024-05-18 DIAGNOSIS — I214 Non-ST elevation (NSTEMI) myocardial infarction: Secondary | ICD-10-CM

## 2024-05-19 ENCOUNTER — Inpatient Hospital Stay (HOSPITAL_COMMUNITY): Admission: RE | Admit: 2024-05-19 | Source: Ambulatory Visit

## 2024-05-20 ENCOUNTER — Telehealth: Payer: Self-pay

## 2024-05-20 MED ORDER — HYDROCHLOROTHIAZIDE 12.5 MG PO TABS: 12.5000 mg | ORAL_TABLET | Freq: Every day | ORAL | 3 refills | Status: DC

## 2024-05-20 NOTE — Transitions of Care (Post Inpatient/ED Visit) (Signed)
   05/20/2024  Name: Sherry Daniels MRN: [id number] DOB: May 13, 1961  Today's TOC FU Call Status: Today's TOC FU Call Status:: Unsuccessful Call (3rd Attempt) Unsuccessful Call (3rd Attempt) Date: 05/20/24  Attempted to reach the patient regarding the most recent Inpatient/ED visit.  Follow Up Plan: No further outreach attempts will be made at this time. We have been unable to contact the patient.  Signature  [employer], [place]

## 2024-05-21 ENCOUNTER — Telehealth (HOSPITAL_COMMUNITY): Payer: Self-pay | Admitting: *Deleted

## 2024-05-21 ENCOUNTER — Ambulatory Visit (HOSPITAL_COMMUNITY)

## 2024-05-21 ENCOUNTER — Ambulatory Visit: Payer: Self-pay

## 2024-05-21 NOTE — Telephone Encounter (Signed)
 FYI Only or Action Required?: FYI only for provider.  Patient was last seen in primary care on 03/25/2024 by Paseda, Folashade R, FNP.  Called Nurse Triage reporting Anxiety.  Symptoms began several months ago.  Interventions attempted: Other: mindfulness, breathing.  Symptoms are: unchanged.  Triage Disposition: No disposition on file.  Patient/caregiver understands and will follow disposition?:  Apt tomorrow.       Copied from CRM [id number]. Topic: Clinical - Red Word Triage >> May 21, 2024  9:30 AM Avram MATSU wrote: Red Word that prompted transfer to Nurse Triage: anxiety/stressed   ----------------------------------------------------------------------- From previous Reason for Contact - Scheduling: Patient/patient representative is calling to schedule an appointment. Refer to attachments for appointment information. Reason for Disposition  Patient sounds very upset or troubled to the triager  Answer Assessment - Initial Assessment Questions 1. CONCERN: Did anything happen that prompted you to call today?      Overwhelmed, stress 2. ANXIETY SYMPTOMS: Can you describe how you (your loved one; patient) have been feeling? (e.g., tense, restless, panicky, anxious, keyed up, overwhelmed, sense of impending doom).      Overwhelmed, stressed, flight response mode 3. ONSET: How long have you been feeling this way? 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 SEVERITY: How would you rate the level of anxiety? (e.g., 0 - 10; or mild, moderate, severe).     At times severe 5. FUNCTIONAL IMPAIRMENT: How have these feelings affected your ability to do daily activities? Have you had more difficulty than usual doing your normal daily activities? (e.g., getting better, same, worse; self-care, school, work, interactions)     yes 6. HISTORY: Have you felt this way before? Have you ever been diagnosed with an anxiety problem in the past? (e.g.,  generalized anxiety disorder, panic attacks, PTSD). If Yes, ask: How was this problem treated? (e.g., medicines, counseling, etc.)     Yes, since May 7. RISK OF HARM - SUICIDAL IDEATION: Do you ever have thoughts of hurting or killing yourself? If Yes, ask:  Do you have these feelings now? Do you have a plan on how you would do this?     no 8. TREATMENT:  What has been done so far to treat this anxiety? (e.g., medicines, relaxation strategies). What has helped?     Breathing, mindfulness 9. TREATMENT - THERAPIST: Do you have a counselor or therapist? Name?     no 10. POTENTIAL TRIGGERS: Do you drink caffeinated beverages (e.g., coffee, colas, teas), and how much daily? Do you drink alcohol or use any drugs? Have you started any new medicines recently?       Thoughts of having another heart attack, worse at night 11. PATIENT SUPPORT: Who is with you now? Who do you live with? Do you have family or friends who you can talk to?        yes 12. OTHER SYMPTOMS: Do you have any other symptoms? (e.g., feeling depressed, trouble concentrating, trouble sleeping, trouble breathing, palpitations or fast heartbeat, chest pain, sweating, nausea, or diarrhea)       Sweating, palpitations, seen cardiology recently 13. PREGNANCY: Is there any chance you are pregnant? When was your last menstrual period?       na  Protocols used: Anxiety and Panic Attack-A-AH

## 2024-05-21 NOTE — Telephone Encounter (Signed)
-----   Message from Damien JAYSON Braver sent at 05/20/2024  3:25 PM EDT ----- See addended progress note. She is ok to resume exercise. Thanks -EM

## 2024-05-22 ENCOUNTER — Encounter: Payer: Self-pay | Admitting: Nurse Practitioner

## 2024-05-22 ENCOUNTER — Ambulatory Visit (INDEPENDENT_AMBULATORY_CARE_PROVIDER_SITE_OTHER): Payer: Self-pay | Admitting: Nurse Practitioner

## 2024-05-22 VITALS — BP 139/67 | HR 56 | Wt 170.0 lb

## 2024-05-22 DIAGNOSIS — F419 Anxiety disorder, unspecified: Secondary | ICD-10-CM

## 2024-05-22 MED ORDER — ALPRAZOLAM 0.25 MG PO TABS: 0.2500 mg | ORAL_TABLET | Freq: Two times a day (BID) | ORAL | 0 refills | Status: DC | PRN

## 2024-05-22 MED ORDER — SERTRALINE HCL 25 MG PO TABS: 25.0000 mg | ORAL_TABLET | Freq: Every day | ORAL | 3 refills | Status: DC

## 2024-05-22 NOTE — Progress Notes (Signed)
 Subjective   Patient ID: Sherry Daniels, female    DOB: 27-May-1961, 63 y.o.   MRN: [id number]  Chief Complaint  Patient presents with   Anxiety    Referring provider: Paseda, Folashade R, FNP  Sherry Daniels is a 63 y.o. female with Past Medical History: No date: Allergy No date: Asthma 11/13/2009: Chest pain     Comment:  overnight hospitalization No date: Constipation     Comment:  Dr. Kristie No date: Fibromyalgia No date: GERD (gastroesophageal reflux disease) No date: Hyperlipidemia No date: Hypertension No date: Migraine 03/25/2024: Non-ischemic cardiomyopathy (HCC) No date: Nonalcoholic fatty liver disease  HPI:   Anxiety Presents for initial visit. Onset was 1 to 4 weeks ago. The problem has been gradually worsening. Symptoms include chest pain, depressed mood, muscle tension and panic. Symptoms occur most days. The severity of symptoms is moderate. The quality of sleep is poor. Nighttime awakenings: several.   Risk factors include family history and a major life event. Her past medical history is significant for anxiety/panic attacks. Past treatments include lifestyle changes.    Allergies  Allergen Reactions   Aspirin  Nausea Only   Codeine Nausea Only   Cymbalta  [Duloxetine  Hcl]     Felt like it worsened anxiety and fibromyalgia   Simvastatin Other (See Comments)    dizziness    Immunization History  Administered Date(s) Administered   PFIZER(Purple Top)SARS-COV-2 Vaccination 03/22/2020, 04/12/2020    Tobacco History: Social History   Tobacco Use  Smoking Status Never   Passive exposure: Never  Smokeless Tobacco Never   Counseling given: Not Answered   Outpatient Encounter Medications as of 05/22/2024  Medication Sig   acetaminophen  (TYLENOL ) 500 MG tablet Take 1,500 mg by mouth every 6 (six) hours as needed for mild pain (pain score 1-3). Reported on 05/01/2016   albuterol  (PROVENTIL ) (2.5 MG/3ML) 0.083% nebulizer solution  Take 3 mLs (2.5 mg total) by nebulization every 6 (six) hours as needed for wheezing or shortness of breath.   albuterol  (VENTOLIN  HFA) 108 (90 Base) MCG/ACT inhaler USE 2 PUFFS EVERY 4 TO 6 HOURS AS NEEDED   ALPRAZolam  (XANAX ) 0.25 MG tablet Take 1 tablet (0.25 mg total) by mouth 2 (two) times daily as needed for anxiety.   aspirin  81 MG EC tablet 1 tablet po qOD   estradiol (ESTRACE) 0.1 MG/GM vaginal cream Place 1 g vaginally 3 (three) times a week. Apply 1 application on Monday, Wednesdays, and Fridays per patient   estradiol (VIVELLE-DOT) 0.1 MG/24HR patch Place 1 patch onto the skin 2 (two) times a week.   hydrochlorothiazide  (HYDRODIURIL ) 12.5 MG tablet Take 1 tablet (12.5 mg total) by mouth daily.   losartan  (COZAAR ) 50 MG tablet Take 1 tablet (50 mg total) by mouth daily.   Misc Natural Products (TART CHERRY ADVANCED PO) Take 1 tablet by mouth daily.   Multiple Vitamins-Minerals (MULTIVITAMIN WITH MINERALS) tablet Take 1 tablet by mouth daily.   Omega-3 1000 MG CAPS Take 1 capsule by mouth daily.   promethazine  (PHENERGAN ) 12.5 MG tablet Take 1 tablet (12.5 mg total) by mouth every 8 (eight) hours as needed for nausea or vomiting.   rosuvastatin  (CRESTOR ) 20 MG tablet TAKE 1 TABLET BY MOUTH EVERY DAY   sertraline  (ZOLOFT ) 25 MG tablet Take 1 tablet (25 mg total) by mouth daily.   SUMAtriptan  (IMITREX ) 50 MG tablet Take 1 tablet (50 mg total) by mouth 2 (two) times daily as needed for migraine. May repeat in 2 hours if headache  persists or recurs.   TURMERIC PO Take 1 tablet by mouth daily at 6 (six) AM.   Vitamin D , Ergocalciferol , (DRISDOL ) 1.25 MG (50000 UNIT) CAPS capsule Take 1 capsule (50,000 Units total) by mouth every 7 (seven) days.   No facility-administered encounter medications on file as of 05/22/2024.    Review of Systems  Review of Systems  Constitutional: Negative.   HENT: Negative.    Cardiovascular:  Positive for chest pain.  Gastrointestinal: Negative.    Allergic/Immunologic: Negative.   Neurological: Negative.   Psychiatric/Behavioral: Negative.       Objective:   BP 139/67   Pulse (!) 56   Wt 170 lb (77.1 kg)   SpO2 100%   BMI 27.44 kg/m   Wt Readings from Last 5 Encounters:  05/22/24 170 lb (77.1 kg)  05/14/24 167 lb 9.6 oz (76 kg)  05/12/24 170 lb 3.1 oz (77.2 kg)  04/01/24 166 lb 9.6 oz (75.6 kg)  03/25/24 167 lb (75.8 kg)     Physical Exam Vitals and nursing note reviewed.  Constitutional:      General: She is not in acute distress.    Appearance: She is well-developed.  Cardiovascular:     Rate and Rhythm: Normal rate and regular rhythm.  Pulmonary:     Effort: Pulmonary effort is normal.     Breath sounds: Normal breath sounds.  Neurological:     Mental Status: She is alert and oriented to person, place, and time.       Assessment & Plan:   Anxiety -     ALPRAZolam ; Take 1 tablet (0.25 mg total) by mouth 2 (two) times daily as needed for anxiety.  Dispense: 20 tablet; Refill: 0 -     Sertraline  HCl; Take 1 tablet (25 mg total) by mouth daily.  Dispense: 30 tablet; Refill: 3     Return in about 4 weeks (around 06/19/2024).   Bascom GORMAN Borer, NP 05/22/2024

## 2024-05-23 ENCOUNTER — Ambulatory Visit (HOSPITAL_COMMUNITY)

## 2024-05-26 ENCOUNTER — Ambulatory Visit (HOSPITAL_COMMUNITY)

## 2024-05-28 ENCOUNTER — Ambulatory Visit (HOSPITAL_COMMUNITY)

## 2024-05-30 ENCOUNTER — Ambulatory Visit (HOSPITAL_COMMUNITY)

## 2024-06-02 ENCOUNTER — Ambulatory Visit (HOSPITAL_COMMUNITY)

## 2024-06-03 ENCOUNTER — Telehealth (HOSPITAL_COMMUNITY): Payer: Self-pay

## 2024-06-03 NOTE — Telephone Encounter (Signed)
 Carlette confirmed patient ready to reschedule. Attempted to call patient to reschedule CR orientation- no answer, unable to leave message. Sent MyChart message.

## 2024-06-04 ENCOUNTER — Ambulatory Visit (HOSPITAL_COMMUNITY)

## 2024-06-06 ENCOUNTER — Ambulatory Visit (HOSPITAL_COMMUNITY)

## 2024-06-09 ENCOUNTER — Ambulatory Visit (HOSPITAL_COMMUNITY)

## 2024-06-09 ENCOUNTER — Other Ambulatory Visit: Payer: Self-pay | Admitting: Nurse Practitioner

## 2024-06-09 DIAGNOSIS — F419 Anxiety disorder, unspecified: Secondary | ICD-10-CM

## 2024-06-09 MED ORDER — ALPRAZOLAM 0.25 MG PO TABS: 0.2500 mg | ORAL_TABLET | Freq: Two times a day (BID) | ORAL | 0 refills | Status: DC | PRN

## 2024-06-09 NOTE — Progress Notes (Unsigned)
 1. Anxiety  - ALPRAZolam  (XANAX ) 0.25 MG tablet; Take 1 tablet (0.25 mg total) by mouth 2 (two) times daily as needed for anxiety.  Dispense: 10 tablet; Refill: 0

## 2024-06-11 ENCOUNTER — Ambulatory Visit (HOSPITAL_COMMUNITY)

## 2024-06-11 ENCOUNTER — Telehealth (HOSPITAL_COMMUNITY): Payer: Self-pay

## 2024-06-11 NOTE — Telephone Encounter (Signed)
 Patient called back to reschedule cardiac rehab, states she is unable to do more than 1x day per week. Informed patient the minimum attendance for cardiac rehab is 2x days per week. Patient states she will check her schedule and call back.

## 2024-06-12 ENCOUNTER — Ambulatory Visit (HOSPITAL_COMMUNITY)
Admission: RE | Admit: 2024-06-12 | Discharge: 2024-06-12 | Disposition: A | Discharge status: Home / Self Care | Source: Ambulatory Visit | Attending: Cardiology | Admitting: Cardiology

## 2024-06-12 DIAGNOSIS — I428 Other cardiomyopathies: Secondary | ICD-10-CM | POA: Insufficient documentation

## 2024-06-12 LAB — ECHOCARDIOGRAM COMPLETE
AR max vel: 2.31 cm2
AV Area VTI: 2.03 cm2
AV Area mean vel: 2.15 cm2
AV Mean grad: 3 mmHg
AV Peak grad: 5.7 mmHg
Ao pk vel: 1.19 m/s
Area-P 1/2: 3.37 cm2
S' Lateral: 2.7 cm

## 2024-06-13 ENCOUNTER — Ambulatory Visit (HOSPITAL_COMMUNITY)

## 2024-06-13 ENCOUNTER — Other Ambulatory Visit: Payer: Self-pay | Admitting: Nurse Practitioner

## 2024-06-13 DIAGNOSIS — F419 Anxiety disorder, unspecified: Secondary | ICD-10-CM

## 2024-06-13 LAB — BASIC METABOLIC PANEL WITH GFR
BUN/Creatinine Ratio: 14 (ref 12–28)
BUN: 10 mg/dL (ref 8–27)
CO2: 23 mmol/L (ref 20–29)
Calcium: 9.6 mg/dL (ref 8.7–10.3)
Chloride: 102 mmol/L (ref 96–106)
Creatinine, Ser: 0.72 mg/dL (ref 0.57–1.00)
Glucose: 91 mg/dL (ref 70–99)
Potassium: 3.9 mmol/L (ref 3.5–5.2)
Sodium: 141 mmol/L (ref 134–144)
eGFR: 96 mL/min/1.73 (ref >59)

## 2024-06-15 ENCOUNTER — Ambulatory Visit: Payer: Self-pay | Admitting: Nurse Practitioner

## 2024-06-16 ENCOUNTER — Ambulatory Visit (HOSPITAL_COMMUNITY)

## 2024-06-17 ENCOUNTER — Telehealth (HOSPITAL_COMMUNITY): Payer: Self-pay

## 2024-06-17 NOTE — Telephone Encounter (Signed)
 Attempted f/u call to reschedule cardiac rehab- no answer, unable to leave message. Sent MyChart message.  Closing referral.

## 2024-06-18 ENCOUNTER — Ambulatory Visit (HOSPITAL_COMMUNITY)

## 2024-06-20 ENCOUNTER — Ambulatory Visit (HOSPITAL_COMMUNITY)

## 2024-06-23 ENCOUNTER — Ambulatory Visit (HOSPITAL_COMMUNITY)

## 2024-06-25 ENCOUNTER — Ambulatory Visit: Payer: Self-pay | Admitting: Nurse Practitioner

## 2024-06-25 ENCOUNTER — Ambulatory Visit (HOSPITAL_COMMUNITY)

## 2024-06-27 ENCOUNTER — Ambulatory Visit (HOSPITAL_COMMUNITY)

## 2024-06-28 ENCOUNTER — Other Ambulatory Visit: Payer: Self-pay | Admitting: Nurse Practitioner

## 2024-06-28 DIAGNOSIS — F419 Anxiety disorder, unspecified: Secondary | ICD-10-CM

## 2024-06-30 ENCOUNTER — Ambulatory Visit (HOSPITAL_COMMUNITY)

## 2024-06-30 ENCOUNTER — Other Ambulatory Visit: Payer: Self-pay | Admitting: Nurse Practitioner

## 2024-06-30 DIAGNOSIS — F419 Anxiety disorder, unspecified: Secondary | ICD-10-CM

## 2024-06-30 NOTE — Telephone Encounter (Signed)
 Please advise [facility]

## 2024-07-02 ENCOUNTER — Ambulatory Visit (HOSPITAL_COMMUNITY)

## 2024-07-02 NOTE — Progress Notes (Deleted)
 Cardiology Office Note    Date:  07/02/2024  ID:  [name], [name] [birth year], MRN [id number] PCP:  Paseda, Folashade R, FNP  Cardiologist:  Darryle ONEIDA Decent, MD  Electrophysiologist:  None   Chief Complaint: ***  History of Present Illness: Sherry    Sherry Daniels is a 63 y.o. female with visit-pertinent history of nonobstructive CAD, NICM (Takotsubo CM), mitral valve regurgitation, bradycardia, hypertension, hyperlipidemia, fibromyalgia and GERD.  Patient was initially referred to cardiology in 2022 in setting of chest tightness and palpitations.  Coronary CTA in 09/2021 revealed coronary calcium  score of 0.  7-day ZIO monitor in 09/2021 revealed predominantly normal sinus rhythm, sinus bradycardia, sinus tachycardia, rare PACs and PVCs.  Patient was hospitalized in May 2025 in setting of NSTEMI.  Cardiac catheterization revealed minimal nonobstructive CAD, severe segmental LV dysfunction with periapical ballooning, hyperdynamic basal segments, LVEF estimated 35% findings consistent with a acute Takotsubo syndrome.  Patient was started on metoprolol .  Echocardiogram in 03/2024 showed EF 40 to 45%, mildly decreased LV function, RWMA, normal RV systolic function, mild mitral valve regurgitation.  Patient was seen in office on 04/01/2024 and was stable from a cardiac standpoint.  Metoprolol  was reduced to 12.5 mg twice daily in setting of symptomatic bradycardia and ultimately discontinued.  Repeat echocardiogram was recommended in 3 months.  On 05/13/2024 patient presented the ED in setting of chest pain, diaphoresis, shortness of breath.  She also reported left arm numbness, left facial numbness.  MRI, CT of the head were negative for acute findings.  Troponin was negative x 2 and patient was discharged home.  Patient was seen in clinic on 05/14/2024 for follow-up.  Patient reported some mild dyspnea on exertion, intermittent chest discomfort and getting winded having long  conversations and generally felt fatigued.  She also reported nocturnal leg cramps.  She did report some improvement following discontinuation metoprolol  however have symptoms have since worsened.  She questioned if her ED visit the prior day was related to an anxiety attack.  Echocardiogram on 06/12/2024 indicated LVEF 60 to 65%, no wall motion abnormalities, RV systolic function and size was normal, normal PASP, mild mitral valve regurgitation with no evidence of stenosis.   NICM/Takotsubo cardiomyopathy/nonobstructive CAD: Patient hospitalized in 03/2024 in setting of NSTEMI.  Cardiac catheterization revealed minimal nonobstructive CAD, severe segmental LV dysfunction with periapical ballooning, hyperdynamic basal segments, LVEF estimated 35%, findings consistent with acute Takotsubo syndrome.  Echo in 03/2024 showed EF 40 to 45%, mildly decreased LV function, RWMA, normal RV systolic function, mild mitral valve regurgitation. Echocardiogram on 06/12/2024 indicated LVEF 60 to 65%, no wall motion abnormalities, RV systolic function and size was normal, normal PASP, mild mitral valve regurgitation with no evidence of stenosis. Today she reports Mitral valve regurgitation: Mild on most recent echo. Bradycardia: Patient no longer on beta-blocker therapy in setting of fatigue.  Heart rate today Hypertension: Blood pressure today Hyperlipidemia:  Labwork independently reviewed:   ROS: .   *** denies chest pain, shortness of breath, lower extremity edema, fatigue, palpitations, melena, hematuria, hemoptysis, diaphoresis, weakness, presyncope, syncope, orthopnea, and PND.  All other systems are reviewed and otherwise negative.  Studies Reviewed: Sherry    EKG:  EKG is ordered today, personally reviewed, demonstrating ***     CV Studies: Cardiac studies reviewed are outlined and summarized above. Otherwise please see EMR for full report. Cardiac Studies & Procedures    ______________________________________________________________________________________________ CARDIAC CATHETERIZATION  CARDIAC CATHETERIZATION 03/14/2024  Conclusion 1.  Minimal nonobstructive coronary  artery disease with no significant stenoses 2.  Severe segmental LV dysfunction with periapical ballooning, hyperdynamic basal segments, LVEF estimated at 35%, findings consistent with acute Takotsubo syndrome  Recommendations: Will adjust her antihypertensive medications with discontinuation of amlodipine  and we will start her on metoprolol  succinate (beta-1 selective agent in the setting of asthma), and losartan .  Check 2D echo.  Counseled patient and family on findings, prognosis, and rationale for medical therapy.  Anticipate 24 to 48-hour hospitalization depending on her symptoms.  Findings Coronary Findings Diagnostic  Dominance: Right  Left Main Vessel is angiographically normal.  Left Anterior Descending The vessel exhibits minimal luminal irregularities. There is mild plaquing in the mid vessel with no significant stenosis.  The remaining portions of the LAD and its diagonal branches are widely patent.  Left Circumflex Vessel is angiographically normal.  Right Coronary Artery Vessel is large. Vessel is angiographically normal. Large, dominant vessel.  Angiographically normal.  The PDA and PLA branches are widely patent.  Intervention  No interventions have been documented.     ECHOCARDIOGRAM  ECHOCARDIOGRAM COMPLETE 06/12/2024  Narrative ECHOCARDIOGRAM REPORT    Patient Name:   Sherry Daniels Date of Exam: 06/12/2024 Medical Rec #:  [id number]               Height:       66.0 in Accession #:    [id number]              Weight:       170.0 lb Date of Birth:  1960-12-13               BSA:          1.866 m Patient Age:    62 years                BP:           137/94 mmHg Patient Gender: F                       HR:           64 bpm. Exam Location:  [address]  Procedure: 2D Echo, Cardiac Doppler, Color Doppler and Strain Analysis (Both Spectral and Color Flow Doppler were utilized during procedure).  Indications:    Nonischemic Cardiomyopathy I42.8  History:        Patient has prior history of Echocardiogram examinations. Nonischemic Cardiomyopathy, Signs/Symptoms:Chest Pain; Risk Factors:Hypertension and Hyperlipidemia.  Sonographer:    Sharlet Hamilton RDCS Referring Phys: [id number] EMILY C MONGE  IMPRESSIONS   1. Left ventricular ejection fraction, by estimation, is 60 to 65%. The left ventricle has normal function. The left ventricle has no regional wall motion abnormalities. The average left ventricular global longitudinal strain is -19.4 %. The global longitudinal strain is normal. 2. Right ventricular systolic function is normal. The right ventricular size is normal. There is normal pulmonary artery systolic pressure. The estimated right ventricular systolic pressure is 16.5 mmHg. 3. The mitral valve is normal in structure. Mild mitral valve regurgitation. No evidence of mitral stenosis. 4. The aortic valve is normal in structure. Aortic valve regurgitation is not visualized. No aortic stenosis is present. 5. The inferior vena cava is normal in size with greater than 50% respiratory variability, suggesting right atrial pressure of 3 mmHg.  Comparison(s): A prior study was performed on 03/15/2024. LVEF improved from 40-45% to 60-65%.  FINDINGS Left Ventricle: Left ventricular ejection fraction, by estimation, is 60 to 65%. The left  ventricle has normal function. The left ventricle has no regional wall motion abnormalities. The average left ventricular global longitudinal strain is -19.4 %. Strain was performed and the global longitudinal strain is normal. The left ventricular internal cavity size was normal in size. There is borderline left ventricular hypertrophy.  Right Ventricle: The right ventricular size is normal. No increase in  right ventricular wall thickness. Right ventricular systolic function is normal. There is normal pulmonary artery systolic pressure. The tricuspid regurgitant velocity is 1.84 m/s, and with an assumed right atrial pressure of 3 mmHg, the estimated right ventricular systolic pressure is 16.5 mmHg.  Left Atrium: Left atrial size was normal in size.  Right Atrium: Right atrial size was normal in size.  Pericardium: There is no evidence of pericardial effusion.  Mitral Valve: The mitral valve is normal in structure. Mild mitral valve regurgitation. No evidence of mitral valve stenosis.  Tricuspid Valve: The tricuspid valve is normal in structure. Tricuspid valve regurgitation is trivial. No evidence of tricuspid stenosis.  Aortic Valve: The aortic valve is normal in structure. Aortic valve regurgitation is not visualized. No aortic stenosis is present. Aortic valve mean gradient measures 3.0 mmHg. Aortic valve peak gradient measures 5.7 mmHg. Aortic valve area, by VTI measures 2.03 cm.  Pulmonic Valve: The pulmonic valve was normal in structure. Pulmonic valve regurgitation is mild. No evidence of pulmonic stenosis.  Aorta: The aortic root is normal in size and structure.  Venous: The inferior vena cava is normal in size with greater than 50% respiratory variability, suggesting right atrial pressure of 3 mmHg.  IAS/Shunts: The atrial septum is grossly normal.   LEFT VENTRICLE PLAX 2D LVIDd:         3.90 cm   Diastology LVIDs:         2.70 cm   LV e' medial:    6.85 cm/s LV PW:         1.10 cm   LV E/e' medial:  8.3 LV IVS:        1.10 cm   LV e' lateral:   10.00 cm/s LVOT diam:     1.80 cm   LV E/e' lateral: 5.7 LV SV:         55 LV SV Index:   29        2D Longitudinal Strain LVOT Area:     2.54 cm  2D Strain GLS (A4C):   -22.5 % 2D Strain GLS (A3C):   -17.9 % 2D Strain GLS (A2C):   -17.8 % 2D Strain GLS Avg:     -19.4 %  RIGHT VENTRICLE             IVC RV Basal diam:  3.20 cm      IVC diam: 1.40 cm RV Mid diam:    2.70 cm RV S prime:     16.40 cm/s TAPSE (M-mode): 2.4 cm RVSP:           16.5 mmHg  LEFT ATRIUM             Index        RIGHT ATRIUM           Index LA Vol (A2C):   32.5 ml 17.41 ml/m  RA Pressure: 3.00 mmHg LA Vol (A4C):   35.1 ml 18.81 ml/m  RA Area:     15.50 cm LA Biplane Vol: 34.0 ml 18.22 ml/m  RA Volume:   39.80 ml  21.33 ml/m AORTIC VALVE  PULMONIC VALVE AV Area (Vmax):    2.31 cm     PR End Diast Vel: 3.44 msec AV Area (Vmean):   2.15 cm AV Area (VTI):     2.03 cm AV Vmax:           119.00 cm/s AV Vmean:          78.900 cm/s AV VTI:            0.271 m AV Peak Grad:      5.7 mmHg AV Mean Grad:      3.0 mmHg LVOT Vmax:         108.00 cm/s LVOT Vmean:        66.800 cm/s LVOT VTI:          0.216 m LVOT/AV VTI ratio: 0.80  AORTA Ao Root diam: 2.80 cm Ao Asc diam:  3.20 cm  MITRAL VALVE               TRICUSPID VALVE MV Area (PHT): 3.37 cm    TR Peak grad:   13.5 mmHg MV Decel Time: 225 msec    TR Vmax:        184.00 cm/s MV E velocity: 57.10 cm/s  Estimated RAP:  3.00 mmHg MV A velocity: 72.20 cm/s  RVSP:           16.5 mmHg MV E/A ratio:  0.79 SHUNTS Systemic VTI:  0.22 m Systemic Diam: 1.80 cm  Sunit Tolia Electronically signed by Madonna Large Signature Date/Time: 06/12/2024/4:30:02 PM    Final    MONITORS  LONG TERM MONITOR (3-14 DAYS) 09/27/2021  Narrative Enrollment 09/09/2021-09/14/2021 (5 days 11 hours). Patient had a min HR of 52 bpm (sinus bradycardia), max HR of 152 bpm (sinus tachycardia), and avg HR of 74 bpm (normal sinus rhythm). Predominant underlying rhythm was Sinus Rhythm. Isolated SVEs were rare (<1.0%), and no SVE Couplets or SVE Triplets were present. Isolated VEs were rare (<1.0%), VE Couplets were rare (<1.0%), and no VE Triplets were present.  09/09/21 08:40pm Light headed,Light sweating coincided with normal sinus rhythm 68 bpm. 09/10/21 02:25am Chest pain or  pressure,Dizziness,Light headed coincided with normal sinus rhythm 68 bpm. 09/10/21 05:05am Fluttering or racing,Light headed,Sweating coincided with normal sinus rhythm 66 bpm. 09/10/21 11:30am Anxious,Chest pain or pressure,Light headed,Mild chest pressure coincided with normal sinus rhythm 72 bpm. 09/11/21 09:07pm Short of breath coincided with normal sinus rhythm 76 bpm.  Impression: 1. No arrhythmias detected. 2. Rare ectopy.  Darryle T. Barbaraann, MD, [facility]  [facility] [address] [place], [place] [address] [phone] 7:25 PM   CT SCANS  CT CORONARY MORPH W/CTA COR W/SCORE 09/16/2021  Addendum 09/16/2021 11:10 AM ADDENDUM REPORT: 09/16/2021 11:07  CLINICAL DATA:  Chest pain  EXAM: Cardiac CTA  MEDICATIONS: Sub lingual nitro. 4 mg and lopressor  100mg   TECHNIQUE: The patient was scanned on a [employer] 192 scanner. Gantry rotation speed was 250 msecs. Collimation was. 6 mm . A 120 kV prospective scan was triggered in the ascending thoracic aorta at 140 HU's with full mA between 30-70% of the R-R interval . Average HR during the scan was 47 bpm. The 3D data set was interpreted on a dedicated work station using MPR, MIP and VRT modes. A total of 80 cc of contrast was used.  FINDINGS: Non-cardiac: See separate report from [facility]. No significant findings on limited lung and soft tissue windows.  Calcium  score: No calcium  noted  Coronary Arteries: Right dominant  with no anomalies  LM: Normal  LAD: Normal  D1: Normal  D2: Normal  Circumflex: Normal  OM1: Normal  OM2: Normal  RCA: Normal  PDA: Normal  PLA: Normal  IMPRESSION: 1.  Calcium  score 0  2.  Normal ascending thoracic aorta 3.1 cm  3.  Normal right dominant coronary arteries  Maude Emmer   Electronically Signed By: Maude Emmer M.D. On: 09/16/2021 11:07  Narrative EXAM: OVER-READ INTERPRETATION  CT CHEST  The following report is an  over-read performed by radiologist Dr. Toribio Aye of [facility] on 09/16/2021. This over-read does not include interpretation of cardiac or coronary anatomy or pathology. The coronary calcium  score/coronary CTA interpretation by the cardiologist is attached.  COMPARISON:  None.  FINDINGS: Within the visualized portions of the thorax there are no suspicious appearing pulmonary nodules or masses, there is no acute consolidative airspace disease, no pleural effusions, no pneumothorax and no lymphadenopathy. Visualized portions of the upper abdomen are unremarkable. There are no aggressive appearing lytic or blastic lesions noted in the visualized portions of the skeleton.  IMPRESSION: 1. No significant incidental noncardiac findings are noted.  Electronically Signed: By: Toribio Aye M.D. On: 09/16/2021 10:11     ______________________________________________________________________________________________       Current Reported Medications:.    No outpatient medications have been marked as taking for the 07/04/24 encounter (Appointment) with Nehemie Casserly D, NP.    Physical Exam:    VS:  There were no vitals taken for this visit.   Wt Readings from Last 3 Encounters:  05/22/24 170 lb (77.1 kg)  05/14/24 167 lb 9.6 oz (76 kg)  05/12/24 170 lb 3.1 oz (77.2 kg)    GEN: Well nourished, well developed in no acute distress NECK: No JVD; No carotid bruits CARDIAC: ***RRR, no murmurs, rubs, gallops RESPIRATORY:  Clear to auscultation without rales, wheezing or rhonchi  ABDOMEN: Soft, non-tender, non-distended EXTREMITIES:  No edema; No acute deformity     Asessement and Plan:.     ***     Disposition: F/u with ***  Signed, Erna Brossard D Tanajah Boulter, NP

## 2024-07-04 ENCOUNTER — Ambulatory Visit: Attending: Cardiology | Admitting: Cardiology

## 2024-07-04 ENCOUNTER — Other Ambulatory Visit: Payer: Self-pay | Admitting: Nurse Practitioner

## 2024-07-04 DIAGNOSIS — I340 Nonrheumatic mitral (valve) insufficiency: Secondary | ICD-10-CM

## 2024-07-04 DIAGNOSIS — I5020 Unspecified systolic (congestive) heart failure: Secondary | ICD-10-CM

## 2024-07-04 DIAGNOSIS — Z79899 Other long term (current) drug therapy: Secondary | ICD-10-CM

## 2024-07-04 DIAGNOSIS — R001 Bradycardia, unspecified: Secondary | ICD-10-CM

## 2024-07-04 DIAGNOSIS — F419 Anxiety disorder, unspecified: Secondary | ICD-10-CM

## 2024-07-04 DIAGNOSIS — I2510 Atherosclerotic heart disease of native coronary artery without angina pectoris: Secondary | ICD-10-CM

## 2024-07-04 DIAGNOSIS — I428 Other cardiomyopathies: Secondary | ICD-10-CM

## 2024-07-04 MED ORDER — ALPRAZOLAM 0.25 MG PO TABS: 0.2500 mg | ORAL_TABLET | Freq: Two times a day (BID) | ORAL | 0 refills | Status: DC | PRN

## 2024-07-07 ENCOUNTER — Ambulatory Visit (HOSPITAL_COMMUNITY)

## 2024-07-09 ENCOUNTER — Ambulatory Visit (HOSPITAL_COMMUNITY)

## 2024-07-11 ENCOUNTER — Ambulatory Visit (HOSPITAL_COMMUNITY)

## 2024-07-11 ENCOUNTER — Other Ambulatory Visit: Payer: Self-pay | Admitting: Nurse Practitioner

## 2024-07-11 DIAGNOSIS — F419 Anxiety disorder, unspecified: Secondary | ICD-10-CM

## 2024-07-16 ENCOUNTER — Ambulatory Visit (HOSPITAL_COMMUNITY)

## 2024-07-18 ENCOUNTER — Ambulatory Visit (HOSPITAL_COMMUNITY)

## 2024-07-21 ENCOUNTER — Ambulatory Visit (HOSPITAL_COMMUNITY)

## 2024-07-21 HISTORY — PX: CARDIAC CATHETERIZATION: SHX172
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
## 2024-07-22 NOTE — Care Plan (Signed)
 Admission note:  Virtual Session conducted utilizing [email or web address]  TeleHealth protocols. Chart and case reviewed.  H&P 63 y.o. female whose presentation is complicated by Takotsubo cardiomyopathy (03/2024 most recent), CAD, MVR, bradycardia, HTN, asthma, HLD, GERD, fibromyalgia, NASH, anxiety that presented to [facility] with new onset chest pain.   Recent Vitals BP 100/63   Pulse 70   Temp 36.4 C (97.5 F) (Oral)   Resp 18   Ht 167.6 cm (5' 6)   Wt 71.7 kg (158 lb)   SpO2 100%   BMI 25.50 kg/m   Patient is A&O, AVSS, and amenable to virtual admission at this time.  Care plan and education assessment initiated/reviewed.  Falls precautions reviewed. Admission completed, and all questions answered. Patient verbalized understanding of teaching, denies additional needs or concerns at this time, and has a call bell in reach.  Problem: Fall Injury Risk Goal: Absence of Fall and Fall-Related Injury Outcome: Ongoing - Unchanged   Problem: Adult Inpatient Plan of Care Goal: Plan of Care Review Outcome: Ongoing - Unchanged Flowsheets (Taken 07/22/2024 1411) Plan of Care Reviewed With: patient Goal: Patient-Specific Goal (Individualized) Outcome: Ongoing - Unchanged Goal: Absence of Hospital-Acquired Illness or Injury Outcome: Ongoing - Unchanged Goal: Optimal Comfort and Wellbeing Outcome: Ongoing - Unchanged Goal: Readiness for Transition of Care Outcome: Ongoing - Unchanged Goal: Rounds/Family Conference Outcome: Ongoing - Unchanged

## 2024-07-22 NOTE — Nursing Note (Signed)
 Discharge order confirmed. Reviewed discharge instructions per AVS with patient and family. Interpreter Not needed. Teaching specifics (including return demonstration/RD where indicated) included: []  Flu vaccine administration:  []  JP drain care (RD) []  Biliary/Nephrostomy tube care (RD) []  Foley cath care (RD) []  Incentive Spirometer (RD) [x]  Incision/wound care (RD) []  Enoxaparin  sodium administration (RD) [x]  Lifting/activity/showering restrictions [x]  Other when to seek medical attention  [x]  Diet:  [x]  Medications: reviewed [x]  Prescriptions:  Imdur, Nitroglycerine handout to patient [x]  Follow-up appointments 10.16 All questions and concerns addressed. patient and family verbalized understanding. PIV removed with tip intact and site benign. AVS and all belongings with patient. Patient discharged to the lobby independently ambulatory with husband.

## 2024-07-22 NOTE — ED Notes (Signed)
 [facility] Emergency Department Attestation Note     ED Clinical Impression     Diagnosis ICD-10-CM Associated Orders  1. NSTEMI (non-ST elevated myocardial infarction)    (CMS-HCC)  I21.4 Cath/Vascular Case Request: Left Heart Catheterization    Cath/Vascular Case Request: Left Heart Catheterization    Cath/Vascular Procedure    Cath/Vascular Procedure         ED Attending Physician Teaching Attestation    I supervised care provided by the resident. We have discussed the case, I have reviewed the note and I agree with the plan of treatment except as documented in my note.  I have personally performed a face-to-face diagnostic evaluation on this patient.  Procedure performed: none    ED Attending Note    Outside Historian(s)     Vitals:   07/22/24 0730 07/22/24 1041 07/22/24 1230 07/22/24 1328  BP:  126/75  100/63  Pulse: 56 70    Resp:  18    Temp:  36.4 C (97.5 F)    TempSrc:  Oral    SpO2:  100%    Weight:      Height:   167.6 cm (5' 6)     Sherry Daniels is a 63 y.o. female with a history of Takotsubo cardiomyopathy presenting to the emergency department for evaluation of chest pain.  She was given aspirin  and nitro by EMS with improvement of symptoms.  Her initial exam is unremarkable.   Impression, Medical Decision Making and Plan of Care  Patient presents for evaluation of chest pain.  EKG obtained, no evidence of STEMI.  Labs, imaging studies obtained, independently reviewed and interpreted.  D-dimer was elevated and troponin trended upward.  CT chest showed no pulmonary embolism.  Patient was admitted for evaluation of NSTEMI.   Additional MDM Elements      Independent interpretation: EKG(s) -   X-ray(s) -   CT scan(s) -          I have reviewed the patient's vital signs and the nursing notes. Any pertinent labs & imaging results which were available during my care of the patient were reviewed by me. See chart, resident and nursing  documentation for additional ED course details.  Portions of this record have been created using Scientist, clinical (histocompatibility and immunogenetics). Dictation errors have been sought, but may not have been identified and corrected.

## 2024-07-23 ENCOUNTER — Ambulatory Visit (HOSPITAL_COMMUNITY)

## 2024-07-23 NOTE — Care Plan (Signed)
 Transition of Care Encounter Data   Call attempt: 1 Admission date: 07/21/24 Discharge date: 07/22/24 Discharge diagnosis: NSTEMI (non-ST elevated myocardial infarction) Do you have a hospital follow up appointment?: Yes - After 14 Days, Yes with Specialty Provider clinic F/U Date: 07/29/24 F/U Provider: Fairy Seton, MD Patient post discharge: Medications:      SABRA   [facility]: [phone]:  .  [facility]: [phone]:  .  Other: Contact PCP:        [facility] TRANSITIONAL CASE MANAGEMENT SUMMARY NOTE   Attempted to contact patient today at Cell to complete Transitional Case Management call from [facility]. No answer/unable to leave message; 1st attempt.          Delon CHRISTELLA Rea, RN

## 2024-07-24 ENCOUNTER — Other Ambulatory Visit: Payer: Self-pay

## 2024-07-24 ENCOUNTER — Emergency Department (HOSPITAL_COMMUNITY)

## 2024-07-24 ENCOUNTER — Telehealth: Payer: Self-pay

## 2024-07-24 ENCOUNTER — Emergency Department (HOSPITAL_COMMUNITY)
Admission: EM | Admit: 2024-07-24 | Discharge: 2024-07-24 | Disposition: A | Discharge status: Home / Self Care | Attending: Emergency Medicine | Admitting: Emergency Medicine

## 2024-07-24 ENCOUNTER — Ambulatory Visit: Payer: Self-pay

## 2024-07-24 DIAGNOSIS — R079 Chest pain, unspecified: Secondary | ICD-10-CM | POA: Diagnosis not present

## 2024-07-24 DIAGNOSIS — G43809 Other migraine, not intractable, without status migrainosus: Secondary | ICD-10-CM | POA: Diagnosis not present

## 2024-07-24 DIAGNOSIS — R519 Headache, unspecified: Secondary | ICD-10-CM | POA: Diagnosis present

## 2024-07-24 DIAGNOSIS — Z7982 Long term (current) use of aspirin: Secondary | ICD-10-CM | POA: Insufficient documentation

## 2024-07-24 LAB — I-STAT CHEM 8, ED
BUN: 8 mg/dL (ref 6–20)
Calcium, Ion: 1.25 mmol/L (ref 1.15–1.40)
Chloride: 105 mmol/L (ref 98–111)
Creatinine, Ser: 0.8 mg/dL (ref 0.44–1.00)
Glucose, Bld: 97 mg/dL (ref 70–99)
HCT: 36 % (ref 36.0–46.0)
Hemoglobin: 12.2 g/dL (ref 12.0–15.0)
Potassium: 3.8 mmol/L (ref 3.5–5.1)
Sodium: 142 mmol/L (ref 135–145)
TCO2: 26 mmol/L (ref 22–32)

## 2024-07-24 LAB — COMPREHENSIVE METABOLIC PANEL WITH GFR
ALT: 49 U/L — ABNORMAL HIGH (ref 0–44)
AST: 39 U/L (ref 15–41)
Albumin: 3.6 g/dL (ref 3.5–5.0)
Alkaline Phosphatase: 57 U/L (ref 38–126)
Anion gap: 7 (ref 5–15)
BUN: 7 mg/dL (ref 6–20)
CO2: 28 mmol/L (ref 22–32)
Calcium: 9.4 mg/dL (ref 8.9–10.3)
Chloride: 104 mmol/L (ref 98–111)
Creatinine, Ser: 0.76 mg/dL (ref 0.44–1.00)
GFR, Estimated: >60 mL/min (ref >60)
Glucose, Bld: 98 mg/dL (ref 70–99)
Potassium: 3.7 mmol/L (ref 3.5–5.1)
Sodium: 139 mmol/L (ref 135–145)
Total Bilirubin: 0.6 mg/dL (ref 0.0–1.2)
Total Protein: 7.5 g/dL (ref 6.5–8.1)

## 2024-07-24 LAB — CBC WITH DIFFERENTIAL/PLATELET
Abs Immature Granulocytes: 0.02 K/uL (ref 0.00–0.07)
Basophils Absolute: 0 K/uL (ref 0.0–0.1)
Basophils Relative: 1 %
Eosinophils Absolute: 0.2 K/uL (ref 0.0–0.5)
Eosinophils Relative: 4 %
HCT: 37.3 % (ref 36.0–46.0)
Hemoglobin: 12 g/dL (ref 12.0–15.0)
Immature Granulocytes: 0 %
Lymphocytes Relative: 40 %
Lymphs Abs: 1.9 K/uL (ref 0.7–4.0)
MCH: 29.7 pg (ref 26.0–34.0)
MCHC: 32.2 g/dL (ref 30.0–36.0)
MCV: 92.3 fL (ref 80.0–100.0)
Monocytes Absolute: 0.4 K/uL (ref 0.1–1.0)
Monocytes Relative: 8 %
Neutro Abs: 2.2 K/uL (ref 1.7–7.7)
Neutrophils Relative %: 47 %
Platelets: 233 K/uL (ref 150–400)
RBC: 4.04 MIL/uL (ref 3.87–5.11)
RDW: 13.2 % (ref 11.5–15.5)
WBC: 4.7 K/uL (ref 4.0–10.5)
nRBC: 0 % (ref 0.0–0.2)

## 2024-07-24 LAB — TROPONIN I (HIGH SENSITIVITY)
Troponin I (High Sensitivity): 58 ng/L — ABNORMAL HIGH (ref <18)
Troponin I (High Sensitivity): 56 ng/L — ABNORMAL HIGH (ref <18)

## 2024-07-24 MED ORDER — DIPHENHYDRAMINE HCL 50 MG/ML IJ SOLN
12.5000 mg | Freq: Once | INTRAMUSCULAR | Status: AC
  Administered 2024-07-24: 12.5 mg via INTRAVENOUS
  Filled 2024-07-24: qty 1

## 2024-07-24 MED ORDER — MAGNESIUM SULFATE 2 GM/50ML IV SOLN
2.0000 g | Freq: Once | INTRAVENOUS | Status: AC
  Administered 2024-07-24: 2 g via INTRAVENOUS
  Filled 2024-07-24: qty 50

## 2024-07-24 MED ORDER — SODIUM CHLORIDE 0.9 % IV BOLUS
1000.0000 mL | Freq: Once | INTRAVENOUS | Status: AC
  Administered 2024-07-24: 1000 mL via INTRAVENOUS

## 2024-07-24 MED ORDER — PROCHLORPERAZINE EDISYLATE 10 MG/2ML IJ SOLN
5.0000 mg | Freq: Once | INTRAMUSCULAR | Status: AC
  Administered 2024-07-24: 5 mg via INTRAVENOUS
  Filled 2024-07-24: qty 2

## 2024-07-24 NOTE — Discharge Instructions (Signed)
 As I did suggest cardiology should be arranging a close outpatient follow-up with you.  Return if symptoms worsen.  Follow-up with primary care.

## 2024-07-24 NOTE — ED Triage Notes (Signed)
 Pt. Stated, I was just released from [facility] on Tuesday - heart attack, but late Tuesday night I developed a bad headache and I can't get rid of it and have nothing for it. Ive also today had a tinge of pain in my chest

## 2024-07-24 NOTE — ED Triage Notes (Signed)
 Pt c.o chest pain this morning, pt was recently discharged from [facility] after an NSTEMI

## 2024-07-24 NOTE — ED Provider Notes (Signed)
 [facility] EMERGENCY DEPARTMENT AT [facility] Provider Note   CSN: [id number] Arrival date & time: 07/24/24  1007     Patient presents with: Chest Pain   Sherry Daniels is a 63 y.o. female.   Patient here with migraine type headache but also with some chest pain.  Was just discharged from [facility] where she had cardiac catheterization.  She has recurrent Takotsubo cardiomyopathy.  She denies any weakness numbness tingling.  She is having typical type migraine for her today.  She wants to just make sure everything was okay given that she had a recent heart cath.  She states maybe of some mild chest pain but none currently.  Denies any recent illness otherwise.  She states that she had a heart ultrasound heart catheterization and CT scan of her chest  The history is provided by the patient.       Prior to Admission medications   Medication Sig Start Date End Date Taking? Authorizing Provider  acetaminophen  (TYLENOL ) 500 MG tablet Take 1,500 mg by mouth every 6 (six) hours as needed for mild pain (pain score 1-3). Reported on 05/01/2016    [provider]  albuterol  (PROVENTIL ) (2.5 MG/3ML) 0.083% nebulizer solution Take 3 mLs (2.5 mg total) by nebulization every 6 (six) hours as needed for wheezing or shortness of breath. 11/16/21   Chandra Harlene LABOR, NP  albuterol  (VENTOLIN  HFA) 108 (90 Base) MCG/ACT inhaler USE 2 PUFFS EVERY 4 TO 6 HOURS AS NEEDED 05/24/20   Duanne Butler DASEN, MD  ALPRAZolam  (XANAX ) 0.25 MG tablet Take 1 tablet (0.25 mg total) by mouth 2 (two) times daily as needed for anxiety. 07/04/24   Paseda, Folashade R, FNP  aspirin  81 MG EC tablet 1 tablet po qOD 07/05/12   Tysinger, Alm RAMAN, PA-C  estradiol (ESTRACE) 0.1 MG/GM vaginal cream Place 1 g vaginally 3 (three) times a week. Apply 1 application on Monday, Wednesdays, and Fridays per patient 12/27/21   [provider]  estradiol (VIVELLE-DOT) 0.1 MG/24HR patch Place 1 patch onto the  skin 2 (two) times a week. 11/15/23   [provider]  hydrochlorothiazide  (HYDRODIURIL ) 12.5 MG tablet Take 1 tablet (12.5 mg total) by mouth daily. 05/20/24   Daneen Damien BROCKS, NP  isosorbide mononitrate (IMDUR) 30 MG 24 hr tablet Take 30 mg by mouth daily. 07/22/24 07/22/25  [provider]  losartan  (COZAAR ) 50 MG tablet Take 1 tablet (50 mg total) by mouth daily. 03/17/24   Wonda Sharper, MD  Misc Natural Products (TART CHERRY ADVANCED PO) Take 1 tablet by mouth daily.    [provider]  Multiple Vitamins-Minerals (MULTIVITAMIN WITH MINERALS) tablet Take 1 tablet by mouth daily.    [provider]  nitroGLYCERIN  (NITROSTAT ) 0.3 MG SL tablet Place 0.3 mg under the tongue every 5 (five) minutes as needed for chest pain. 07/22/24 07/22/25  [provider]  Omega-3 1000 MG CAPS Take 1 capsule by mouth daily.    [provider]  promethazine  (PHENERGAN ) 12.5 MG tablet Take 1 tablet (12.5 mg total) by mouth every 8 (eight) hours as needed for nausea or vomiting. 09/30/21   Chandra Harlene LABOR, NP  rosuvastatin  (CRESTOR ) 20 MG tablet TAKE 1 TABLET BY MOUTH EVERY DAY 05/19/24   Paseda, Folashade R, FNP  sertraline  (ZOLOFT ) 25 MG tablet TAKE 1 TABLET (25 MG TOTAL) BY MOUTH DAILY. 06/13/24   Paseda, Folashade R, FNP  SUMAtriptan  (IMITREX ) 50 MG tablet Take 1 tablet (50 mg total) by  mouth 2 (two) times daily as needed for migraine. May repeat in 2 hours if headache persists or recurs. 01/23/24   Reddick, Johnathan B, NP  TURMERIC PO Take 1 tablet by mouth daily at 6 (six) AM.    [provider]  Vitamin D , Ergocalciferol , (DRISDOL ) 1.25 MG (50000 UNIT) CAPS capsule Take 1 capsule (50,000 Units total) by mouth every 7 (seven) days. 02/27/24   Cheryl Waddell HERO, PA-C    Allergies: Aspirin , Codeine, Cymbalta  [duloxetine  hcl], and Simvastatin    Review of Systems  Updated Vital Signs BP 121/75   Pulse 63   Temp 98.2 F (36.8 C) (Oral)   Resp 13   Ht 5' 6  (1.676 m)   Wt 70.3 kg   SpO2 100%   BMI 25.02 kg/m   Physical Exam Vitals and nursing note reviewed.  Constitutional:      General: She is not in acute distress.    Appearance: She is well-developed. She is not ill-appearing.  HENT:     Head: Normocephalic and atraumatic.  Eyes:     Extraocular Movements: Extraocular movements intact.     Conjunctiva/sclera: Conjunctivae normal.     Pupils: Pupils are equal, round, and reactive to light.  Cardiovascular:     Rate and Rhythm: Normal rate and regular rhythm.     Pulses:          Radial pulses are 2+ on the right side and 2+ on the left side.     Heart sounds: No murmur heard. Pulmonary:     Effort: Pulmonary effort is normal. No respiratory distress.     Breath sounds: Normal breath sounds. No decreased breath sounds, wheezing or rhonchi.  Abdominal:     Palpations: Abdomen is soft.     Tenderness: There is no abdominal tenderness.  Musculoskeletal:        General: No swelling.     Cervical back: Normal range of motion and neck supple.  Skin:    General: Skin is warm and dry.     Capillary Refill: Capillary refill takes less than 2 seconds.  Neurological:     General: No focal deficit present.     Mental Status: She is alert and oriented to person, place, and time.     Cranial Nerves: No cranial nerve deficit.     Motor: No weakness.     Comments: 5+5 strength throughout, normal sensation, normal speech, normal coordination  Psychiatric:        Mood and Affect: Mood normal.     (all labs ordered are listed, but only abnormal results are displayed) Labs Reviewed  COMPREHENSIVE METABOLIC PANEL WITH GFR - Abnormal; Notable for the following components:      Result Value   ALT 49 (*)    All other components within normal limits  TROPONIN I (HIGH SENSITIVITY) - Abnormal; Notable for the following components:   Troponin I (High Sensitivity) 58 (*)    All other components within normal limits  TROPONIN I (HIGH  SENSITIVITY) - Abnormal; Notable for the following components:   Troponin I (High Sensitivity) 56 (*)    All other components within normal limits  CBC WITH DIFFERENTIAL/PLATELET  I-STAT CHEM 8, ED    EKG: EKG Interpretation Date/Time:  Thursday July 24 2024 10:12:01 EDT Ventricular Rate:  87 PR Interval:  158 QRS Duration:  68 QT Interval:  362 QTC Calculation: 435 R Axis:   36  Text Interpretation: Normal sinus rhythm Biatrial enlargement When compared with  ECG of 13-May-2024 12:28, PREVIOUS ECG IS PRESENT Confirmed by Ruthe Cornet [phone]) on 07/24/2024 12:27:26 PM  Radiology: CT Head Wo Contrast Result Date: 07/24/2024 CLINICAL DATA:  Headache. EXAM: CT HEAD WITHOUT CONTRAST TECHNIQUE: Contiguous axial images were obtained from the base of the skull through the vertex without intravenous contrast. RADIATION DOSE REDUCTION: This exam was performed according to the departmental dose-optimization program which includes automated exposure control, adjustment of the mA and/or kV according to patient size and/or use of iterative reconstruction technique. COMPARISON:  May 13, 2024 FINDINGS: Brain: No evidence of acute infarction, hemorrhage, hydrocephalus, extra-axial collection or mass lesion/mass effect. Vascular: No hyperdense vessel or unexpected calcification. Skull: Normal. Negative for fracture or focal lesion. Sinuses/Orbits: No acute finding. Other: None. IMPRESSION: No acute intracranial pathology. Electronically Signed   By: Suzen Dials M.D.   On: 07/24/2024 13:38   DG Chest 1 View Result Date: 07/24/2024 CLINICAL DATA:  Chest pain. Recent myocardial infarction. Headache. EXAM: CHEST  1 VIEW COMPARISON:  05/13/2024 FINDINGS: The lungs appear clear. Cardiac and mediastinal contours normal. No blunting of the costophrenic angles. No significant bony findings. IMPRESSION: 1. No active cardiopulmonary disease is radiographically apparent. Electronically Signed   By: Ryan Salvage M.D.   On: 07/24/2024 10:55     Procedures   Medications Ordered in the ED  prochlorperazine  (COMPAZINE ) injection 5 mg (5 mg Intravenous Given 07/24/24 1312)  diphenhydrAMINE  (BENADRYL ) injection 12.5 mg (12.5 mg Intravenous Given 07/24/24 1311)  sodium chloride  0.9 % bolus 1,000 mL (1,000 mLs Intravenous New Bag/Given 07/24/24 1308)  magnesium  sulfate IVPB 2 g 50 mL (0 g Intravenous Stopped 07/24/24 1408)                                    Medical Decision Making Amount and/or Complexity of Data Reviewed Labs: ordered. Radiology: ordered.  Risk Prescription drug management.   Afnan Cadiente is here with migraine headache, chest pain.  History of migraines history of stress-induced cardiomyopathy.  She was actually just discharged from [facility] where she had a heart cath echocardiogram.  She had normal coronaries little bit of decreased EF at 30 to 35% but echocardiogram the next day showed normal/improved EF.  She has a history of the same, heart catheterization and cardiac workup back in May.  She is having typical migraine today.  Is not having much discomfort over her chest currently but she felt unwell.  She attributes migraine likely to poor sleep while being in the hospital.  EKG shows sinus rhythm.  No ischemic changes.  I reviewed her hospital stay at [facility].  She had a negative PE scan normal heart cath except for decreased EF.  Looks like she was discharged with mdur to go along with her other blood pressure medications.  Overall neurologically she appears to be intact.  She has normal strength sensation and speech.  I do think that the headaches likely migraine related but will get a head CT to rule out any intracranial process.  Have low suspicion for stroke.  Will check repeat troponin chest x-ray basic labs give headache cocktail and reevaluate.  Overall head CT was unremarkable.  She is feeling much better after headache cocktail.  Basic labs were unremarkable for  my review and interpretation of labs.  Troponin 58 and 56.  I suspect that this is downtrending from her positive troponins a couple days ago at [facility].  Chest  x-ray also unremarkable.  I talked with Dr. Shlomo with cardiology who will arrange for close follow-up with cardiology team.  1 we reviewed her heart caths that had EF of 30% but next day echocardiogram showed EF of 50 to 55%.  We suspect maybe she has been having some coronary vasospasm.  But overall patient will continue medications as already prescribed.  She will follow-up with cardiology.  She understands return precautions.  Follow-up with primary care.  Discharged in good condition.  This chart was dictated using voice recognition software.  Despite best efforts to proofread,  errors can occur which can change the documentation meaning.      Final diagnoses:  Nonspecific chest pain  Other migraine without status migrainosus, not intractable    ED Discharge Orders     None          Ruthe Cornet, DO 07/24/24 1515

## 2024-07-24 NOTE — Telephone Encounter (Signed)
 FYI Only or Action Required?: FYI only for provider.  Patient was last seen in primary care on 05/22/2024 by Oley Bascom RAMAN, NP.  Called Nurse Triage reporting Migraine.  Symptoms began several days ago.  Interventions attempted: Nothing.  Symptoms are: gradually worsening.  Triage Disposition: See HCP Within 4 Hours (Or PCP Triage)  Patient/caregiver understands and will follow disposition?: Yes      Copied from CRM [id number]. Topic: Clinical - Red Word Triage >> Jul 24, 2024  9:34 AM Rosaria BRAVO wrote: Red Word that prompted transfer to Nurse Triage: Suffering from severe migraine pain, has heart related incident, concerned about drug interaction. Reason for Disposition  [1] SEVERE headache (e.g., excruciating) AND [2] not improved after 2 hours of pain medicine  Answer Assessment - Initial Assessment Questions 1. LOCATION: Where does it hurt?      Migraine since yesterday, across the front, states cluster headache with nasal congestion 2. ONSET: When did the headache start? (e.g., minutes, hours, days)      Tuesday evening 3. PATTERN: Does the pain come and go, or has it been constant since it started?     constant 4. SEVERITY: How bad is the pain? and What does it keep you from doing?  (e.g., Scale 1-10; mild, moderate, or severe)     severe 5. RECURRENT SYMPTOM: Have you ever had headaches before? If Yes, ask: When was the last time? and What happened that time?      migraines 6. CAUSE: What do you think is causing the headache?     migraines 7. MIGRAINE: Have you been diagnosed with migraine headaches? If Yes, ask: Is this headache similar?      yes 8. HEAD INJURY: Has there been any recent injury to your head?      Hx stress induced heart attack on Monday and this is the second one in 4 months. Denies injury 9. OTHER SYMPTOMS: Do you have any other symptoms? (e.g., fever, stiff neck, eye pain, sore throat, cold symptoms)     Congestion,  sinus pain 10. PREGNANCY: Is there any chance you are pregnant? When was your last menstrual period?       na  Protocols used: Headache-A-AH

## 2024-07-24 NOTE — ED Provider Triage Note (Signed)
 Emergency Medicine Provider Triage Evaluation Note  Sherry Daniels , a 63 y.o. female  was evaluated in triage.  Pt complains of headache, chest pain.  Feels typical of her migraines.  Just had a recent heart cath.  Wanted to make sure that everything was okay.  Review of Systems  Positive: Headache chest pain Negative: Shortness of breath weakness numbness tingling vision loss speech changes  Physical Exam  BP (!) 160/97 (BP Location: Left Arm)   Pulse 96   Temp 97.7 F (36.5 C)   Resp 18   Ht 5' 6 (1.676 m)   Wt 70.3 kg   SpO2 100%   BMI 25.02 kg/m  Gen:   Awake, no distress   Resp:  Normal effort  MSK:   Moves extremities without difficulty  Other:  Normal cranial nerves normal speech  Medical Decision Making  Medically screening exam initiated at 10:37 AM.  Appropriate orders placed.  Sherry Daniels was informed that the remainder of the evaluation will be completed by another provider, this initial triage assessment does not replace that evaluation, and the importance of remaining in the ED until their evaluation is complete.  Diagnosis Takotsubo cardiomyopathy recently, heart cath unremarkable PE scan unremarkable couple days ago.  Seems like most of her issues today are migraine related.  Does not feel like the worst headache of her life.  Feels like her typical migraines and they are bad but she little bit nervous that she has had some chest discomfort as well.   Ruthe Cornet, DO 07/24/24 1038

## 2024-07-24 NOTE — Transitions of Care (Post Inpatient/ED Visit) (Signed)
   07/24/2024  Name: Lincy Belles MRN: [id number] DOB: July 03, 1961  Today's TOC FU Call Status: Today's TOC FU Call Status:: Unsuccessful Call (1st Attempt)  Attempted to reach the patient regarding the most recent Inpatient/ED visit.  Follow Up Plan: Additional outreach attempts will be made to reach the patient to complete the Transitions of Care (Post Inpatient/ED visit) call.   Signature Suzen Shove   CMA II

## 2024-07-25 ENCOUNTER — Telehealth: Payer: Self-pay

## 2024-07-25 ENCOUNTER — Encounter: Payer: Self-pay | Admitting: Cardiovascular Disease

## 2024-07-25 ENCOUNTER — Ambulatory Visit (HOSPITAL_COMMUNITY)

## 2024-07-25 NOTE — Telephone Encounter (Signed)
 Patient calling and wanting to speak to nurse about her the message sent

## 2024-07-25 NOTE — Telephone Encounter (Signed)
 Spoke with patient, she states she has been having severe headaches since starting Imdur 30 mg earlier this week.  Patient is aware headache can occur with this medication and can take a few days to improve.   She went to ED last night due to headache and received IV fluids and Benadryl , this helped some.  She states she has been taking OTC Tylenol  but this does not touch the headache.  She has not taken her losartan  this morning and plans on taking in the evenings and taking hydrochlorothiazide  and Imdur in the mornings. She states headache is slightly better not taking losartan  this morning. She is staying hydrated, but would like to know what else can she take or do differently to help with these headaches.   Will forward to Dr. Barbaraann and Pharm D to review and advise.

## 2024-07-25 NOTE — Transitions of Care (Post Inpatient/ED Visit) (Signed)
 07/25/2024  Name: Sherry Daniels MRN: [id number] DOB: 1961/07/29  Today's TOC FU Call Status: Today's TOC FU Call Status:: Successful TOC FU Call Completed TOC FU Call Complete Date: 07/25/24 Patient's Name and Date of Birth confirmed.  Transition Care Management Follow-up Telephone Call Date of Discharge: 07/24/24 Discharge Facility: [facility]) Type of Discharge: Emergency Department Reason for ED Visit: Cardiac Conditions, Other: (Migraine) Cardiac Conditions Diagnosis: Chest Pain Persisting How have you been since you were released from the hospital?: Same Any questions or concerns?: Yes Patient Questions/Concerns:: (S) Patient is asking if there is anything that can be prescribed for headache; tylenol  is ineffective Patient Questions/Concerns Addressed: Notified Provider of Patient Questions/Concerns  Items Reviewed: Did you receive and understand the discharge instructions provided?: Yes Medications obtained,verified, and reconciled?: Yes (Medications Reviewed) Any new allergies since your discharge?: No Dietary orders reviewed?: NA  Medications Reviewed Today: Medications Reviewed Today     Reviewed by [name], LPN (Licensed Practical Nurse) on 07/25/24 at 1000  Med List Status: <None>   Medication Order Taking? Sig Documenting Provider Last Dose Status Informant  acetaminophen  (TYLENOL ) 500 MG tablet [id number] Yes Take 1,500 mg by mouth every 6 (six) hours as needed for mild pain (pain score 1-3). Reported on 05/01/2016 [provider]  Active Self  albuterol  (PROVENTIL ) (2.5 MG/3ML) 0.083% nebulizer solution [id number] Yes Take 3 mLs (2.5 mg total) by nebulization every 6 (six) hours as needed for wheezing or shortness of breath. Chandra Harlene LABOR, NP  Active Self  albuterol  (VENTOLIN  HFA) 108 (90 Base) MCG/ACT inhaler [id number] Yes USE 2 PUFFS EVERY 4 TO 6 HOURS AS NEEDED Duanne Butler DASEN, MD  Active Self  ALPRAZolam  (XANAX ) 0.25 MG  tablet [id number] Yes Take 1 tablet (0.25 mg total) by mouth 2 (two) times daily as needed for anxiety. Paseda, Folashade R, FNP  Active   aspirin  81 MG EC tablet [id number] Yes 1 tablet po qOD Bulah Alm RAMAN, PA-C  Active Self  estradiol (ESTRACE) 0.1 MG/GM vaginal cream [id number] Yes Place 1 g vaginally 3 (three) times a week. Apply 1 application on Monday, Wednesdays, and Fridays per patient [provider]  Active Self  estradiol (VIVELLE-DOT) 0.1 MG/24HR patch [id number] Yes Place 1 patch onto the skin 2 (two) times a week. [provider]  Active Self           Med Note (SATTERFIELD, DARIUS E   Tue May 13, 2024  3:52 PM) No specific days   hydrochlorothiazide  (HYDRODIURIL ) 12.5 MG tablet [id number] Yes Take 1 tablet (12.5 mg total) by mouth daily. Daneen Damien BROCKS, NP  Active   isosorbide mononitrate (IMDUR) 30 MG 24 hr tablet [id number] Yes Take 30 mg by mouth daily. [provider]  Active   losartan  (COZAAR ) 50 MG tablet [id number] Yes Take 1 tablet (50 mg total) by mouth daily. Wonda Sharper, MD  Active Self  Misc Natural Products [facility] ADVANCED PO) [id number] Yes Take 1 tablet by mouth daily. [provider]  Active Self  Multiple Vitamins-Minerals (MULTIVITAMIN WITH MINERALS) tablet [id number] Yes Take 1 tablet by mouth daily. [provider]  Active Self  nitroGLYCERIN  (NITROSTAT ) 0.3 MG SL tablet [id number] Yes Place 0.3 mg under the tongue every 5 (five) minutes as needed for chest pain. [provider]  Active   Omega-3 1000 MG CAPS [id number] Yes Take 1 capsule by mouth daily. [provider]  Active Self  promethazine  (PHENERGAN ) 12.5 MG tablet [id number] Yes Take 1  tablet (12.5 mg total) by mouth every 8 (eight) hours as needed for nausea or vomiting. Chandra Harlene LABOR, NP  Active Self           Med Note ALVIA, MARISSA C   Tue Jan 08, 2024  8:08 AM)    rosuvastatin  (CRESTOR ) 20 MG tablet [id number] Yes TAKE 1 TABLET BY  MOUTH EVERY DAY Paseda, Folashade R, FNP  Active   sertraline  (ZOLOFT ) 25 MG tablet [id number] Yes TAKE 1 TABLET (25 MG TOTAL) BY MOUTH DAILY. Paseda, Folashade R, FNP  Active   SUMAtriptan  (IMITREX ) 50 MG tablet [id number] Yes Take 1 tablet (50 mg total) by mouth 2 (two) times daily as needed for migraine. May repeat in 2 hours if headache persists or recurs. Aurea Goodell B, NP  Active Self  TURMERIC PO [id number] Yes Take 1 tablet by mouth daily at 6 (six) AM. [provider]  Active Self  Vitamin D , Ergocalciferol , (DRISDOL ) 1.25 MG (50000 UNIT) CAPS capsule [id number] Yes Take 1 capsule (50,000 Units total) by mouth every 7 (seven) days. Cheryl Waddell HERO, PA-C  Active Self           Med Note (SATTERFIELD, TEENA BRAVO   Fri Mar 14, 2024 10:01 PM) Take on Wednesdays             Home Care and Equipment/Supplies: Were Home Health Services Ordered?: NA Any new equipment or medical supplies ordered?: NA  Functional Questionnaire: Do you need assistance with bathing/showering or dressing?: No Do you need assistance with meal preparation?: No Do you need assistance with eating?: No Do you have difficulty maintaining continence: No Do you need assistance with getting out of bed/getting out of a chair/moving?: No Do you have difficulty managing or taking your medications?: No  Follow up appointments reviewed: PCP Follow-up appointment confirmed?: Yes Date of PCP follow-up appointment?: 07/28/24 Follow-up Provider: Folashade Paseda FNP Specialist Hospital Follow-up appointment confirmed?: Yes Date of Specialist follow-up appointment?: 08/05/24 Follow-Up Specialty Provider:: Cardiology Do you need transportation to your follow-up appointment?: No Do you understand care options if your condition(s) worsen?: Yes-patient verbalized understanding    SIGNATURE .cbs

## 2024-07-28 ENCOUNTER — Ambulatory Visit: Payer: Self-pay | Admitting: Nurse Practitioner

## 2024-07-29 NOTE — Progress Notes (Unsigned)
 Cardiology Office Note    Date:  08/05/2024  ID:  Sherry Daniels, Sherry Daniels 10-Jul-1961, MRN [id number] PCP:  Paseda, Folashade R, FNP  Cardiologist:  Darryle ONEIDA Decent, MD  Electrophysiologist:  None   Chief Complaint: Follow up for Takotsubo CM   History of Present Illness: Sherry Daniels   Sherry Daniels is a 63 y.o. female with visit-pertinent history of nonobstructive CAD, NICM (Takotsubo CM), mitral valve regurgitation, bradycardia, hypertension, hyperlipidemia, fibromyalgia and GERD.  Patient was initially referred to cardiology in 2022 in setting of chest tightness and palpitations.  Coronary CTA in 09/2021 revealed coronary calcium  score of 0.  7-day ZIO monitor in 09/2021 revealed predominantly normal sinus rhythm, sinus bradycardia, sinus tachycardia, rare PACs and PVCs.  Patient was hospitalized in May 2025 in setting of NSTEMI.  Cardiac catheterization revealed minimal nonobstructive CAD, severe segmental LV dysfunction with periapical ballooning, hyperdynamic basal segments, LVEF estimated 35% findings consistent with a acute Takotsubo syndrome.  Patient was started on metoprolol .  Echocardiogram in 03/2024 showed EF 40 to 45%, mildly decreased LV function, RWMA, normal RV systolic function, mild mitral valve regurgitation.  Patient was seen in office on 04/01/2024 and was stable from a cardiac standpoint.  Metoprolol  was reduced to 12.5 mg twice daily in setting of symptomatic bradycardia and ultimately discontinued.  Repeat echocardiogram was recommended in 3 months.  On 05/13/2024 patient presented the ED in setting of chest pain, diaphoresis, shortness of breath.  She also reported left arm numbness, left facial numbness.  MRI, CT of the head were negative for acute findings.  Troponin was negative x 2 and patient was discharged home.  Patient was seen in clinic on 05/14/2024 for follow-up.  Patient reported some mild dyspnea on exertion, intermittent chest discomfort and getting winded  having long conversations and generally felt fatigued.  She also reported nocturnal leg cramps.  She did report some improvement following discontinuation metoprolol  however have symptoms have since worsened.  She questioned if her ED visit the prior day was related to an anxiety attack.  Echocardiogram on 06/12/2024 indicated LVEF 60 to 65%, no wall motion abnormalities, RV systolic function and size was normal, normal PASP, mild mitral valve regurgitation with no evidence of stenosis.  On chart review on 07/21/2024 patient presented to [facility] with increased chest pain.  Her initial evaluation revealed elevated troponin and D-dimer levels, hsTroponin 37>>370>>407>>1563>>921.  CTA was negative for pulmonary embolism.  She underwent LHC on 07/21/2024 which demonstrated a LV end-diastolic pressure of 16 mmHg, redo systolic function with anterior apical hypokinesis and no significant obstructive coronary disease.  EF was estimated at 30 to 35%, consistent with stress cardiomyopathy.  Echocardiogram on 07/22/2024 indicated LVEF greater than 55% she is admitted for telemetry monitoring, serial troponin measurements and supportive care.  Patient's losartan  and hydrochlorothiazide  was continued at discharge.  Patient was discharged on Imdur 30 mg daily.  Following patient notified the office that she had increases in her migraines on increased dose of Imdur, her Imdur was decreased to 15 mg daily.  Today she presents for follow-up.  She reports that she is now overall doing well.  She reports she is tolerating Imdur 15 mg daily well, reports resolution of previously noted migraines.  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  During conflict reports she suddenly became overwhelmed  with chest pain and became increasingly dizzy and nauseous with  increased shortness of breath.  Her blood pressure was checked by the school nurse and was noted to be significantly increased, over 200/100.  EMS gave her nitroglycerin  spray with some improvement in symptoms.  Patient reports at [facility] they questioned if she had had a coronary vasospasm as cardiac catheterization demonstrated LV end-diastolic pressure of 16 mmHg, reduced systolic function with an anterior apical hypokinesis and no significant obstructive coronary artery disease.  Patient reports she is planning to follow-up with cardiologist at [facility] who she reports is a specialist regarding coronary vasospasm.  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  She does note some occasional chest discomfort and dizziness and lightheadedness which she feels is related to ongoing stress and high anxiety, she feels she has a certain element of PTSD and is hyperaware of different sensations.  She reports she is now tolerating Imdur well.  ROS: .   Today she denies lower extremity edema, fatigue, palpitations, melena, hematuria, hemoptysis, diaphoresis, weakness, presyncope, syncope, orthopnea, and PND.  All other systems are reviewed and otherwise negative. Studies Reviewed: Sherry Daniels    EKG:  EKG is ordered today, personally reviewed, demonstrating  EKG Interpretation Date/Time:  Tuesday August 05 2024 11:03:16 EDT Ventricular Rate:  62 PR Interval:  168 QRS Duration:  78 QT Interval:  428 QTC Calculation: 434 R Axis:   3  Text Interpretation: Normal sinus rhythm Nonspecific T wave abnormality Confirmed by Francys Bolin [phone]) on 08/05/2024 6:21:30 PM   CV Studies: Cardiac studies reviewed are outlined and summarized above. Otherwise please see EMR for full report. Cardiac Studies & Procedures   ______________________________________________________________________________________________ CARDIAC CATHETERIZATION  CARDIAC CATHETERIZATION 03/14/2024  Conclusion 1.   Minimal nonobstructive coronary artery disease with no significant stenoses 2.  Severe segmental LV dysfunction with periapical ballooning, hyperdynamic basal segments, LVEF estimated at 35%, findings consistent with acute Takotsubo syndrome  Recommendations: Will adjust her antihypertensive medications with discontinuation of amlodipine  and we will start her on metoprolol  succinate (beta-1 selective agent in the setting of asthma), and losartan .  Check 2D echo.  Counseled patient and family on findings, prognosis, and rationale for medical therapy.  Anticipate 24 to 48-hour hospitalization depending on her symptoms.  Findings Coronary Findings Diagnostic  Dominance: Right  Left Main Vessel is angiographically normal.  Left Anterior Descending The vessel exhibits minimal luminal irregularities. There is mild plaquing in the mid vessel with no significant stenosis.  The remaining portions of the LAD and its diagonal branches are widely patent.  Left Circumflex Vessel is angiographically normal.  Right Coronary Artery Vessel is large. Vessel is angiographically normal. Large, dominant vessel.  Angiographically normal.  The PDA and PLA branches are widely patent.  Intervention  No interventions have been documented.     ECHOCARDIOGRAM  ECHOCARDIOGRAM COMPLETE 06/12/2024  Narrative ECHOCARDIOGRAM REPORT    Patient Name:   Sherry Daniels Date of Exam: 06/12/2024 Medical Rec #:  [id number]               Height:       66.0 in Accession #:    [id number]              Weight:       170.0 lb Date of Birth:  12/25/1960               BSA:          1.866  m Patient Age:    62 years                BP:           137/94 mmHg Patient Gender: F                       HR:           64 bpm. Exam Location:  [address]  Procedure: 2D Echo, Cardiac Doppler, Color Doppler and Strain Analysis (Both Spectral and Color Flow Doppler were utilized during procedure).  Indications:     Nonischemic Cardiomyopathy I42.8  History:        Patient has prior history of Echocardiogram examinations. Nonischemic Cardiomyopathy, Signs/Symptoms:Chest Pain; Risk Factors:Hypertension and Hyperlipidemia.  Sonographer:    Sharlet Hamilton RDCS Referring Phys: [phone] EMILY C MONGE  IMPRESSIONS   1. Left ventricular ejection fraction, by estimation, is 60 to 65%. The left ventricle has normal function. The left ventricle has no regional wall motion abnormalities. The average left ventricular global longitudinal strain is -19.4 %. The global longitudinal strain is normal. 2. Right ventricular systolic function is normal. The right ventricular size is normal. There is normal pulmonary artery systolic pressure. The estimated right ventricular systolic pressure is 16.5 mmHg. 3. The mitral valve is normal in structure. Mild mitral valve regurgitation. No evidence of mitral stenosis. 4. The aortic valve is normal in structure. Aortic valve regurgitation is not visualized. No aortic stenosis is present. 5. The inferior vena cava is normal in size with greater than 50% respiratory variability, suggesting right atrial pressure of 3 mmHg.  Comparison(s): A prior study was performed on 03/15/2024. LVEF improved from 40-45% to 60-65%.  FINDINGS Left Ventricle: Left ventricular ejection fraction, by estimation, is 60 to 65%. The left ventricle has normal function. The left ventricle has no regional wall motion abnormalities. The average left ventricular global longitudinal strain is -19.4 %. Strain was performed and the global longitudinal strain is normal. The left ventricular internal cavity size was normal in size. There is borderline left ventricular hypertrophy.  Right Ventricle: The right ventricular size is normal. No increase in right ventricular wall thickness. Right ventricular systolic function is normal. There is normal pulmonary artery systolic pressure. The tricuspid regurgitant velocity is  1.84 m/s, and with an assumed right atrial pressure of 3 mmHg, the estimated right ventricular systolic pressure is 16.5 mmHg.  Left Atrium: Left atrial size was normal in size.  Right Atrium: Right atrial size was normal in size.  Pericardium: There is no evidence of pericardial effusion.  Mitral Valve: The mitral valve is normal in structure. Mild mitral valve regurgitation. No evidence of mitral valve stenosis.  Tricuspid Valve: The tricuspid valve is normal in structure. Tricuspid valve regurgitation is trivial. No evidence of tricuspid stenosis.  Aortic Valve: The aortic valve is normal in structure. Aortic valve regurgitation is not visualized. No aortic stenosis is present. Aortic valve mean gradient measures 3.0 mmHg. Aortic valve peak gradient measures 5.7 mmHg. Aortic valve area, by VTI measures 2.03 cm.  Pulmonic Valve: The pulmonic valve was normal in structure. Pulmonic valve regurgitation is mild. No evidence of pulmonic stenosis.  Aorta: The aortic root is normal in size and structure.  Venous: The inferior vena cava is normal in size with greater than 50% respiratory variability, suggesting right atrial pressure of 3 mmHg.  IAS/Shunts: The atrial septum is grossly normal.   LEFT VENTRICLE PLAX 2D LVIDd:  3.90 cm   Diastology LVIDs:         2.70 cm   LV e' medial:    6.85 cm/s LV PW:         1.10 cm   LV E/e' medial:  8.3 LV IVS:        1.10 cm   LV e' lateral:   10.00 cm/s LVOT diam:     1.80 cm   LV E/e' lateral: 5.7 LV SV:         55 LV SV Index:   29        2D Longitudinal Strain LVOT Area:     2.54 cm  2D Strain GLS (A4C):   -22.5 % 2D Strain GLS (A3C):   -17.9 % 2D Strain GLS (A2C):   -17.8 % 2D Strain GLS Avg:     -19.4 %  RIGHT VENTRICLE             IVC RV Basal diam:  3.20 cm     IVC diam: 1.40 cm RV Mid diam:    2.70 cm RV S prime:     16.40 cm/s TAPSE (M-mode): 2.4 cm RVSP:           16.5 mmHg  LEFT ATRIUM             Index        RIGHT  ATRIUM           Index LA Vol (A2C):   32.5 ml 17.41 ml/m  RA Pressure: 3.00 mmHg LA Vol (A4C):   35.1 ml 18.81 ml/m  RA Area:     15.50 cm LA Biplane Vol: 34.0 ml 18.22 ml/m  RA Volume:   39.80 ml  21.33 ml/m AORTIC VALVE                    PULMONIC VALVE AV Area (Vmax):    2.31 cm     PR End Diast Vel: 3.44 msec AV Area (Vmean):   2.15 cm AV Area (VTI):     2.03 cm AV Vmax:           119.00 cm/s AV Vmean:          78.900 cm/s AV VTI:            0.271 m AV Peak Grad:      5.7 mmHg AV Mean Grad:      3.0 mmHg LVOT Vmax:         108.00 cm/s LVOT Vmean:        66.800 cm/s LVOT VTI:          0.216 m LVOT/AV VTI ratio: 0.80  AORTA Ao Root diam: 2.80 cm Ao Asc diam:  3.20 cm  MITRAL VALVE               TRICUSPID VALVE MV Area (PHT): 3.37 cm    TR Peak grad:   13.5 mmHg MV Decel Time: 225 msec    TR Vmax:        184.00 cm/s MV E velocity: 57.10 cm/s  Estimated RAP:  3.00 mmHg MV A velocity: 72.20 cm/s  RVSP:           16.5 mmHg MV E/A ratio:  0.79 SHUNTS Systemic VTI:  0.22 m Systemic Diam: 1.80 cm  Sunit Tolia Electronically signed by Madonna Large Signature Date/Time: 06/12/2024/4:30:02 PM    Final    MONITORS  LONG TERM MONITOR (3-14 DAYS) 09/27/2021  Narrative Enrollment 09/09/2021-09/14/2021 (  5 days 11 hours). Patient had a min HR of 52 bpm (sinus bradycardia), max HR of 152 bpm (sinus tachycardia), and avg HR of 74 bpm (normal sinus rhythm). Predominant underlying rhythm was Sinus Rhythm. Isolated SVEs were rare (<1.0%), and no SVE Couplets or SVE Triplets were present. Isolated VEs were rare (<1.0%), VE Couplets were rare (<1.0%), and no VE Triplets were present.  09/09/21 08:40pm Light headed,Light sweating coincided with normal sinus rhythm 68 bpm. 09/10/21 02:25am Chest pain or pressure,Dizziness,Light headed coincided with normal sinus rhythm 68 bpm. 09/10/21 05:05am Fluttering or racing,Light headed,Sweating coincided with normal sinus rhythm 66  bpm. 09/10/21 11:30am Anxious,Chest pain or pressure,Light headed,Mild chest pressure coincided with normal sinus rhythm 72 bpm. 09/11/21 09:07pm Short of breath coincided with normal sinus rhythm 76 bpm.  Impression: 1. No arrhythmias detected. 2. Rare ectopy.  Darryle T. Barbaraann, MD, [facility]  [facility] [address] [place], [place] [address] [phone] 7:25 PM   CT SCANS  CT CORONARY MORPH W/CTA COR W/SCORE 09/16/2021  Addendum 09/16/2021 11:10 AM ADDENDUM REPORT: 09/16/2021 11:07  CLINICAL DATA:  Chest pain  EXAM: Cardiac CTA  MEDICATIONS: Sub lingual nitro. 4 mg and lopressor  100mg   TECHNIQUE: The patient was scanned on a [employer] 192 scanner. Gantry rotation speed was 250 msecs. Collimation was. 6 mm . A 120 kV prospective scan was triggered in the ascending thoracic aorta at 140 HU's with full mA between 30-70% of the R-R interval . Average HR during the scan was 47 bpm. The 3D data set was interpreted on a dedicated work station using MPR, MIP and VRT modes. A total of 80 cc of contrast was used.  FINDINGS: Non-cardiac: See separate report from [facility]. No significant findings on limited lung and soft tissue windows.  Calcium  score: No calcium  noted  Coronary Arteries: Right dominant with no anomalies  LM: Normal  LAD: Normal  D1: Normal  D2: Normal  Circumflex: Normal  OM1: Normal  OM2: Normal  RCA: Normal  PDA: Normal  PLA: Normal  IMPRESSION: 1.  Calcium  score 0  2.  Normal ascending thoracic aorta 3.1 cm  3.  Normal right dominant coronary arteries  Maude Emmer   Electronically Signed By: Maude Emmer M.D. On: 09/16/2021 11:07  Narrative EXAM: OVER-READ INTERPRETATION  CT CHEST  The following report is an over-read performed by radiologist Dr. Toribio Aye of [facility] on 09/16/2021. This over-read does not include interpretation of cardiac or  coronary anatomy or pathology. The coronary calcium  score/coronary CTA interpretation by the cardiologist is attached.  COMPARISON:  None.  FINDINGS: Within the visualized portions of the thorax there are no suspicious appearing pulmonary nodules or masses, there is no acute consolidative airspace disease, no pleural effusions, no pneumothorax and no lymphadenopathy. Visualized portions of the upper abdomen are unremarkable. There are no aggressive appearing lytic or blastic lesions noted in the visualized portions of the skeleton.  IMPRESSION: 1. No significant incidental noncardiac findings are noted.  Electronically Signed: By: Toribio Aye M.D. On: 09/16/2021 10:11     ______________________________________________________________________________________________       Current Reported Medications:.    Current Meds  Medication Sig   acetaminophen  (TYLENOL ) 500 MG tablet Take 1,500 mg by mouth every 6 (six) hours as needed for mild pain (pain score 1-3). Reported on 05/01/2016   albuterol  (PROVENTIL ) (2.5 MG/3ML) 0.083% nebulizer solution Take 3 mLs (2.5 mg total) by nebulization every 6 (six) hours as needed for  wheezing or shortness of breath.   albuterol  (VENTOLIN  HFA) 108 (90 Base) MCG/ACT inhaler USE 2 PUFFS EVERY 4 TO 6 HOURS AS NEEDED   ALPRAZolam  (XANAX ) 0.25 MG tablet Take 1 tablet (0.25 mg total) by mouth 2 (two) times daily as needed for anxiety.   aspirin  81 MG EC tablet 1 tablet po qOD   estradiol (ESTRACE) 0.1 MG/GM vaginal cream Place 1 g vaginally 3 (three) times a week. Apply 1 application on Monday, Wednesdays, and Fridays per patient   estradiol (VIVELLE-DOT) 0.1 MG/24HR patch Place 1 patch onto the skin 2 (two) times a week.   hydrochlorothiazide  (HYDRODIURIL ) 12.5 MG tablet Take 1 tablet (12.5 mg total) by mouth daily.   ISOSORBIDE MONONITRATE ER PO Take 15 mg by mouth daily.   losartan  (COZAAR ) 50 MG tablet Take 1 tablet (50 mg total) by mouth daily.    Misc Natural Products (TART CHERRY ADVANCED PO) Take 1 tablet by mouth daily.   Multiple Vitamins-Minerals (MULTIVITAMIN WITH MINERALS) tablet Take 1 tablet by mouth daily.   nitroGLYCERIN  (NITROSTAT ) 0.3 MG SL tablet Place 0.3 mg under the tongue every 5 (five) minutes as needed for chest pain.   Omega-3 1000 MG CAPS Take 1 capsule by mouth daily.   promethazine  (PHENERGAN ) 12.5 MG tablet Take 1 tablet (12.5 mg total) by mouth every 8 (eight) hours as needed for nausea or vomiting.   rosuvastatin  (CRESTOR ) 20 MG tablet TAKE 1 TABLET BY MOUTH EVERY DAY   sertraline  (ZOLOFT ) 25 MG tablet TAKE 1 TABLET (25 MG TOTAL) BY MOUTH DAILY.   SUMAtriptan  (IMITREX ) 50 MG tablet Take 1 tablet (50 mg total) by mouth 2 (two) times daily as needed for migraine. May repeat in 2 hours if headache persists or recurs.   TURMERIC PO Take 1 tablet by mouth daily at 6 (six) AM.   Vitamin D , Ergocalciferol , (DRISDOL ) 1.25 MG (50000 UNIT) CAPS capsule Take 1 capsule (50,000 Units total) by mouth every 7 (seven) days.    Physical Exam:    VS:  BP 110/72 (BP Location: Left Arm, Patient Position: Sitting, Cuff Size: Large)   Pulse 63   Ht 5' 6 (1.676 m)   Wt 163 lb (73.9 kg)   SpO2 98%   BMI 26.31 kg/m    Wt Readings from Last 3 Encounters:  08/05/24 163 lb (73.9 kg)  07/24/24 155 lb (70.3 kg)  05/22/24 170 lb (77.1 kg)    GEN: Well nourished, well developed in no acute distress NECK: No JVD; No carotid bruits CARDIAC: RRR, no murmurs, rubs, gallops RESPIRATORY:  Clear to auscultation without rales, wheezing or rhonchi  ABDOMEN: Soft, non-tender, non-distended EXTREMITIES:  No edema; No acute deformity     Asessement and Plan:.    NICM/Takotsubo cardiomyopathy/nonobstructive CAD: Patient hospitalized in 03/2024 in setting of NSTEMI.  Cardiac catheterization revealed minimal nonobstructive CAD, severe segmental LV dysfunction with periapical ballooning, hyperdynamic basal segments, LVEF estimated 35%,  findings consistent with acute Takotsubo syndrome.  Echo in 03/2024 showed EF 40 to 45%, mildly decreased LV function, RWMA, normal RV systolic function, mild mitral valve regurgitation. Echocardiogram on 06/12/2024 indicated LVEF 60 to 65%, no wall motion abnormalities, RV systolic function and size was normal, normal PASP, mild mitral valve regurgitation with no evidence of stenosis. On chart review patient was admitted to [facility] earlier this month with NSTEMI versus Takotsubo cardiomyopathy, cardiac catheterization showed no obstructive CAD, patient with anterior apical hypokinesis consistent with stress cardiomyopathy.  Echocardiogram the following day indicated LVEF 60  to 65% with no wall motion abnormalities. Per patient there was also questions regarding coronary vasospasm however this is not noted that I am able to review.  Patient notes that prior to event she was dealing with a an extremely stressful situation. She plans to follow-up with cardiologist at [facility] as well who she reports is a specialist in vasospasm.  Today she reports some improvement, has not had recurring significant chest pain leading to ED evaluation, notes some occasional discomfort which she feels like is likely related to increased anxiety.  Stressed importance of following with PCP and order for stress management.  Reviewed ED precautions.  Continue Imdur 15 mg daily, patient has previously been unable to tolerate beta-blocker in setting of bradycardia and fatigue. Addendum: Following appointment reviewed case with Dr. Kate, he agreed with recommendation for further evaluation with cardiac MRI.  Attempted to call patient regarding recommendations and was unable to reach patient or leave a voicemail.  Mitral valve regurgitation: Mild on echocardiogram at [facility].  She appears euvolemic and well compensated on exam.  Bradycardia: Patient no longer on beta-blocker therapy in setting of fatigue.  Heart rate today 63 bpm.   Hypertension:  Blood pressure today 110/72. Continue current antihypertensive regimen.    Disposition: F/u with Dr. Barbaraann or Shandon Burlingame, NP in 2-3 months or sooner if needed.   Signed, Dawnn Nam D Nelton Amsden, NP

## 2024-07-30 ENCOUNTER — Ambulatory Visit (HOSPITAL_COMMUNITY)

## 2024-08-01 ENCOUNTER — Ambulatory Visit (HOSPITAL_COMMUNITY)

## 2024-08-04 ENCOUNTER — Ambulatory Visit (HOSPITAL_COMMUNITY)

## 2024-08-05 ENCOUNTER — Ambulatory Visit: Attending: Cardiology | Admitting: Cardiology

## 2024-08-05 ENCOUNTER — Telehealth: Payer: Self-pay | Admitting: Cardiology

## 2024-08-05 ENCOUNTER — Encounter: Payer: Self-pay | Admitting: Cardiology

## 2024-08-05 VITALS — BP 110/72 | HR 63 | Ht 66.0 in | Wt 163.0 lb

## 2024-08-05 DIAGNOSIS — I428 Other cardiomyopathies: Secondary | ICD-10-CM

## 2024-08-05 DIAGNOSIS — I2510 Atherosclerotic heart disease of native coronary artery without angina pectoris: Secondary | ICD-10-CM | POA: Diagnosis not present

## 2024-08-05 DIAGNOSIS — I5020 Unspecified systolic (congestive) heart failure: Secondary | ICD-10-CM

## 2024-08-05 DIAGNOSIS — I340 Nonrheumatic mitral (valve) insufficiency: Secondary | ICD-10-CM | POA: Diagnosis not present

## 2024-08-05 DIAGNOSIS — E785 Hyperlipidemia, unspecified: Secondary | ICD-10-CM

## 2024-08-05 DIAGNOSIS — R001 Bradycardia, unspecified: Secondary | ICD-10-CM

## 2024-08-05 DIAGNOSIS — I10 Essential (primary) hypertension: Secondary | ICD-10-CM

## 2024-08-05 DIAGNOSIS — I5021 Acute systolic (congestive) heart failure: Secondary | ICD-10-CM

## 2024-08-05 NOTE — Patient Instructions (Addendum)
 Thank you for choosing [facility]!     Medication Instructions:  Your medication list has been updated. *If you need a refill on your cardiac medications before your next appointment, please call your pharmacy*   Lab Work: No labs were ordered during today's visit.  If you have labs (blood work) drawn today and your tests are completely normal, you will receive your results only by: MyChart Message (if you have MyChart) OR A paper copy in the mail If you have any lab test that is abnormal or we need to change your treatment, we will call you to review the results.   Testing/Procedures: No procedures were ordered during today's visit.    Your next appointment:   3 month(s)   Provider:   Darryle ONEIDA Decent, MD     Follow-Up: At [facility], you and your health needs are our priority.  As part of our continuing mission to provide you with exceptional heart care, we have created designated Provider Care Teams.  These Care Teams include your primary Cardiologist (physician) and Advanced Practice Providers (APPs -  Physician Assistants and Nurse Practitioners) who all work together to provide you with the care you need, when you need it. We recommend signing up for the patient portal called MyChart.  Sign up information is provided on this After Visit Summary.  MyChart is used to connect with patients for Virtual Visits (Telemedicine).  Patients are able to view lab/test results, encounter notes, upcoming appointments, etc.  Non-urgent messages can be sent to your provider as well.   To learn more about what you can do with MyChart, go to [email or web address].

## 2024-08-05 NOTE — Telephone Encounter (Signed)
 Following patient appointment reviewed case with Dr. Kate, he agreed with recommendation for further evaluation with cardiac MRI.  Attempted to call patient regarding recommendations and was unable to reach patient.   Recommend that patient undergo cardiac MRI for further evaluation given recurrent Takotsubo cardiomyopathy, if she is agreeable she will need CBC prior to imaging.

## 2024-08-06 ENCOUNTER — Ambulatory Visit (HOSPITAL_COMMUNITY)

## 2024-08-06 NOTE — Telephone Encounter (Signed)
 Attempted to call pt 2x at [phone]. The 1st time it sounds like someone picks up and then hangs up the phone. 2nd attempt, no answer and no voicemail available.

## 2024-08-08 ENCOUNTER — Telehealth (HOSPITAL_COMMUNITY): Payer: Self-pay | Admitting: *Deleted

## 2024-08-08 NOTE — Telephone Encounter (Signed)
 Attempted to call pt, phone continues to ring with no answer.

## 2024-08-08 NOTE — Telephone Encounter (Signed)
 Spoke with pt regarding Katlyn, NP's recommendations to order a cardiac MRI. Pt is agreeable to the plan. Orders placed for cardiac MRI and CBC. Pt will await cal to schedule her MRI and then have labs drawn within 2 weeks of scan. All questions answered and pt verbalizes understanding. Will send instructions for cardiac MRI via mychart.

## 2024-08-08 NOTE — Telephone Encounter (Signed)
 PT called and left two message on departmental voicemail regarding rescheduling cardiac rehab.  Pt previously arrived for orientation to cardiac rehab but did not complete.  Pt with recent MI on 9/8. Seen in follow up on 9/23 by Katlyn West NP with Dr. Barbaraann.  Advised for pt to have cardiac MRI completed.  Dr. Barbaraann office unsuccessful in scheduling this with pt.  Unable to reach or leave message as there is no voicemail. Once cardiac MRI completed will ask for new referral from Dr. Burton. Attempted to call pt - no answer however at the writing of this note, pt did call back.  Advised pt to please call office to set up an appt for Cardiac MRI.  Once completed we will ask for referral from Dr. Barbaraann. Didn't realize the office was calling her - may be something with the phone?? Verbalized understanding and will call the office to set up Cardiac MRI. Saturnino Bernett PEAK, BSN Cardiac and Emergency planning/management officer

## 2024-08-11 ENCOUNTER — Other Ambulatory Visit: Payer: Self-pay

## 2024-08-11 MED ORDER — AMLODIPINE BESYLATE 2.5 MG PO TABS: 2.5000 mg | ORAL_TABLET | Freq: Every day | ORAL | 3 refills | Status: AC

## 2024-08-14 NOTE — Progress Notes (Signed)
 Office Visit Note  Patient: Sherry Daniels             Date of Birth: 1960/12/03           MRN: [id number]             PCP: Paseda, Folashade R, FNP Referring: Paseda, Folashade R, FNP Visit Date: 08/26/2024 Occupation: Data Unavailable  Subjective:  Pain in multiple joints and fatigue  History of Present Illness: Sherry Daniels is a 63 y.o. female with positive ANA, sicca symptoms and fibromyalgia.  She returns today after her last visit in April 2025.  Patient states she had 2 episodes of MRI since her last visit both were stress-induced.  She also had elevated blood pressure both times.  The first episode was in May 2025 and second  Sherry 2025.  She was placed on the combination of HCTZ, losartan , amlodipine .  She was also given isosorbide 15 mg daily.  Cardiac MRI is pending at this Monday.  She continues to have dry eyes.  She has mild symptoms of dry mouth.  She continues to have pain in her joints and muscles.  She gives history of photosensitivity and intermittent rash.  There is no history of oral ulcers, nasal ulcers, malar rash, lymphadenopathy.  There is no history of inflammatory arthritis.    Activities of Daily Living:  Patient reports morning stiffness for all day. Patient Reports nocturnal pain.  Difficulty dressing/grooming: Denies Difficulty climbing stairs: Denies Difficulty getting out of chair: Reports Difficulty using hands for taps, buttons, cutlery, and/or writing: Denies  Review of Systems  Constitutional:  Positive for fatigue.  HENT:  Negative for mouth sores and mouth dryness.   Eyes:  Positive for dryness.  Respiratory:  Positive for shortness of breath.   Cardiovascular:  Positive for chest pain and palpitations.  Gastrointestinal:  Positive for constipation and diarrhea. Negative for blood in stool.  Endocrine: Negative for increased urination.  Genitourinary:  Negative for involuntary urination.  Musculoskeletal:  Positive for  joint pain, joint pain, myalgias, morning stiffness, muscle tenderness and myalgias. Negative for gait problem, joint swelling and muscle weakness.  Skin:  Positive for rash, hair loss and sensitivity to sunlight. Negative for color change.  Allergic/Immunologic: Negative for susceptible to infections.  Neurological:  Positive for headaches. Negative for dizziness.  Hematological:  Negative for swollen glands.  Psychiatric/Behavioral:  Positive for depressed mood and sleep disturbance. The patient is nervous/anxious.     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 Kristie   Fibromyalgia    GERD (gastroesophageal reflux disease)  Heart attack (HCC)    03/2024, 07/2024 per patient   Hyperlipidemia    Hypertension    Migraine    Non-ischemic cardiomyopathy (HCC) 03/25/2024   Nonalcoholic fatty liver disease     Family History  Problem Relation Age of Onset   Cancer Mother        stage III, colon   Hypertension Mother    Dementia Mother    Heart disease Mother         bradycardia   Stroke Mother    Cancer Father        prostate   Hypertension Father    Hyperlipidemia Father    Heart disease Father 21       MI    Hyperlipidemia Sister    Hypertension Sister    Fibromyalgia Sister    Arthritis Brother        RA   Hypertension Brother    Hyperlipidemia Brother    Fibromyalgia Brother    Hypertension Brother    Hyperlipidemia Brother    Healthy Daughter    Healthy Son    Lupus Niece    Past Surgical History:  Procedure Laterality Date   ABDOMINAL HYSTERECTOMY     total   CARDIAC CATHETERIZATION  07/21/2024   COLONOSCOPY  09/15/2009   Dr. Kristie; normal   LEFT HEART CATH AND CORONARY ANGIOGRAPHY N/A 03/14/2024   Procedure: LEFT HEART CATH AND CORONARY ANGIOGRAPHY;  Surgeon: Wonda Sharper, MD;  Location: [facility] INVASIVE CV LAB;  Service: Cardiovascular;  Laterality: N/A;   WISDOM TOOTH EXTRACTION     Social History   Tobacco Use   Smoking status: Never    Passive exposure: Never   Smokeless tobacco: Never  Vaping Use   Vaping status: Never Used  Substance Use Topics   Alcohol use: No    Alcohol/week: 0.0 standard drinks of alcohol   Drug use: No   Social History   Social History Narrative   Married, 1 daughter and 1 son, pentecostal, exercise most days, walking, biking, running.    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  Constitutional:      Appearance: She is well-developed.  HENT:     Head: Normocephalic and atraumatic.  Eyes:     Conjunctiva/sclera: Conjunctivae normal.  Cardiovascular:     Rate and Rhythm: Normal rate and regular rhythm.     Heart sounds: Normal heart sounds.   Pulmonary:     Effort: Pulmonary effort is normal.     Breath sounds: Normal breath sounds.  Abdominal:     General: Bowel sounds are normal.     Palpations: Abdomen is soft.  Musculoskeletal:     Cervical back: Normal range of motion.  Lymphadenopathy:     Cervical: No cervical adenopathy.  Skin:    General: Skin is warm and dry.     Capillary Refill: Capillary refill takes less than 2 seconds.  Neurological:     Mental Status: She is alert and oriented to person, place, and time.  Psychiatric:        Behavior: Behavior  normal.      Musculoskeletal Exam: Cervical, thoracic and lumbar spine were in good range of motion.  There was no SI joint tenderness.  Shoulder joints, elbow joints, wrist joints, MCPs, PIPs and DIPs were in good range of motion with no synovitis.  Hip joints and knee joints were in good range of motion without any warmth swelling or effusion.  There was no tenderness over ankles or MTPs.   CDAI Exam: CDAI Score: -- Patient Global: --; Provider Global: -- Swollen: --; Tender: -- Joint Exam 08/26/2024   No joint exam has been documented for this visit   There is currently no information documented on the homunculus. Go to the Rheumatology activity and complete the homunculus joint exam.  Investigation: No additional findings.  Imaging: No results found.   Recent Labs: Lab Results  Component Value Date   WBC 4.0 08/25/2024   HGB 11.3 08/25/2024   PLT 203 08/25/2024   NA 142 07/24/2024   K 3.8 07/24/2024   CL 105 07/24/2024   CO2 28 07/24/2024   GLUCOSE 97 07/24/2024   BUN 8 07/24/2024   CREATININE 0.80 07/24/2024   BILITOT 0.6 07/24/2024   ALKPHOS 57 07/24/2024   AST 39 07/24/2024   ALT 49 (H) 07/24/2024   PROT 7.5 07/24/2024   ALBUMIN 3.6 07/24/2024   CALCIUM  9.4 07/24/2024   GFRAA 119 05/03/2021   Mar 15, 2024 dsDNA negative, sed rate 20 February 25, 2024 ANA 1: 640 nuclear dot, 1: 80 NS, ENA panel negative, complements normal.  RF  negative, anti-CCP negative, MCV negative, TSH normal, CK 61, vitamin D  22, sed rate 22, urine protein creatinine ratio normal.  Speciality Comments: ANA negative, ENA negative, RF negative ANA initially was positive.  C1q positive, anti-CarP positive  Procedures:  No procedures performed Allergies: Aspirin , Codeine, Cymbalta  [duloxetine  hcl], and Simvastatin   Assessment / Plan:     Visit Diagnoses: Sjogren's syndrome with other organ involvement - sicca, fatigue, arthralgia, +ANA, photosensitivity: She complains of joint pain, joint inflammation, dry mouth and dry eye symptoms.  She has positive ANA.  SSA and SSB antibodies have been negative.  ENA panel was negative in April 2025.  No synovitis was noted on the examination.  Patient gives history of significant fatigue.  She states her sister has similar symptoms and did much better on hydroxychloroquine.  She would like to try hydroxychloroquine.  Indications, side effects of hydroxychloroquine were discussed at length.  A handout was given and consent was taken.  Patient wants to discuss with her cardiologist before starting hydroxychloroquine.  Positive ANA (antinuclear antibody) - 05/03/2021: ANA 1:320 nuclear fine speckled, 1: 80 mitotic, intercellular bridge, ESR within normal limits, complements WNL, double-stranded DNA negative. - Plan: Protein / creatinine ratio, urine, ANA, Anti-DNA antibody, double-stranded, C3 and C4, Sedimentation rate, Sjogrens syndrome-A extractable nuclear antibody today.  Will call her with the results.  High risk medication use-she was advised to get a baseline eye examination and then annual eye examination to monitor for ocular toxicity.  Will check labs in a month after starting hydroxychloroquine and then 3 months.  Information reimmunization was placed in the AVS.  Elevated LFTs - Plan: AST, ALT.  Patient's LFTs were elevated recently.  I will recheck LFTs today.  She started new medications after MI.  Pain  in both hands -he complains of pain and discomfort in the bilateral hands.  No synovitis was noted on the examination.  X-rays of both hands were updated at her  last office visit on 01/08/2024 which were consistent with early degenerative changes.  Chondromalacia of patella, unspecified laterality-she has intermittent discomfort in her knee joints.  Pain in both feet -she complains of pain and swelling in her both feet.  No synovitis was noted on the examination.  X-ray showed early degenerative changes on 01/08/2024.  No erosive changes noted.  She wears high heels.  I advised her to wear shoes with less heel.  Fibromyalgia-she continues to have generalized pain and fatigue.  Need for regular exercise and stretching was discussed.  Patient states since the episode of her MRI she has been doing stretching exercises, yoga and meditation.  Other fatigue-she complains of increased fatigue.  Vitamin D  deficiency-her vitamin D  was low at 22 on February 25, 2024.  Will recheck vitamin D  level.  Essential hypertension-blood pressure was normal at 126/78.  Patient states she started 2 new medications and is on combination of medications now.  NSTEMI (non-ST elevated myocardial infarction) (HCC)-in May 2025 and Sherry 2025.  Patient states she was under a lot of stress at home blood pressure was elevated.  Other medical problems are listed as follows:  History of migraine  Gastroesophageal reflux disease without esophagitis  Nonalcoholic fatty liver disease  History of pleurisy - 2013  Pure hypercholesterolemia  Family history of rheumatoid arthritis- maternal grandmother.  Patient states her sister has autoimmune disease.  Orders: Orders Placed This Encounter  Procedures   AST   ALT   Protein / creatinine ratio, urine   ANA   Anti-DNA antibody, double-stranded   C3 and C4   Sedimentation rate   Sjogrens syndrome-A extractable nuclear antibody   No orders of the defined types were  placed in this encounter.   Follow-Up Instructions: Return in about 3 months (around 11/26/2024) for Sjogren's.   Sherry Nash, MD  Note - This record has been created using Animal nutritionist.  Chart creation errors have been sought, but may not always  have been located. Such creation errors do not reflect on  the standard of medical care.

## 2024-08-15 ENCOUNTER — Encounter: Payer: Self-pay | Admitting: Cardiovascular Disease

## 2024-08-26 ENCOUNTER — Ambulatory Visit: Payer: Self-pay | Admitting: Cardiology

## 2024-08-26 ENCOUNTER — Encounter: Payer: Self-pay | Admitting: Rheumatology

## 2024-08-26 ENCOUNTER — Telehealth: Payer: Self-pay | Admitting: *Deleted

## 2024-08-26 ENCOUNTER — Telehealth: Payer: Self-pay | Admitting: Pharmacist

## 2024-08-26 ENCOUNTER — Ambulatory Visit: Attending: Rheumatology | Admitting: Rheumatology

## 2024-08-26 VITALS — BP 126/78 | HR 68 | Temp 97.2°F | Resp 15 | Ht 66.0 in | Wt 166.4 lb

## 2024-08-26 DIAGNOSIS — R7689 Other specified abnormal immunological findings in serum: Secondary | ICD-10-CM

## 2024-08-26 DIAGNOSIS — M79642 Pain in left hand: Secondary | ICD-10-CM

## 2024-08-26 DIAGNOSIS — M3509 Sicca syndrome with other organ involvement: Secondary | ICD-10-CM | POA: Diagnosis not present

## 2024-08-26 DIAGNOSIS — M79671 Pain in right foot: Secondary | ICD-10-CM

## 2024-08-26 DIAGNOSIS — M79641 Pain in right hand: Secondary | ICD-10-CM

## 2024-08-26 DIAGNOSIS — M3500 Sicca syndrome, unspecified: Secondary | ICD-10-CM

## 2024-08-26 DIAGNOSIS — R7989 Other specified abnormal findings of blood chemistry: Secondary | ICD-10-CM

## 2024-08-26 DIAGNOSIS — Z8669 Personal history of other diseases of the nervous system and sense organs: Secondary | ICD-10-CM

## 2024-08-26 DIAGNOSIS — M2240 Chondromalacia patellae, unspecified knee: Secondary | ICD-10-CM

## 2024-08-26 DIAGNOSIS — I10 Essential (primary) hypertension: Secondary | ICD-10-CM

## 2024-08-26 DIAGNOSIS — M79672 Pain in left foot: Secondary | ICD-10-CM

## 2024-08-26 DIAGNOSIS — Z8709 Personal history of other diseases of the respiratory system: Secondary | ICD-10-CM

## 2024-08-26 DIAGNOSIS — Z79899 Other long term (current) drug therapy: Secondary | ICD-10-CM | POA: Diagnosis not present

## 2024-08-26 DIAGNOSIS — K760 Fatty (change of) liver, not elsewhere classified: Secondary | ICD-10-CM

## 2024-08-26 DIAGNOSIS — E7800 Pure hypercholesterolemia, unspecified: Secondary | ICD-10-CM

## 2024-08-26 DIAGNOSIS — I214 Non-ST elevation (NSTEMI) myocardial infarction: Secondary | ICD-10-CM

## 2024-08-26 DIAGNOSIS — M797 Fibromyalgia: Secondary | ICD-10-CM

## 2024-08-26 DIAGNOSIS — Z8261 Family history of arthritis: Secondary | ICD-10-CM

## 2024-08-26 DIAGNOSIS — E559 Vitamin D deficiency, unspecified: Secondary | ICD-10-CM

## 2024-08-26 DIAGNOSIS — R5383 Other fatigue: Secondary | ICD-10-CM

## 2024-08-26 DIAGNOSIS — K219 Gastro-esophageal reflux disease without esophagitis: Secondary | ICD-10-CM

## 2024-08-26 LAB — CBC
Hematocrit: 34.3 % (ref 34.0–46.6)
Hemoglobin: 11.3 g/dL (ref 11.1–15.9)
MCH: 29.9 pg (ref 26.6–33.0)
MCHC: 32.9 g/dL (ref 31.5–35.7)
MCV: 91 fL (ref 79–97)
Platelets: 203 x10E3/uL (ref 150–450)
RBC: 3.78 x10E6/uL (ref 3.77–5.28)
RDW: 12.7 % (ref 11.7–15.4)
WBC: 4 x10E3/uL (ref 3.4–10.8)

## 2024-08-26 NOTE — Telephone Encounter (Signed)
 Pending patient's discussion with cardiology, patient may be hydroxychloroquine new start  Does: 200mg  twice daily Mondays through Fridays only  Please confirm she has ophthalmologist Repeat CBC/CMP in 1 month, 3 months, then every 5 months  Sherry Pennant, PharmD, MPH, BCPS, CPP Clinical Pharmacist [facility])

## 2024-08-26 NOTE — Telephone Encounter (Signed)
-----   Message from Darryle ONEIDA Decent sent at 08/25/2024 12:28 PM EDT ----- Regarding: Follow-up She needs to see me this week or next.   Signed, Darryle ONEIDA. Decent, MD, [facility]  [facility]  [address] [place], [place] [address] [phone]  12:28 PM

## 2024-08-26 NOTE — Patient Instructions (Signed)
 Hydroxychloroquine Tablets What is this medication? HYDROXYCHLOROQUINE (hye drox ee KLOR oh kwin) treats autoimmune conditions, such as rheumatoid arthritis and lupus. It works by slowing down an overactive immune system. It may also be used to prevent and treat malaria. It works by killing the parasite that causes malaria. It belongs to a group of medications called DMARDs. This medicine may be used for other purposes; ask your health care provider or pharmacist if you have questions. COMMON BRAND NAME(S): Plaquenil, Quineprox, SOVUNA What should I tell my care team before I take this medication? They need to know if you have any of these conditions: Diabetes Eye disease, vision problems Frequently drink alcohol G6PD deficiency Heart disease Irregular heartbeat or rhythm Kidney disease Liver disease Porphyria Psoriasis An unusual or allergic reaction to hydroxychloroquine, other medications, foods, dyes, or preservatives Pregnant or trying to get pregnant Breastfeeding How should I use this medication? Take this medication by mouth with water. Take it as directed on the prescription label. Do not cut, crush, or chew this medication. Swallow the tablets whole. Take it with food. Do not take it more than directed. Take all of this medication unless your care team tells you to stop it early. Keep taking it even if you think you are better. Take products with antacids in them at a different time of day than this medication. Take this medication 4 hours before or 4 hours after antacids. Talk to your care team if you have questions. Talk to your care team about the use of this medication in children. While this medication may be prescribed for selected conditions, precautions do apply. Overdosage: If you think you have taken too much of this medicine contact a poison control center or emergency room at once. NOTE: This medicine is only for you. Do not share this medicine with others. What if I  miss a dose? If you miss a dose, take it as soon as you can. If it is almost time for your next dose, take only that dose. Do not take double or extra doses. What may interact with this medication? Do not take this medication with any of the following: Cisapride Dronedarone Pimozide Thioridazine This medication may also interact with the following: Ampicillin Antacids Cimetidine Cyclosporine Digoxin Kaolin Medications for diabetes, such as insulin, glipizide, glyburide Medications for seizures, such as carbamazepine, phenobarbital, phenytoin Mefloquine Methotrexate Other medications that cause heart rhythm changes Praziquantel This list may not describe all possible interactions. Give your health care provider a list of all the medicines, herbs, non-prescription drugs, or dietary supplements you use. Also tell them if you smoke, drink alcohol, or use illegal drugs. Some items may interact with your medicine. What should I watch for while using this medication? Visit your care team for regular checks on your progress. Tell your care team if your symptoms do not start to get better or if they get worse. You may need blood work done while you are taking this medication. If you take other medications that can affect heart rhythm, you may need more testing. Talk to your care team if you have questions. Your vision may be tested before and during use of this medication. Tell your care team right away if you have any change in your eyesight. This medication may cause serious skin reactions. They can happen weeks to months after starting the medication. Contact your care team right away if you notice fevers or flu-like symptoms with a rash. The rash may be red or purple and then  turn into blisters or peeling of the skin. Or, you might notice a red rash with swelling of the face, lips or lymph nodes in your neck or under your arms. If you or your family notice any changes in your behavior, such as  new or worsening depression, thoughts of harming yourself, anxiety, or other unusual or disturbing thoughts, or memory loss, call your care team right away. What side effects may I notice from receiving this medication? Side effects that you should report to your care team as soon as possible: Allergic reactions--skin rash, itching, hives, swelling of the face, lips, tongue, or throat Aplastic anemia--unusual weakness or fatigue, dizziness, headache, trouble breathing, increased bleeding or bruising Change in vision Heart rhythm changes--fast or irregular heartbeat, dizziness, feeling faint or lightheaded, chest pain, trouble breathing Infection--fever, chills, cough, or sore throat Low blood sugar (hypoglycemia)--tremors or shaking, anxiety, sweating, cold or clammy skin, confusion, dizziness, rapid heartbeat Muscle injury--unusual weakness or fatigue, muscle pain, dark yellow or brown urine, decrease in amount of urine Pain, tingling, or numbness in the hands or feet Rash, fever, and swollen lymph nodes Redness, blistering, peeling, or loosening of the skin, including inside the mouth Thoughts of suicide or self-harm, worsening mood, or feelings of depression Unusual bruising or bleeding Side effects that usually do not require medical attention (report to your care team if they continue or are bothersome): Diarrhea Headache Nausea Stomach pain Vomiting This list may not describe all possible side effects. Call your doctor for medical advice about side effects. You may report side effects to FDA at 1-800-FDA-1088. Where should I keep my medication? Keep out of the reach of children and pets. Store at room temperature up to 30 degrees C (86 degrees F). Protect from light. Get rid of any unused medication after the expiration date. To get rid of medications that are no longer needed or have expired: Take the medication to a medication take-back program. Check with your pharmacy or law  enforcement to find a location. If you cannot return the medication, check the label or package insert to see if the medication should be thrown out in the garbage or flushed down the toilet. If you are not sure, ask your care team. If it is safe to put it in the trash, empty the medication out of the container. Mix the medication with cat litter, dirt, coffee grounds, or other unwanted substance. Seal the mixture in a bag or container. Put it in the trash. NOTE: This sheet is a summary. It may not cover all possible information. If you have questions about this medicine, talk to your doctor, pharmacist, or health care provider.  2024 Elsevier/Gold Standard (2022-05-08 00:00:00)  Standing Labs We placed an order today for your standing lab work.   Please have your standing labs drawn in 1 month after starting Plaquenil, then every 3 months  Please have your labs drawn 2 weeks prior to your appointment so that the provider can discuss your lab results at your appointment, if possible.  Please note that you may see your imaging and lab results in MyChart before we have reviewed them. We will contact you once all results are reviewed. Please allow our office up to 72 hours to thoroughly review all of the results before contacting the office for clarification of your results.  WALK-IN LAB HOURS  Monday through Thursday from 8:00 am -12:30 pm and 1:00 pm-4:30 pm and Friday from 8:00 am-12:00 pm.  Patients with office visits requiring labs will  be seen before walk-in labs.  You may encounter longer than normal wait times. Please allow additional time. Wait times may be shorter on  Monday and Thursday afternoons.  We do not book appointments for walk-in labs. We appreciate your patience and understanding with our staff.   Labs are drawn by [facility]. Please bring your co-pay at the time of your lab draw.  You may receive a bill from [facility] for your lab work.  Please note if you are on  Hydroxychloroquine and and an order has been placed for a Hydroxychloroquine level,  you will need to have it drawn 4 hours or more after your last dose.  If you wish to have your labs drawn at another location, please call the office 24 hours in advance so we can fax the orders.  The office is located at [address], [address], [place], [place] [address]   If you have any questions regarding directions or hours of operation,  please call [phone].   As a reminder, please drink plenty of water prior to coming for your lab work. Thanks!   Vaccines You are taking a medication(s) that can suppress your immune system.  The following immunizations are recommended: Flu annually Covid-19  RSV Td/Tdap (tetanus, diphtheria, pertussis) every 10 years Pneumonia (Prevnar 15 then Pneumovax 23 at least 1 year apart.  Alternatively, can take Prevnar 20 without needing additional dose) Shingrix: 2 doses from 4 weeks to 6 months apart  Please check with your PCP to make sure you are up to date.

## 2024-08-26 NOTE — Telephone Encounter (Signed)
 Called unable to leave a message. RN sent a message by [employer] with an appointment time. ( 09/05/24 at 1:40 pm)   May contact office if unavailable.

## 2024-08-26 NOTE — Progress Notes (Signed)
 Pharmacy Note  Subjective: Patient presents today to [facility] for follow up office visit.   Patient seen by the pharmacist for counseling on hydroxychloroquine positive ANA.  Prior therapy includes:none.  Objective: CMP     Component Value Date/Time   NA 142 07/24/2024 1036   NA 141 06/12/2024 1422   K 3.8 07/24/2024 1036   CL 105 07/24/2024 1036   CO2 28 07/24/2024 1024   GLUCOSE 97 07/24/2024 1036   BUN 8 07/24/2024 1036   BUN 10 06/12/2024 1422   CREATININE 0.80 07/24/2024 1036   CREATININE 0.64 02/25/2024 1322   CALCIUM  9.4 07/24/2024 1024   PROT 7.5 07/24/2024 1024   ALBUMIN 3.6 07/24/2024 1024   AST 39 07/24/2024 1024   ALT 49 (H) 07/24/2024 1024   ALKPHOS 57 07/24/2024 1024   BILITOT 0.6 07/24/2024 1024   GFRNONAA >60 07/24/2024 1024   GFRNONAA 102 05/03/2021 0000   GFRAA 119 05/03/2021 0000    CBC    Component Value Date/Time   WBC 4.0 08/25/2024 1444   WBC 4.7 07/24/2024 1024   RBC 3.78 08/25/2024 1444   RBC 4.04 07/24/2024 1024   HGB 11.3 08/25/2024 1444   HCT 34.3 08/25/2024 1444   PLT 203 08/25/2024 1444   MCV 91 08/25/2024 1444   MCH 29.9 08/25/2024 1444   MCH 29.7 07/24/2024 1024   MCHC 32.9 08/25/2024 1444   MCHC 32.2 07/24/2024 1024   RDW 12.7 08/25/2024 1444   LYMPHSABS 1.9 07/24/2024 1024   MONOABS 0.4 07/24/2024 1024   EOSABS 0.2 07/24/2024 1024   BASOSABS 0.0 07/24/2024 1024    Assessment/Plan: Patient was counseled on the purpose, proper use, and adverse effects of hydroxychloroquine including nausea/diarrhea, skin rash, headaches, and sun sensitivity.  Advised patient to wear sunscreen once starting hydroxychloroquine to reduce risk of rash associated with sun sensitivity.  Discussed importance of annual eye exams while on hydroxychloroquine to monitor to ocular toxicity and discussed importance of frequent laboratory monitoring.  Provided patient with eye exam form for baseline ophthalmologic exam.  Provided patient with  educational materials on hydroxychloroquine and answered all questions.  Patient consented to hydroxychloroquine. Will upload consent in the media tab.    Reviewed risk for QTC prolongation when used in combination with other QTc prolonging agents (including but not limited to antiarrhythmics, macrolide antibiotics, flouroquinolone antibiotics, haloperidol, quetiapine, olanzapine, risperidone, droperidol, ziprasidone, amitriptyline , citalopram , ondansetron , migraine triptans, and methadone).   Dose will be Plaquenil 200 mg twice daily Monday through Friday.  Prescription pending lab results and consultation with cardiology.

## 2024-08-27 NOTE — Progress Notes (Signed)
 Sent to front office. KH

## 2024-08-27 NOTE — Progress Notes (Addendum)
 ------------------------------------------------------------------------------- Attestation signed by Daniels Sherry Righter, MD at 09/02/24 1736 I saw and evaluated the patient, participating in the key portions of the service, on the date of service.  I reviewed the fellow's note and all of the data/testing/images.  I agree with the fellow's findings and plan.  The patient would like to follow up with both [facility] ([facility]) and me, which is reasonable for now.    Sherry DELENA Luevenia, MD -------------------------------------------------------------------------------    DIVISION OF CARDIOLOGY [employer] [facility] [employer] , [place]       Date of Service: 08/28/2024   PCP: Referring Provider:  Pcp, None Per Patient [address] [place] [facility] [place] [address] Phone: None Fax: None Pcp, None Per Patient [address] [place],  [place] [address] Phone: N/A Fax:    Assessment and Plan:  Ms. Sherry Daniels is a 63 y.o. female with a history of Takotsubo / stress cardiomyopathy (May 2025, September 2025), mitral valve regurgitation, ?bradycardia, hypertension, asthma, hyperlipidemia, GERD, fibromyalgia, nonalcoholic steatohepatitis, and anxiety. Presents today for post-hospitalization follow up. Hospitalized in September 2025 for stress cardiomyopathy vs vasospasm.  # Stress cardiomyopathy, recurrent (03/2024, 07/2024) vs vasospasm # Non-ischemic cardiomyopathy Assessment: - History of Takotsubo 03/2024; admission for possible NSTEMI. Cardiac catheterization revealed minimal nonobstructive CAD, severe segmental LV dysfunction with periapical ballooning, hyperdynamic basal segments, LVEF estimated 35%. TTE 05/2024 showed recovered EF - Recurrence 07/2024; again admitted for concerns of NSTEMI. LHC showed LVEDP of 16 mmHg, LVEF 30-35%, anterior apical hypokinesis, and no significant obstructive coronary artery disease, consistent with stress cardiomyopathy. Formal echo the next day showed normal EF with  mild mitral regurgitation with no wall abnormalities Plan: [ ]  Cardiac MRI planned for Monday, ordered by [facility] [ ]  Stop Imdur (fatigue, worsened headaches) - Continue PRN nitroglycerin  - Continue working on exercise  # Hyperlipidemia - LDL 156 (03/2024). Previously on statin, self-discontinued earlier this summer due to muscle aches - ASCVD risk ~6%. Consider trial of Rosuva 5 mg in the future - She is taking ASA 81 mg  # Hypertension - Continue hydrochlorothiazide  12.5 mg daily, Losartan  50 mg daily [ ]  Start blood pressure log at home, goal SBP <130 [ ]  Stop Imdur 30 my daily  # Mitral valve regurgitation, mild  The patient was seen and discussed with Dr. Luevenia.  Return in about 6 months (around 02/26/2025).  Sherry Ribas, MD  Subjective:     Reason for Consultation: Post-hospital discharge follow up  History of Present Illness: Sherry Daniels is a 63 y.o. 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  Recently admitted overnight 9/08-9/09 for chest pain and troponin elevation, peak of 1563. EKG showed possible t wave inversion in V1-V3. LHC showed LVEDP of 16 mmHg, LVEF 30-35%, anterior apical hypokinesis, and no significant obstructive coronary artery disease, consistent with stress cardiomyopathy. Formal echo the day following showed normal EF with mild mitral regurgitation with no wall abnormalities. She was discharged on Imdur (isosorbide mononitrate) 30 mg daily and sublingual nitroglycerin  0.3mg  as a PRN for chest pain, given patient's subjective response.   Follows with a cardiologist at [facility]. Scheduled cardiac MRI for Monday. Has seen them since her discharge. Tried to do cardiac rehab but she was turned away  She has been okay since discharge, but has  felt fatigued, and her chest has felt heavy. She has had a few episodes of chest  discomfort for which she took the PRN nitroglycerin . In setting of stress, checked her BP at it was also elevated. Has been in ER for severe migraines, Imdur was reduced to 15 and her symptoms improved.   Chest heaviness - describes it not as pressure, not similar to the chest pain she had that caused her to present. Doesn't go away. Exertion does not make it worse.   Fatigue - has felt generally tired. Walking 5-10 min on level surface. That is her max right now   Cardiovascular History: - Takotsubo cardiomyopathy (May 2025, September 2025) - Non-obstructive coronary artery disease - Mitral valve regurgitation  Cardiac Meds: - ASA 81 mg daily, hydrochlorothiazide  12.5 mg daily, Imdur 30 my daily, Losartan  50 mg daily  Past medical history: Problem List[1]  Medications:  Patient's Medications  New Prescriptions   No medications on file  Previous Medications   ALBUTEROL  (ACCUNEB ) 0.63 MG/3 ML NEBULIZER SOLUTION    Inhale 3 mL (0.63 mg total) by nebulization every six (6) hours as needed for wheezing.   ALPRAZOLAM  (XANAX ) 0.25 MG TABLET    Take 1 tablet (0.25 mg total) by mouth.   AMLODIPINE  (NORVASC ) 2.5 MG TABLET    Take 1 tablet (2.5 mg total) by mouth daily. TAKE 1 TABLET (2.5 MG TOTAL) BY MOUTH DAILY.   ASPIRIN  (ECOTRIN) 81 MG TABLET    Take 1 tablet (81 mg total) by mouth daily.   BUSPIRONE (BUSPAR) 5 MG TABLET       DIAZEPAM  (VALIUM ) 5 MG TABLET    Oral   ESOMEPRAZOLE  (NEXIUM ) 20 MG CAPSULE    Take 1 capsule (20 mg total) by mouth daily before breakfast.   ESTRADIOL (ESTRACE) 0.01 % (0.1 MG/GRAM) VAGINAL CREAM    Insert 1 g into the vagina.   ESTRADIOL (VIVELLE) 0.1 MG/24 HR    Transdermal   HYDROCHLOROTHIAZIDE  12.5 MG TABLET    Take 1 tablet (12.5 mg total) by mouth daily.   LOSARTAN  (COZAAR ) 50 MG TABLET    Take 1 tablet (50 mg total) by mouth daily.   NITROGLYCERIN  (NITROSTAT ) 0.3 MG SL TABLET     Place 1 tablet (0.3 mg total) under the tongue every five (5) minutes as needed for chest pain.  Modified Medications   No medications on file  Discontinued Medications   ISOSORBIDE MONONITRATE (IMDUR) 30 MG 24 HR TABLET    Take 1 tablet (30 mg total) by mouth daily.    Allergies: Allergies[2]  Social History: She  reports that she has never smoked. She has never been exposed to tobacco smoke. She has never used smokeless tobacco. She reports that she does not drink alcohol and does not use drugs.  Family History: Her family history includes Colon cancer in her mother; Heart attack in her father; Heart disease in her father; Hyperlipidemia in her brother, brother, and sister; Hypertension in her brother, brother, father, and sister; Prostate cancer in her father; Vascular dementia in her mother; mini stroke in her brother; mini strokes in her mother.  Review of Systems No fevers/chills, no n/v. 10 systems were reviewed and negative except as noted in HPI.    Objective:     Physical Exam BP 132/77 (BP Site: L Arm, BP Position: Sitting, BP Cuff Size: Medium)   Pulse 68   Ht 167.6 cm (5' 5.98)   Wt 75.4 kg (166 lb 3.2 oz)   SpO2 98%   BMI 26.84 kg/m   Wt Readings from Last 3 Encounters:  08/28/24 75.4  kg (166 lb 3.2 oz)  07/21/24 71.7 kg (158 lb)  02/05/11 59 kg (129 lb 15.7 oz)    General: Alert, NAD, sitting up comfortably in chair at side of exam table HEENT: Sclera anicteric Cardiac: normal rate, regular rhythm, no murmurs rubs or gallops Pulmonary: CTAB, no increased work of breathing Abdomen: Soft, non-tender, non distended.  Extremities: No LE edema. Legs are warm Neuro: Alert and oriented. No focal deficits     Most recent labs  Lab Results  Component Value Date   Sodium 142 07/22/2024   Potassium 3.8 07/22/2024   Chloride 104 07/22/2024   CO2 28.0 07/22/2024   BUN 7 (L) 07/22/2024   Creatinine 0.55 07/22/2024   Magnesium  1.6 07/22/2024   Lab Results   Component Value Date   HGB 10.8 (L) 07/22/2024   MCV 88.3 07/22/2024   Platelet 192 07/22/2024   Lab Results  Component Value Date   TSH 0.713 07/21/2024   PRO-BNP <35.0 07/21/2024   ECG: HR 62, normal sinus rhythm, left atrial enlargement - (personally reviewed)  Echo:   07/22/24 Summary   1. The left ventricle is normal in size with normal wall thickness.   2. The left ventricular systolic function is normal with no regional wall motion abnormalities, LVEF is visually estimated at > 55%.   3. The mitral valve leaflets are mildly thickened with normal leaflet mobility.   4. There is mild mitral valve regurgitation.   5. The right ventricle is normal in size, with normal systolic function.  05/15/2024  1. Left ventricular ejection fraction, by estimation, is 60 to 65%. The left ventricle has normal function. The left ventricle has no regional wall motion abnormalities. The average left ventricular global longitudinal strain is -19.4 %. The global  longitudinal strain is normal.   2. Right ventricular systolic function is normal. The right ventricular size is normal. There is normal pulmonary artery systolic pressure. The estimated right ventricular systolic pressure is 16.5 mmHg.   3. The mitral valve is normal in structure. Mild mitral valve regurgitation. No evidence of mitral stenosis.   4. The aortic valve is normal in structure. Aortic valve regurgitation is not visualized. No aortic stenosis is present.   5. The inferior vena cava is normal in size with greater than 50% respiratory variability, suggesting right atrial pressure of 3 mmHg.   03/15/24  1. Mid/apical inferior wall septal and apical akinesis . Left ventricular ejection fraction, by estimation, is 40 to 45%. The left ventricle has mildly decreased function. The left ventricle demonstrates regional wall motion abnormalities (see scoring  diagram/findings for description). The left ventricular internal cavity size  was mildly dilated. Left ventricular diastolic parameters were normal. The average left ventricular global longitudinal strain is -19.5 %. The global longitudinal strain is  normal.   2. Right ventricular systolic function is normal. The right ventricular size is normal. There is normal pulmonary artery systolic pressure.   3. The mitral valve is abnormal. Mild mitral valve regurgitation. No evidence of mitral stenosis.   4. The aortic valve is tricuspid. Aortic valve regurgitation is not visualized. No aortic stenosis is present.   5. The inferior vena cava is dilated in size with >50% respiratory variability, suggesting right atrial pressure of 8 mmHg.   LHC:   07/21/24 Findings: No angiographic evidence of coronary artery disease High-normal left-sided intracardiac filling pressures (LVEDP 16mm Hg) Left ventriculogram demonstrated a moderately reduced systolic function ~30% with anteroapical hypokinesis   Recommendations:   TR  band protocol Aggressive secondary prevention with optimization of medical therapy and risk factor modification in accordance with ACC/AHA guidelines Further management per the inpatient cardiology service  03/15/24 1.  Minimal nonobstructive coronary artery disease with no significant  stenoses  2. Severe segmental LV dysfunction with periapical ballooning,  hyperdynamic basal segments, LVEF estimated at 35%, findings consistent  with acute Takotsubo syndrome   Recommendations: Will adjust her antihypertensive medications with  discontinuation of amlodipine  and we will start her on metoprolol   succinate (beta-1 selective agent in the setting of asthma), and losartan .   Check 2D echo.  Counseled patient and family on findings, prognosis, and  rationale for medical therapy.  Anticipate 24 to 48-hour hospitalization  depending on her symptoms.   Ambulatory Monitor: 10/12/2021 Enrollment 09/09/2021-09/14/2021 (5 days 11 hours). Patient had a min HR of  52 bpm  (sinus bradycardia), max HR of 152 bpm (sinus tachycardia), and avg  HR of 74 bpm (normal sinus rhythm). Predominant underlying rhythm was  Sinus Rhythm. Isolated SVEs were rare (<1.0%), and no SVE Couplets or SVE  Triplets were present. Isolated VEs were rare (<1.0%), VE Couplets were  rare (<1.0%), and no VE Triplets were present.          [1] Patient Active Problem List Diagnosis  . NSTEMI (non-ST elevated myocardial infarction) (CMS-HCC)  . GERD (gastroesophageal reflux disease)  . Anxiety  . Takotsubo cardiomyopathy  . Asthma (HHS-HCC)  . Post-menopause on HRT (hormone replacement therapy)  [2] Allergies Allergen Reactions  . Simvastatin Other (See Comments)    dizziness  . Aspirin  Nausea Only and Other (See Comments)  . Codeine Dizziness and Nausea Only  . Duloxetine  Anxiety    Felt like it worsened anxiety and fibromyalgia  . Sulfa (Sulfonamide Antibiotics) Hives

## 2024-08-28 ENCOUNTER — Ambulatory Visit: Payer: Self-pay | Admitting: Rheumatology

## 2024-08-28 LAB — TEST AUTHORIZATION

## 2024-08-28 LAB — AST: AST: 20 U/L (ref 10–35)

## 2024-08-28 LAB — SJOGRENS SYNDROME-A EXTRACTABLE NUCLEAR ANTIBODY: SSA (Ro) (ENA) Antibody, IgG: <1 AI

## 2024-08-28 LAB — PROTEIN / CREATININE RATIO, URINE
Creatinine, Urine: 86 mg/dL (ref 20–275)
Total Protein, Urine: <4 mg/dL — ABNORMAL LOW (ref 5–24)

## 2024-08-28 LAB — VITAMIN D 25 HYDROXY (VIT D DEFICIENCY, FRACTURES): Vit D, 25-Hydroxy: 33 ng/mL (ref 30–100)

## 2024-08-28 LAB — ANTI-NUCLEAR AB-TITER (ANA TITER)
ANA TITER: 1:80 {titer} — ABNORMAL HIGH
ANA Titer 1: 1:80 {titer} — ABNORMAL HIGH

## 2024-08-28 LAB — ANTI-DNA ANTIBODY, DOUBLE-STRANDED: ds DNA Ab: 2 [IU]/mL

## 2024-08-28 LAB — C3 AND C4
C3 Complement: 143 mg/dL (ref 83–193)
C4 Complement: 21 mg/dL (ref 15–57)

## 2024-08-28 LAB — ALT: ALT: 26 U/L (ref 6–29)

## 2024-08-28 LAB — SEDIMENTATION RATE: Sed Rate: 31 mm/h — ABNORMAL HIGH (ref 0–30)

## 2024-08-28 LAB — ANA: Anti Nuclear Antibody (ANA): POSITIVE — AB

## 2024-08-28 NOTE — Progress Notes (Signed)
 Urine protein creatinine ratio normal, sed rate mildly elevated at 31, liver functions are normal.  Double-stranded DNA and anti-SSA antibodies are negative complements normal, vitamin D  is low normal.  Patient should continue vitamin D  supplement.

## 2024-09-02 ENCOUNTER — Other Ambulatory Visit: Payer: Self-pay | Admitting: Cardiology

## 2024-09-02 ENCOUNTER — Ambulatory Visit (HOSPITAL_COMMUNITY)
Admission: RE | Admit: 2024-09-02 | Discharge: 2024-09-02 | Disposition: A | Discharge status: Home / Self Care | Source: Ambulatory Visit | Attending: Cardiology | Admitting: Cardiology

## 2024-09-02 DIAGNOSIS — I428 Other cardiomyopathies: Secondary | ICD-10-CM | POA: Diagnosis present

## 2024-09-02 DIAGNOSIS — I5021 Acute systolic (congestive) heart failure: Secondary | ICD-10-CM

## 2024-09-02 MED ORDER — GADOBUTROL 1 MMOL/ML IV SOLN
10.0000 mL | Freq: Once | INTRAVENOUS | Status: AC | PRN
  Administered 2024-09-02: 10 mL via INTRAVENOUS

## 2024-09-04 NOTE — Progress Notes (Unsigned)
 Cardiology Office Note:  .   Date:  09/05/2024  ID:  Sherry Daniels, DOB [birth year], MRN [id number] PCP: Paseda, Folashade R, FNP  [facility] Providers Cardiologist:  Sherry ONEIDA Decent, MD {   History of Present Illness: .    Chief Complaint  Patient presents with   Follow-up         Sherry Daniels is a 63 y.o. female with history of Takotsubo CM who presents for follow-up.    History of Present Illness   Sherry Daniels is a 63 year old female with Takotsubo cardiomyopathy who presents for follow-up.  In May 2025, she experienced a stress-induced episode of Takotsubo cardiomyopathy, characterized by fatigue, chest discomfort, pressure, nausea, dizziness, and cold sweats. Her sister-in-law called 911, and she was hospitalized. She did not perceive herself to be under stress at the time.  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 She experienced dizziness and elevated blood pressure, leading to another hospitalization. Her EKG was abnormal, and she received nitroglycerin , which provided relief. A cardiac catheterization showed no blockages, and her ejection fraction improved from 30% to 55% the next day.  She has been taken off isosorbide and reports feeling better since stopping it. She is currently on amlodipine  2.5 mg daily and losartan  25 mg daily.  She is working with a Warden/ranger to address PTSD and anxiety related to her episodes. She is not taking Seroquel. She experiences difficulty sleeping and is considering trazodone for insomnia.  She has a history of hypertension and was previously on cholesterol medication, which has been stopped. She is not currently on any cholesterol medication.  She retired in 2020 but occasionally works as an interim principal. 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  She experiences fatigue, dizziness, and difficulty sleeping.          Problem List Takotsubo Cardiomyopathy  -Dx 03/2024 -Dx 07/2024 @ [facility] 2. Non-obstructive CAD  3. HLD -T chol 234, HDL 77, LDL 139, TG 102 4. HTN    ROS: All other ROS reviewed and negative. Pertinent positives noted in the HPI.     Studies Reviewed: SABRA       LHC 03/14/2024 1.  Minimal nonobstructive coronary artery disease with no significant stenoses 2.  Severe segmental LV dysfunction with periapical ballooning, hyperdynamic basal segments, LVEF estimated at 35%, findings consistent with acute Takotsubo syndrome  CMR 09/03/2024 IMPRESSION: 1. Normal left ventricular systolic function (LVEF =60%). LVEF has recovered from prior reporting.   2. Normal right ventricular systolic function (RVEF =58%).   3. Qualitatively, there is mild, central regurgitation. Regurgitant fraction 5%. Physical Exam:   VS:  BP 106/71   Pulse 67   Ht 5' 6 (1.676 m)   SpO2 99%   BMI 26.86 kg/m    Wt Readings from Last 3 Encounters:  08/26/24 166 lb 6.4 oz (75.5 kg)  08/05/24 163 lb (73.9 kg)  07/24/24 155 lb (70.3 kg)    GEN: Well nourished, well developed in no acute distress NECK: No JVD; No carotid bruits CARDIAC: RRR, no murmurs, rubs, gallops RESPIRATORY:  Clear to auscultation without rales, wheezing or rhonchi  ABDOMEN: Soft, non-tender, non-distended EXTREMITIES:  No edema; No deformity  ASSESSMENT AND PLAN: .   Assessment and Plan    Recurrent stress-induced (Takotsubo) cardiomyopathy Recurrent episodes linked to stress, with no coronary  artery disease or myocardial damage. Likely catecholamine-related. Risk of recurrence exists, though a third episode is rare. - Advise avoidance of stressful situations. - Continue working with a therapist for mental health support. - Discontinue isosorbide due to dizziness and fogginess. - Continue amlodipine  2.5 mg daily. - Continue losartan   25 mg daily. - Discontinue aspirin . - Discontinue cholesterol medication.  Hypertension Low blood pressure possibly due to current medication regimen. Fatigue may be medication-related. - Discontinue hydrochlorothiazide . - Continue amlodipine  2.5 mg daily. - Continue losartan  25 mg daily.  Insomnia Difficulty sleeping likely related to stress and anxiety from cardiomyopathy and PTSD. - Prescribe trazodone 25 mg as needed for sleep.         Follow-up: Return in about 6 months (around 03/06/2025).   Signed, Sherry Daniels. Barbaraann, MD, [facility]  [facility]  [address] [place], [place] [address] [phone]  3:00 PM

## 2024-09-05 ENCOUNTER — Ambulatory Visit: Attending: Cardiovascular Disease | Admitting: Cardiovascular Disease

## 2024-09-05 ENCOUNTER — Encounter: Payer: Self-pay | Admitting: Cardiovascular Disease

## 2024-09-05 VITALS — BP 106/71 | HR 67 | Ht 66.0 in

## 2024-09-05 DIAGNOSIS — I2510 Atherosclerotic heart disease of native coronary artery without angina pectoris: Secondary | ICD-10-CM

## 2024-09-05 DIAGNOSIS — E782 Mixed hyperlipidemia: Secondary | ICD-10-CM

## 2024-09-05 DIAGNOSIS — I428 Other cardiomyopathies: Secondary | ICD-10-CM | POA: Diagnosis not present

## 2024-09-05 DIAGNOSIS — F5101 Primary insomnia: Secondary | ICD-10-CM

## 2024-09-05 MED ORDER — TRAZODONE HCL 50 MG PO TABS: 25.0000 mg | ORAL_TABLET | Freq: Every day | ORAL | 2 refills | Status: AC

## 2024-09-05 MED ORDER — LOSARTAN POTASSIUM 25 MG PO TABS: 25.0000 mg | ORAL_TABLET | Freq: Every day | ORAL | 3 refills | Status: AC

## 2024-09-05 NOTE — Patient Instructions (Addendum)
 Medication Instructions:  Your physician has recommended you make the following change in your medication:  STOP Aspirin  STOP Hydrochlorothiazide  STOP Crestor  TAKE Trazadone 25 mg every night at bedtime TAKE Losartan  25 mg daily  *If you need a refill on your cardiac medications before your next appointment, please call your pharmacy*  Lab Work: None ordered  If you have any lab test that is abnormal or we need to change your treatment, we will call you to review the results.  Testing/Procedures: None ordered  Follow-Up: At [facility], you and your health needs are our priority.  As part of our continuing mission to provide you with exceptional heart care, our providers are all part of one team.  This team includes your primary Cardiologist (physician) and Advanced Practice Providers or APPs (Physician Assistants and Nurse Practitioners) who all work together to provide you with the care you need, when you need it.  Your next appointment:   6 month(s)  Provider:   Darryle ONEIDA Decent, MD     Thank you for choosing [facility]!!   [phone]

## 2024-09-09 ENCOUNTER — Telehealth (HOSPITAL_COMMUNITY): Payer: Self-pay | Admitting: *Deleted

## 2024-09-09 NOTE — Telephone Encounter (Signed)
-----   Message from Rosabel BIRCH [place] sent at 09/09/2024  1:21 PM EDT ----- Regarding: RE: cardiac rehab She can resume cardiac rehab. Thanks, Katlyn West, NP ----- Message ----- From: Cyrus Hadassah ORN, RN Sent: 09/09/2024   9:17 AM EDT To: Katlyn D West, NP Subject: cardiac rehab                                  Good morning Katlyn,  Is it okay for Mrs German to proceed with exercise at cardiac rehab? Dr Barbaraann saw her on 09/05/24  I appreciate your input!  Sincerely, [facility] Cardiac Rehab

## 2024-09-09 NOTE — Telephone Encounter (Signed)
 Reached out to patient. She states she forgot to speak with the cardiologist about the PLQ due to discussing a cardiac MRI. Patient states she will send a message through my chart to ask the cardiologist about the PLQ. Patient will notify the office.

## 2024-09-12 ENCOUNTER — Telehealth (HOSPITAL_COMMUNITY): Payer: Self-pay

## 2024-09-12 NOTE — Telephone Encounter (Signed)
 Attempted to call patient to schedule cardiac rehab- no answer, unable to leave message. Sent MyChart message.

## 2024-09-12 NOTE — Telephone Encounter (Signed)
 Pt insurance is active and benefits verified through [facility]. Co-pay $0, DED $1,500/$1,500 met, out of pocket $5,900/$5,900 met, co-insurance 30%. No pre-authorization required. 09/12/2024 @ 1:07pm, spoke with Mia, REF# [id number].  TCR/ICR? ICR Visit(date of service)limitation? No Can multiple codes be used on the same date of service/visit?(IF ITS A LIMIT) N/A  Is this a lifetime maximum or an annual maximum? Annual Has the member used any of these services to date? No Is there a time limit (weeks/months) on start of program and/or program completion? No

## 2024-09-16 ENCOUNTER — Telehealth (HOSPITAL_COMMUNITY): Payer: Self-pay

## 2024-09-16 NOTE — Telephone Encounter (Signed)
 Patient called back to get scheduled in the Cardiac Rehab Program. Patient will come in for orientation on 11/13 and will attend the 10:15 exercise class.  Sent MyChart message.

## 2024-09-17 ENCOUNTER — Other Ambulatory Visit: Payer: Self-pay | Admitting: Obstetrics & Gynecology

## 2024-09-17 DIAGNOSIS — R928 Other abnormal and inconclusive findings on diagnostic imaging of breast: Secondary | ICD-10-CM

## 2024-09-18 NOTE — Telephone Encounter (Signed)
 Attempted to contact patient and see if she spoke with cardiologist about the PLQ, message states call could not be completed at this time.

## 2024-09-22 ENCOUNTER — Emergency Department (HOSPITAL_COMMUNITY)
Admission: EM | Admit: 2024-09-22 | Discharge: 2024-09-22 | Disposition: A | Discharge status: Home / Self Care | Attending: Emergency Medicine | Admitting: Emergency Medicine

## 2024-09-22 ENCOUNTER — Other Ambulatory Visit: Payer: Self-pay

## 2024-09-22 ENCOUNTER — Encounter (HOSPITAL_COMMUNITY): Payer: Self-pay

## 2024-09-22 ENCOUNTER — Emergency Department (HOSPITAL_COMMUNITY)

## 2024-09-22 DIAGNOSIS — R079 Chest pain, unspecified: Secondary | ICD-10-CM | POA: Diagnosis present

## 2024-09-22 DIAGNOSIS — R519 Headache, unspecified: Secondary | ICD-10-CM | POA: Diagnosis not present

## 2024-09-22 DIAGNOSIS — E876 Hypokalemia: Secondary | ICD-10-CM | POA: Diagnosis not present

## 2024-09-22 DIAGNOSIS — R072 Precordial pain: Secondary | ICD-10-CM | POA: Insufficient documentation

## 2024-09-22 LAB — BRAIN NATRIURETIC PEPTIDE: B Natriuretic Peptide: 84.8 pg/mL (ref 0.0–100.0)

## 2024-09-22 LAB — CBC WITH DIFFERENTIAL/PLATELET
Abs Immature Granulocytes: 0.01 K/uL (ref 0.00–0.07)
Basophils Absolute: 0 K/uL (ref 0.0–0.1)
Basophils Relative: 1 %
Eosinophils Absolute: 0.1 K/uL (ref 0.0–0.5)
Eosinophils Relative: 3 %
HCT: 36.9 % (ref 36.0–46.0)
Hemoglobin: 12.1 g/dL (ref 12.0–15.0)
Immature Granulocytes: 0 %
Lymphocytes Relative: 53 %
Lymphs Abs: 2.1 K/uL (ref 0.7–4.0)
MCH: 29.9 pg (ref 26.0–34.0)
MCHC: 32.8 g/dL (ref 30.0–36.0)
MCV: 91.1 fL (ref 80.0–100.0)
Monocytes Absolute: 0.3 K/uL (ref 0.1–1.0)
Monocytes Relative: 8 %
Neutro Abs: 1.4 K/uL — ABNORMAL LOW (ref 1.7–7.7)
Neutrophils Relative %: 35 %
Platelets: 214 K/uL (ref 150–400)
RBC: 4.05 MIL/uL (ref 3.87–5.11)
RDW: 13.2 % (ref 11.5–15.5)
WBC: 3.9 K/uL — ABNORMAL LOW (ref 4.0–10.5)
nRBC: 0 % (ref 0.0–0.2)

## 2024-09-22 LAB — TROPONIN I (HIGH SENSITIVITY)
Troponin I (High Sensitivity): 5 ng/L (ref <18)
Troponin I (High Sensitivity): 5 ng/L (ref <18)

## 2024-09-22 LAB — BASIC METABOLIC PANEL WITH GFR
Anion gap: 9 (ref 5–15)
BUN: <5 mg/dL — ABNORMAL LOW (ref 8–23)
CO2: 25 mmol/L (ref 22–32)
Calcium: 9.3 mg/dL (ref 8.9–10.3)
Chloride: 104 mmol/L (ref 98–111)
Creatinine, Ser: 0.82 mg/dL (ref 0.44–1.00)
GFR, Estimated: >60 mL/min (ref >60)
Glucose, Bld: 115 mg/dL — ABNORMAL HIGH (ref 70–99)
Potassium: 3.2 mmol/L — ABNORMAL LOW (ref 3.5–5.1)
Sodium: 138 mmol/L (ref 135–145)

## 2024-09-22 MED ORDER — POTASSIUM CHLORIDE CRYS ER 20 MEQ PO TBCR
40.0000 meq | EXTENDED_RELEASE_TABLET | Freq: Once | ORAL | Status: AC
  Administered 2024-09-22: 40 meq via ORAL
  Filled 2024-09-22: qty 2

## 2024-09-22 MED ORDER — MECLIZINE HCL 25 MG PO TABS
25.0000 mg | ORAL_TABLET | Freq: Once | ORAL | Status: AC
  Administered 2024-09-22: 25 mg via ORAL
  Filled 2024-09-22: qty 1

## 2024-09-22 NOTE — ED Provider Triage Note (Signed)
 Emergency Medicine Provider Triage Evaluation Note  Sherry Daniels , a 63 y.o. female  was evaluated in triage.  Pt complains of headache, chest pain.  Earlier today she felt lightheaded, dizziness had an odd sensation to the center of her chest.  Felt lightheaded.  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  Took a nitroglycerin  at home which helped slightly.  No numbness or unilateral weakness   Review of Systems  Positive: CP, HA, HTN Negative: Numbness, weakness  Physical Exam  BP (!) 175/85   Pulse 98   Temp 97.8 F (36.6 C)   Resp 20   SpO2 100%  Gen:   Awake, no distress   Resp:  Normal effort  MSK:   Moves extremities without difficulty  Other:  Cn 2-12 grossly intact, equal hand grip, intact sensation  Medical Decision Making  Medically screening exam initiated at 7:57 PM.  Appropriate orders placed.  Sherry Daniels was informed that the remainder of the evaluation will be completed by another provider, this initial triage assessment does not replace that evaluation, and the importance of remaining in the ED until their evaluation is complete.  HA, CP, HTN  No code stroke   Sherry Daniels A, PA-C 09/22/24 1957

## 2024-09-22 NOTE — Discharge Instructions (Addendum)
 It was a pleasure to care of you here today.  Your workup today was reassuring.  I would make sure to follow-up outpatient, return for any worsening symptoms

## 2024-09-22 NOTE — ED Triage Notes (Signed)
 Pt is coming in for a sudden onset of feeling weakness, feeling faint, nauseated, and now having mild chest pain. 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 She is otherwise stable but feels weak and faint.

## 2024-09-22 NOTE — ED Provider Notes (Addendum)
 10:24 PM Patient signed out to me by PA team.   Pt presenting for headache, dizziness, and chest pain. 12 lead ecg stable. Initial trop 5. Cxr stable. Electrolytes stable.    Physical Exam  BP (!) 175/85   Pulse 98   Temp 97.8 F (36.6 C)   Resp 20   SpO2 100%   Physical Exam  Procedures  Procedures  ED Course / MDM    Medical Decision Making Amount and/or Complexity of Data Reviewed Labs: ordered. Radiology: ordered.  Risk Prescription drug management.   ECG stable. Troponin 5 and 5. Patient's symptoms improving. bP was 175/85 on arrival and now down to 141/85. Pt took nitroglycerin  and her night dose of amlodipine  just prior to arrival.  Will defer to Dr. Barbaraann for further recs on bp. Current bp stable and symptoms are stable. Pt was just seen by Dr. Barbaraann on 10/24 and taken off the hydrochlorothiazide . Recommending a bp log at this time and f/u this week.   Patient in no distress and overall condition improved here in the ED. Detailed discussions were had with the patient regarding current findings, and need for close f/u with PCP or on call doctor. The patient has been instructed to return immediately if the symptoms worsen in any way for re-evaluation. Patient verbalized understanding and is in agreement with current care plan. All questions answered prior to discharge.     Elnor Bernarda SQUIBB, DO 09/22/24 2318    Elnor Bernarda SQUIBB, DO 09/22/24 2325

## 2024-09-22 NOTE — ED Provider Notes (Signed)
 [facility] AT [facility] Provider Note   CSN: [id number] Arrival date & time: 09/22/24  8063     Patient presents with: Weakness   Sherry Daniels is a 63 y.o. female here for evaluation of headache, dizziness, chest pain.  Earlier today states she felt lightheaded, dizzy.  Has a difficult time describing this.  She also felt an odd sensation in center of her chest.  Did not radiate to her back.  She was concerned when she took her blood pressure which was elevated at home.  She took a home nitroglycerin .  No slurred speech, numbness, unilateral weakness, back pain, abdominal pain, vomiting.    History of Takotsubo cardiomyopathy, no history of CAD. Follows with [facility] cards   HPI     Prior to Admission medications   Medication Sig Start Date End Date Taking? Authorizing Provider  acetaminophen  (TYLENOL ) 500 MG tablet Take 1,500 mg by mouth every 6 (six) hours as needed for mild pain (pain score 1-3). Reported on 05/01/2016    [provider]  albuterol  (PROVENTIL ) (2.5 MG/3ML) 0.083% nebulizer solution Take 3 mLs (2.5 mg total) by nebulization every 6 (six) hours as needed for wheezing or shortness of breath. 11/16/21   Chandra Harlene LABOR, NP  albuterol  (VENTOLIN  HFA) 108 (90 Base) MCG/ACT inhaler USE 2 PUFFS EVERY 4 TO 6 HOURS AS NEEDED 05/24/20   Duanne Butler DASEN, MD  ALPRAZolam  (XANAX ) 0.25 MG tablet Take 1 tablet (0.25 mg total) by mouth 2 (two) times daily as needed for anxiety. Patient not taking: Reported on 09/05/2024 07/04/24   Paseda, Folashade R, FNP  amLODipine  (NORVASC ) 2.5 MG tablet Take 1 tablet (2.5 mg total) by mouth daily. 08/11/24 11/09/24  West, Katlyn D, NP  estradiol (ESTRACE) 0.1 MG/GM vaginal cream Place 1 g vaginally 3 (three) times a week. Apply 1 application on Monday, Wednesdays, and Fridays per patient 12/27/21   [provider]  estradiol (VIVELLE-DOT) 0.1 MG/24HR patch Place 1 patch onto the skin 2 (two)  times a week. 11/15/23   [provider]  losartan  (COZAAR ) 25 MG tablet Take 1 tablet (25 mg total) by mouth daily. 09/05/24 12/04/24  O'NealDarryle Ned, MD  Misc Natural Products (TART CHERRY ADVANCED PO) Take 1 tablet by mouth daily.    [provider]  Multiple Vitamins-Minerals (MULTIVITAMIN WITH MINERALS) tablet Take 1 tablet by mouth daily.    [provider]  nitroGLYCERIN  (NITROSTAT ) 0.3 MG SL tablet Place 0.3 mg under the tongue every 5 (five) minutes as needed for chest pain. 07/22/24 07/22/25  [provider]  Omega-3 1000 MG CAPS Take 1 capsule by mouth daily.    [provider]  promethazine  (PHENERGAN ) 12.5 MG tablet Take 1 tablet (12.5 mg total) by mouth every 8 (eight) hours as needed for nausea or vomiting. 09/30/21   Chandra Harlene LABOR, NP  sertraline  (ZOLOFT ) 25 MG tablet TAKE 1 TABLET (25 MG TOTAL) BY MOUTH DAILY. 06/13/24   Paseda, Folashade R, FNP  SUMAtriptan  (IMITREX ) 50 MG tablet Take 1 tablet (50 mg total) by mouth 2 (two) times daily as needed for migraine. May repeat in 2 hours if headache persists or recurs. 01/23/24   Reddick, Johnathan B, NP  traZODone (DESYREL) 50 MG tablet Take 0.5 tablets (25 mg total) by mouth at bedtime. 09/05/24   O'NealDarryle Ned, MD  TURMERIC PO Take 1 tablet by mouth daily at 6 (six) AM.    [provider]  Vitamin D ,  Ergocalciferol , (DRISDOL ) 1.25 MG (50000 UNIT) CAPS capsule Take 1 capsule (50,000 Units total) by mouth every 7 (seven) days. Patient not taking: Reported on 09/05/2024 02/27/24   Cheryl Waddell HERO, PA-C    Allergies: Aspirin , Codeine, Cymbalta  [duloxetine  hcl], and Simvastatin    Review of Systems  Constitutional: Negative.   HENT: Negative.    Respiratory:  Positive for shortness of breath.   Cardiovascular:  Positive for chest pain. Negative for palpitations and leg swelling.  Gastrointestinal: Negative.   Genitourinary: Negative.   Musculoskeletal: Negative.   Skin:  Negative.   Neurological:  Positive for dizziness and headaches. Negative for tremors, seizures, syncope, facial asymmetry, speech difficulty, weakness, light-headedness and numbness.  All other systems reviewed and are negative.   Updated Vital Signs BP (!) 175/85   Pulse 98   Temp 97.8 F (36.6 C)   Resp 20   SpO2 100%   Physical Exam Vitals and nursing note reviewed.  Constitutional:      General: She is not in acute distress.    Appearance: She is well-developed. She is not ill-appearing, toxic-appearing or diaphoretic.  HENT:     Head: Normocephalic and atraumatic.     Nose: Nose normal.     Mouth/Throat:     Mouth: Mucous membranes are moist.  Eyes:     Pupils: Pupils are equal, round, and reactive to light.  Cardiovascular:     Rate and Rhythm: Normal rate.     Pulses: Normal pulses.     Heart sounds: Normal heart sounds.  Pulmonary:     Effort: Pulmonary effort is normal. No respiratory distress.     Breath sounds: Normal breath sounds.  Abdominal:     General: Bowel sounds are normal. There is no distension.     Palpations: Abdomen is soft.     Tenderness: There is no abdominal tenderness. There is no right CVA tenderness, left CVA tenderness, guarding or rebound.  Musculoskeletal:        General: No swelling, tenderness, deformity or signs of injury. Normal range of motion.     Cervical back: Normal range of motion.     Right lower leg: No edema.     Left lower leg: No edema.  Skin:    General: Skin is warm and dry.     Capillary Refill: Capillary refill takes less than 2 seconds.  Neurological:     General: No focal deficit present.     Mental Status: She is alert.     Cranial Nerves: No cranial nerve deficit.     Sensory: No sensory deficit.     Motor: No weakness.  Psychiatric:        Mood and Affect: Mood normal.     (all labs ordered are listed, but only abnormal results are displayed) Labs Reviewed  CBC WITH DIFFERENTIAL/PLATELET - Abnormal;  Notable for the following components:      Result Value   WBC 3.9 (*)    Neutro Abs 1.4 (*)    All other components within normal limits  BASIC METABOLIC PANEL WITH GFR - Abnormal; Notable for the following components:   Potassium 3.2 (*)    Glucose, Bld 115 (*)    BUN <5 (*)    All other components within normal limits  BRAIN NATRIURETIC PEPTIDE  TROPONIN I (HIGH SENSITIVITY)  TROPONIN I (HIGH SENSITIVITY)    EKG: None  Radiology: CT Head Wo Contrast Result Date: 09/22/2024 EXAM: CT HEAD WITHOUT CONTRAST 09/22/2024 09:30:04 PM TECHNIQUE: CT  of the head was performed without the administration of intravenous contrast. Automated exposure control, iterative reconstruction, and/or weight based adjustment of the mA/kV was utilized to reduce the radiation dose to as low as reasonably achievable. COMPARISON: CT head dated 07/24/2024. CLINICAL HISTORY: Headache, increasing frequency or severity. FINDINGS: BRAIN AND VENTRICLES: No acute hemorrhage. No evidence of acute infarct. No hydrocephalus. No extra-axial collection. No mass effect or midline shift. ORBITS: No acute abnormality. SINUSES: No acute abnormality. SOFT TISSUES AND SKULL: No acute soft tissue abnormality. No skull fracture. IMPRESSION: 1. No acute intracranial abnormality. Electronically signed by: Morgane Naveau MD 09/22/2024 09:38 PM EST RP Workstation: [id number]   DG Chest 2 View Result Date: 09/22/2024 EXAM: 2 VIEW(S) XRAY OF THE CHEST 09/22/2024 08:24:00 PM COMPARISON: 07/24/2024 CLINICAL HISTORY: sob FINDINGS: LUNGS AND PLEURA: No focal pulmonary opacity. No pleural effusion. No pneumothorax. HEART AND MEDIASTINUM: No acute abnormality of the cardiac and mediastinal silhouettes. BONES AND SOFT TISSUES: No acute osseous abnormality. IMPRESSION: 1. No acute cardiopulmonary process. Electronically signed by: Oneil Devonshire MD 09/22/2024 08:30 PM EST RP Workstation: [id number]     Procedures   Medications Ordered in the ED   potassium chloride  SA (KLOR-CON  M) CR tablet 40 mEq (has no administration in time range)  meclizine (ANTIVERT) tablet 25 mg (25 mg Oral Given 09/22/24 1089)   63 year old here for evaluation of lightheadedness, dizziness, chest pain and headache.  Started earlier today. She was concerned when she checked her blood pressure at home which was significantly elevated.  Here she has a nonfocal neuroexam.  Has a difficult time describing the dizziness.  States her chest feels off.  No radiation of symptoms, associated diaphoresis, nausea or vomiting.  Will plan on labs, imaging, reassess will try meclizine for her dizziness.  Labs and imaging personally viewed and interpreted:  CBC leukopenia 3.9 Metabolic panel potassium 3.2 will supplement Troponin 5 BNP 84 Chest x-ray without cardiomegaly, pulm edema, pneumothorax, infiltrate EKG without ischemic changes CT head wo acute changes  Patient reassessed. Feels improved. Denies any current HA.   Care transferred to Dr. Elnor who will follow-up on remaining labs and imaging as well as reevaluation of dizziness.                                  Medical Decision Making Amount and/or Complexity of Data Reviewed Independent Historian: spouse External Data Reviewed: labs, radiology, ECG and notes. Labs: ordered. Decision-making details documented in ED Course. Radiology: ordered and independent interpretation performed. Decision-making details documented in ED Course. ECG/medicine tests: ordered and independent interpretation performed. Decision-making details documented in ED Course.  Risk OTC drugs. Decision regarding hospitalization. Diagnosis or treatment significantly limited by social determinants of health.        Final diagnoses:  Hypokalemia  Precordial pain    ED Discharge Orders     None          Isaih Bulger A, PA-C 09/22/24 2201    Elnor Bernarda SQUIBB, DO 09/22/24 2217

## 2024-09-23 ENCOUNTER — Ambulatory Visit
Admission: RE | Admit: 2024-09-23 | Discharge: 2024-09-23 | Disposition: A | Source: Ambulatory Visit | Attending: Obstetrics & Gynecology | Admitting: Obstetrics & Gynecology

## 2024-09-23 DIAGNOSIS — R928 Other abnormal and inconclusive findings on diagnostic imaging of breast: Secondary | ICD-10-CM

## 2024-09-23 NOTE — Telephone Encounter (Signed)
 Contacted the patient and inquired if she has spoken to her cardiologist about the PLQ yet. Patient states she has been sick. Patient states she will be uploading a document to mychart for her cardiologist and will be sending them a mychart message as well. Advised the patient to contact us  once she has done so and is waiting for a response. Patient states she will call us  or send a mychart message. Patient verbalized understanding.

## 2024-09-24 ENCOUNTER — Other Ambulatory Visit: Payer: Self-pay | Admitting: Obstetrics & Gynecology

## 2024-09-24 ENCOUNTER — Telehealth (HOSPITAL_COMMUNITY): Payer: Self-pay | Admitting: *Deleted

## 2024-09-24 DIAGNOSIS — R921 Mammographic calcification found on diagnostic imaging of breast: Secondary | ICD-10-CM

## 2024-09-24 NOTE — Telephone Encounter (Signed)
 Spoke with Sherry Daniels she would like to hold off on coming to orientation tomorrow morning. Sherry Daniels has another appointment tomorrow at 12;30. Sherry Daniels would like to hold off until she receives her results and tentatively reschedule after Tuesday 09/30/24. Will cancel tomorrow's orientation and exercise appointments at this time.Hadassah Elpidio Quan RN BSN

## 2024-09-25 ENCOUNTER — Telehealth (HOSPITAL_COMMUNITY): Payer: Self-pay | Admitting: *Deleted

## 2024-09-25 ENCOUNTER — Encounter

## 2024-09-25 ENCOUNTER — Ambulatory Visit
Admission: RE | Admit: 2024-09-25 | Discharge: 2024-09-25 | Disposition: A | Discharge status: Home / Self Care | Source: Ambulatory Visit | Attending: Obstetrics & Gynecology | Admitting: Obstetrics & Gynecology

## 2024-09-25 ENCOUNTER — Encounter (HOSPITAL_COMMUNITY): Admission: RE | Admit: 2024-09-25 | Source: Ambulatory Visit

## 2024-09-25 ENCOUNTER — Ambulatory Visit
Admission: RE | Admit: 2024-09-25 | Discharge: 2024-09-25 | Disposition: A | Source: Ambulatory Visit | Attending: Obstetrics & Gynecology | Admitting: Obstetrics & Gynecology

## 2024-09-25 DIAGNOSIS — R921 Mammographic calcification found on diagnostic imaging of breast: Secondary | ICD-10-CM

## 2024-09-25 HISTORY — PX: BREAST BIOPSY: SHX20

## 2024-09-25 NOTE — Telephone Encounter (Signed)
-----   Message from Darryle DASEN Story sent at 09/24/2024  6:07 PM EST ----- Regarding: RE: Sherry Daniels to proceed with Cardiac rehab brief er visit on 11/10 [facility] for rehab.  -W ----- Message ----- From: Bernett Saturnino NOVAK, RN Sent: 09/24/2024   4:30 PM EST To: Darryle Debby Decent, MD; Hadassah LELON Quan, # Subject: Sherry Daniels to proceed with Cardiac rehab brief er vi#   Dr. Decent,  The above pt scheduled to start cardiac rehab s/p Nstemi 5/2.  Last seen by you on 10/24 and 10/16 by [facility] Cardiologist.  Pt with brief ER visit  on 11/10 for headache dizziness and chest pain.  BP elevated 175/85.   ECG stable, Toponin 5 bp improved 141/85 (pt took ntg and her night dose of amlodipine  prior to arrival) Advised to keep bp log and follow up.  Scheduled to see you on 12/18.  Ok to proceed with cardiac rehab prior to follow up?  Or wait to be seen on 12/18?  Scheduled for 11/13 - 8:00am  Maleeha Halls Bernett RN, BSN Cardiac and Pulmonary Rehab Nurse Navigator ----- Message ----- From: Bernett Saturnino NOVAK, RN Sent: 09/24/2024   4:29 PM EST To: Darryle Debby Decent, MD Subject: Sherry Daniels to proceed with Cardiac rehab brief er vi#   Dr. Decent,  The above pt scheduled to start cardiac rehab s/p Nstemi 5/2.  Last seen by you on 10/24 and 10/16 by [facility] Cardiologist.  Pt with brief ER visit  on 11/10 for headache dizziness and chest pain.  BP elevated 175/85.   ECG stable, Toponin 5 bp improved 141/85 (pt took ntg and her night dose of amlodipine  prior to arrival) Advised to keep bp log and follow up.  Scheduled to see you on 12/18.  Ok to proceed with cardiac rehab prior to follow up?  Or wait to be seen on 12/18?  Scheduled for 11/13 - 8:00am  Larra Crunkleton Bernett PEAK, BSN Cardiac and Pulmonary Rehab Nurse Navigator

## 2024-09-26 LAB — SURGICAL PATHOLOGY

## 2024-10-01 ENCOUNTER — Encounter (HOSPITAL_COMMUNITY)

## 2024-10-02 ENCOUNTER — Telehealth (HOSPITAL_COMMUNITY): Payer: Self-pay

## 2024-10-02 NOTE — Telephone Encounter (Signed)
 Attempted to reschedule cardiac rehab- no answer, unable to leave message. Sent MyChart message.

## 2024-10-03 ENCOUNTER — Encounter (HOSPITAL_COMMUNITY)

## 2024-10-06 ENCOUNTER — Encounter (HOSPITAL_COMMUNITY)

## 2024-10-08 ENCOUNTER — Encounter (HOSPITAL_COMMUNITY)

## 2024-10-13 ENCOUNTER — Encounter (HOSPITAL_COMMUNITY)

## 2024-10-15 ENCOUNTER — Encounter (HOSPITAL_COMMUNITY)

## 2024-10-16 ENCOUNTER — Telehealth (HOSPITAL_COMMUNITY): Payer: Self-pay

## 2024-10-16 NOTE — Telephone Encounter (Signed)
 F/u call regarding cardiac rehab- patient is still interested in cardiac rehab, would like to wait until January to schedule.

## 2024-10-17 ENCOUNTER — Encounter (HOSPITAL_COMMUNITY)

## 2024-10-20 ENCOUNTER — Encounter (HOSPITAL_COMMUNITY)

## 2024-10-20 NOTE — Progress Notes (Deleted)
  Cardiology Office Note:  .   Date:  10/20/2024  ID:  Sherry Daniels, DOB [birth year], MRN [id number] PCP: Paseda, Folashade R, FNP  [facility] Providers Cardiologist:  Darryle ONEIDA Decent, MD   History of Present Illness: .   No chief complaint on file.   Sherry Daniels is a 63 y.o. female with below history who presents for follow-up.   History of Present Illness               Problem List Takotsubo Cardiomyopathy  -Dx 03/2024 -Dx 07/2024 @ [facility] 2. Non-obstructive CAD  3. HLD -T chol 234, HDL 77, LDL 139, TG 102 4. HTN    ROS: All other ROS reviewed and negative. Pertinent positives noted in the HPI.     Studies Reviewed: SABRA       Physical Exam:   VS:  There were no vitals taken for this visit.   Wt Readings from Last 3 Encounters:  08/26/24 166 lb 6.4 oz (75.5 kg)  08/05/24 163 lb (73.9 kg)  07/24/24 155 lb (70.3 kg)    GEN: Well nourished, well developed in no acute distress NECK: No JVD; No carotid bruits CARDIAC: ***RRR, no murmurs, rubs, gallops RESPIRATORY:  Clear to auscultation without rales, wheezing or rhonchi  ABDOMEN: Soft, non-tender, non-distended EXTREMITIES:  No edema; No deformity  ASSESSMENT AND PLAN: .   Assessment and Plan              {The patient has an active order for outpatient cardiac rehabilitation.   Please indicate if the patient is ready to start. Do NOT delete this.  It will auto delete.  Refresh note, then sign.              Click here to document readiness and see contraindications.  :1}  Cardiac Rehabilitation Eligibility Assessment      {Are you ordering a CV Procedure (e.g. stress test, cath, DCCV, TEE, etc)?   Press F2        :[id number]}   Follow-up: No follow-ups on file.  Signed, Darryle ONEIDA. Decent, MD, [facility]  [facility]  [address] [place], [place] [address] [phone]  5:30 PM

## 2024-10-22 ENCOUNTER — Encounter (HOSPITAL_COMMUNITY)

## 2024-10-24 ENCOUNTER — Encounter (HOSPITAL_COMMUNITY)

## 2024-10-25 ENCOUNTER — Other Ambulatory Visit: Payer: Self-pay | Admitting: Nurse Practitioner

## 2024-10-27 ENCOUNTER — Encounter (HOSPITAL_COMMUNITY)

## 2024-10-29 ENCOUNTER — Encounter (HOSPITAL_COMMUNITY)

## 2024-10-30 ENCOUNTER — Ambulatory Visit: Admitting: Cardiovascular Disease

## 2024-10-30 DIAGNOSIS — I428 Other cardiomyopathies: Secondary | ICD-10-CM

## 2024-10-30 DIAGNOSIS — I2510 Atherosclerotic heart disease of native coronary artery without angina pectoris: Secondary | ICD-10-CM

## 2024-10-30 DIAGNOSIS — E782 Mixed hyperlipidemia: Secondary | ICD-10-CM

## 2024-10-31 ENCOUNTER — Encounter (HOSPITAL_COMMUNITY)

## 2024-11-03 ENCOUNTER — Encounter (HOSPITAL_COMMUNITY)

## 2024-11-05 ENCOUNTER — Encounter (HOSPITAL_COMMUNITY)

## 2024-11-10 ENCOUNTER — Encounter (HOSPITAL_COMMUNITY)

## 2024-11-12 ENCOUNTER — Encounter (HOSPITAL_COMMUNITY)

## 2024-11-13 NOTE — Progress Notes (Unsigned)
 "  Office Visit Note  Patient: Sherry Daniels             Date of Birth: Apr 07, 1961           MRN: [id number]             PCP: Paseda, Folashade R, FNP Referring: Paseda, Folashade R, FNP Visit Date: 11/26/2024 Occupation: Data Unavailable  Subjective:    History of Present Illness: Sherry Daniels is a 64 y.o. female with history of sjogren's syndrome.    [place]?   Activities of Daily Living:  Patient reports morning stiffness for *** {minute/hour:19697}.   Patient {ACTIONS;DENIES/REPORTS:21021675::Denies} nocturnal pain.  Difficulty dressing/grooming: {ACTIONS;DENIES/REPORTS:21021675::Denies} Difficulty climbing stairs: {ACTIONS;DENIES/REPORTS:21021675::Denies} Difficulty getting out of chair: {ACTIONS;DENIES/REPORTS:21021675::Denies} Difficulty using hands for taps, buttons, cutlery, and/or writing: {ACTIONS;DENIES/REPORTS:21021675::Denies}  No Rheumatology ROS completed.   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 Kristie    Fibromyalgia    GERD (gastroesophageal reflux disease)    Heart attack (HCC)    03/2024, 07/2024 per patient   Hyperlipidemia    Hypertension    Migraine    Non-ischemic cardiomyopathy (HCC) 03/25/2024   Nonalcoholic fatty liver disease     Family History  Problem Relation Age of Onset   Cancer Mother        stage III, colon   Hypertension Mother    Dementia Mother    Heart disease Mother        bradycardia   Stroke Mother    Cancer Father        prostate   Hypertension Father    Hyperlipidemia Father    Heart disease Father 2       MI    Hyperlipidemia Sister    Hypertension Sister    Fibromyalgia Sister    Arthritis Brother        RA   Hypertension Brother    Hyperlipidemia Brother    Fibromyalgia Brother    Hypertension Brother    Hyperlipidemia Brother    Healthy Daughter    Healthy Son    Lupus Niece    Past Surgical History:  Procedure Laterality Date   ABDOMINAL HYSTERECTOMY     total   BREAST BIOPSY Right 09/25/2024   MM RT BREAST BX W LOC DEV 1ST LESION IMAGE BX SPEC STEREO GUIDE 09/25/2024 [facility]   CARDIAC CATHETERIZATION  07/21/2024   COLONOSCOPY  09/15/2009   Dr. Kristie; normal   LEFT HEART CATH AND CORONARY ANGIOGRAPHY N/A 03/14/2024   Procedure: LEFT HEART CATH AND CORONARY ANGIOGRAPHY;  Surgeon: Wonda Sharper, MD;  Location: [facility]  INVASIVE CV LAB;  Service: Cardiovascular;  Laterality: N/A;   WISDOM TOOTH EXTRACTION     Social History[1] Social History   Social History Narrative   Married, 1 daughter and 1 son, pentecostal, exercise most days, walking, biking, running.    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   Physical Exam Vitals and nursing note reviewed.  Constitutional:      Appearance: She is well-developed.  HENT:      Head: Normocephalic and atraumatic.  Eyes:     Conjunctiva/sclera: Conjunctivae normal.  Cardiovascular:     Rate and Rhythm: Normal rate and regular rhythm.     Heart sounds: Normal heart sounds.  Pulmonary:     Effort: Pulmonary effort is normal.     Breath sounds: Normal breath sounds.  Abdominal:     General: Bowel sounds are normal.     Palpations: Abdomen is soft.  Musculoskeletal:     Cervical back: Normal range of motion.  Lymphadenopathy:     Cervical: No cervical adenopathy.  Skin:    General: Skin is warm and dry.     Capillary Refill: Capillary refill takes less than 2 seconds.  Neurological:     Mental Status: She is alert and oriented to person, place, and time.  Psychiatric:        Behavior: Behavior normal.      Musculoskeletal Exam: ***  CDAI Exam: CDAI Score: -- Patient Global: --; Provider Global: -- Swollen: --; Tender: -- Joint Exam 11/26/2024   No joint exam has been documented for this visit   There is currently no information documented on the homunculus. Go to the Rheumatology activity and complete the homunculus joint exam.  Investigation: No additional findings.  Imaging: No results found.  Recent Labs: Lab Results  Component Value Date   WBC 3.9 (L) 09/22/2024   HGB 12.1 09/22/2024   PLT 214 09/22/2024   NA 138 09/22/2024   K 3.2 (L) 09/22/2024   CL 104 09/22/2024   CO2 25 09/22/2024   GLUCOSE 115 (H) 09/22/2024   BUN <5 (L) 09/22/2024   CREATININE 0.82 09/22/2024   BILITOT 0.6 07/24/2024   ALKPHOS 57 07/24/2024   AST 20 08/26/2024   ALT 26 08/26/2024   PROT 7.5 07/24/2024   ALBUMIN 3.6 07/24/2024   CALCIUM  9.3 09/22/2024   GFRAA 119 05/03/2021    Speciality Comments: ANA negative, ENA negative, RF negative ANA initially was positive.  C1q positive, anti-CarP positive  Procedures:  No procedures performed Allergies: Aspirin , Codeine, Cymbalta  [duloxetine  hcl], Simvastatin, and Sulfa antibiotics   Assessment / Plan:      Visit Diagnoses: Sjogren's syndrome with other organ involvement  Positive ANA (antinuclear antibody)  High risk medication use  Elevated LFTs  Pain in both hands  Chondromalacia of patella, unspecified laterality  Pain in both feet  Fibromyalgia  Other fatigue  Vitamin D  deficiency  Essential hypertension  NSTEMI (non-ST elevated myocardial infarction) (HCC)  History of migraine  Gastroesophageal reflux disease without esophagitis  Nonalcoholic fatty liver disease  History of pleurisy  Pure hypercholesterolemia  Family history of rheumatoid arthritis- maternal grandmother  Orders: No orders of the defined types were placed in this encounter.  No orders of the defined types were placed in this encounter.   Face-to-face time spent with patient was ***  minutes. Greater than 50% of time was spent in counseling and coordination of care.  Follow-Up Instructions: No follow-ups on file.   Waddell CHRISTELLA Craze, PA-C  Note - This record has been created using Dragon software.  Chart creation errors have been sought, but may not always  have been located. Such creation errors do not reflect on  the standard of medical care.     [1]  Social History Tobacco Use   Smoking status: Never    Passive exposure: Never   Smokeless tobacco: Never  Vaping Use   Vaping status: Never Used  Substance Use Topics   Alcohol use: No    Alcohol/week: 0.0 standard drinks of alcohol   Drug use: No   "

## 2024-11-14 ENCOUNTER — Encounter (HOSPITAL_COMMUNITY)

## 2024-11-17 ENCOUNTER — Encounter (HOSPITAL_COMMUNITY)

## 2024-11-19 ENCOUNTER — Encounter (HOSPITAL_COMMUNITY)

## 2024-11-21 ENCOUNTER — Telehealth (HOSPITAL_COMMUNITY): Payer: Self-pay

## 2024-11-21 ENCOUNTER — Encounter (HOSPITAL_COMMUNITY)

## 2024-11-21 NOTE — Telephone Encounter (Signed)
 Pt insurance is active and benefits verified through [facility]. Co-pay $0, DED $3,000/$0 met, out of pocket $6,500/$10.01 met, co-insurance 30%. No pre-authorization required. 11/21/2024 @ 8:54am, spoke with Caden, REF# [id number].  TCR/ICR? ICR Visit(date of service)limitation? No Can multiple codes be used on the same date of service/visit?(IF ITS A LIMIT) N/A  Is this a lifetime maximum or an annual maximum? Annual Has the member used any of these services to date? No Is there a time limit (weeks/months) on start of program and/or program completion? No

## 2024-11-24 ENCOUNTER — Encounter (HOSPITAL_COMMUNITY)

## 2024-11-26 ENCOUNTER — Encounter (HOSPITAL_COMMUNITY)

## 2024-11-26 ENCOUNTER — Ambulatory Visit: Admitting: Physician Assistant

## 2024-11-26 DIAGNOSIS — K219 Gastro-esophageal reflux disease without esophagitis: Secondary | ICD-10-CM

## 2024-11-26 DIAGNOSIS — Z8709 Personal history of other diseases of the respiratory system: Secondary | ICD-10-CM

## 2024-11-26 DIAGNOSIS — I214 Non-ST elevation (NSTEMI) myocardial infarction: Secondary | ICD-10-CM

## 2024-11-26 DIAGNOSIS — R5383 Other fatigue: Secondary | ICD-10-CM

## 2024-11-26 DIAGNOSIS — Z8669 Personal history of other diseases of the nervous system and sense organs: Secondary | ICD-10-CM

## 2024-11-26 DIAGNOSIS — R7689 Other specified abnormal immunological findings in serum: Secondary | ICD-10-CM

## 2024-11-26 DIAGNOSIS — M797 Fibromyalgia: Secondary | ICD-10-CM

## 2024-11-26 DIAGNOSIS — I10 Essential (primary) hypertension: Secondary | ICD-10-CM

## 2024-11-26 DIAGNOSIS — M79671 Pain in right foot: Secondary | ICD-10-CM

## 2024-11-26 DIAGNOSIS — M2240 Chondromalacia patellae, unspecified knee: Secondary | ICD-10-CM

## 2024-11-26 DIAGNOSIS — M79641 Pain in right hand: Secondary | ICD-10-CM

## 2024-11-26 DIAGNOSIS — E7800 Pure hypercholesterolemia, unspecified: Secondary | ICD-10-CM

## 2024-11-26 DIAGNOSIS — K760 Fatty (change of) liver, not elsewhere classified: Secondary | ICD-10-CM

## 2024-11-26 DIAGNOSIS — Z8261 Family history of arthritis: Secondary | ICD-10-CM

## 2024-11-26 DIAGNOSIS — E559 Vitamin D deficiency, unspecified: Secondary | ICD-10-CM

## 2024-11-26 DIAGNOSIS — R7989 Other specified abnormal findings of blood chemistry: Secondary | ICD-10-CM

## 2024-11-26 DIAGNOSIS — Z79899 Other long term (current) drug therapy: Secondary | ICD-10-CM

## 2024-11-26 DIAGNOSIS — M3509 Sicca syndrome with other organ involvement: Secondary | ICD-10-CM

## 2024-11-28 ENCOUNTER — Encounter (HOSPITAL_COMMUNITY)

## 2024-12-01 ENCOUNTER — Encounter (HOSPITAL_COMMUNITY)

## 2024-12-03 ENCOUNTER — Encounter (HOSPITAL_COMMUNITY)

## 2024-12-05 ENCOUNTER — Encounter (HOSPITAL_COMMUNITY)

## 2024-12-05 ENCOUNTER — Telehealth (HOSPITAL_COMMUNITY): Payer: Self-pay

## 2024-12-05 NOTE — Telephone Encounter (Signed)
 Attempted to schedule cardiac rehab- no answer, left message. Sent MyChart message.

## 2024-12-08 ENCOUNTER — Encounter (HOSPITAL_COMMUNITY)

## 2024-12-10 ENCOUNTER — Encounter (HOSPITAL_COMMUNITY)

## 2024-12-12 ENCOUNTER — Encounter (HOSPITAL_COMMUNITY)

## 2024-12-15 ENCOUNTER — Encounter (HOSPITAL_COMMUNITY)

## 2024-12-17 ENCOUNTER — Encounter (HOSPITAL_COMMUNITY)

## 2024-12-19 ENCOUNTER — Encounter (HOSPITAL_COMMUNITY)
